# Patient Record
Sex: Male | Born: 1935 | Race: White | Hispanic: No | Marital: Married | Smoking: Former smoker
Health system: Southern US, Community
[De-identification: ages and names within clinical notes are randomized; demographics above are authoritative.]

## PROBLEM LIST (undated history)

## (undated) DIAGNOSIS — I83029 Varicose veins of left lower extremity with ulcer of unspecified site: Secondary | ICD-10-CM

## (undated) DIAGNOSIS — I83019 Varicose veins of right lower extremity with ulcer of unspecified site: Secondary | ICD-10-CM

## (undated) DIAGNOSIS — L97929 Non-pressure chronic ulcer of unspecified part of left lower leg with unspecified severity: Secondary | ICD-10-CM

## (undated) DIAGNOSIS — A389 Scarlet fever, uncomplicated: Secondary | ICD-10-CM

## (undated) DIAGNOSIS — L97919 Non-pressure chronic ulcer of unspecified part of right lower leg with unspecified severity: Secondary | ICD-10-CM

## (undated) DIAGNOSIS — A46 Erysipelas: Secondary | ICD-10-CM

## (undated) HISTORY — PX: TONSILLECTOMY: SHX5217

## (undated) HISTORY — DX: Erysipelas: A46

## (undated) HISTORY — DX: Scarlet fever, uncomplicated: A38.9

---

## 2005-06-02 ENCOUNTER — Emergency Department (HOSPITAL_COMMUNITY): Admission: EM | Admit: 2005-06-02 | Discharge: 2005-06-02 | Payer: Self-pay | Admitting: Emergency Medicine

## 2005-06-17 ENCOUNTER — Emergency Department (HOSPITAL_COMMUNITY): Admission: EM | Admit: 2005-06-17 | Discharge: 2005-06-17 | Payer: Self-pay | Admitting: Emergency Medicine

## 2012-01-01 ENCOUNTER — Telehealth: Payer: Self-pay | Admitting: Family Medicine

## 2012-01-01 ENCOUNTER — Encounter: Payer: Self-pay | Admitting: Family Medicine

## 2012-01-01 ENCOUNTER — Ambulatory Visit (INDEPENDENT_AMBULATORY_CARE_PROVIDER_SITE_OTHER): Payer: Self-pay | Admitting: Family Medicine

## 2012-01-01 VITALS — BP 120/70 | HR 68 | Temp 97.9°F | Ht 67.0 in | Wt 175.0 lb

## 2012-01-01 DIAGNOSIS — L0390 Cellulitis, unspecified: Secondary | ICD-10-CM

## 2012-01-01 DIAGNOSIS — R609 Edema, unspecified: Secondary | ICD-10-CM

## 2012-01-01 DIAGNOSIS — L0291 Cutaneous abscess, unspecified: Secondary | ICD-10-CM

## 2012-01-01 LAB — CBC WITH DIFFERENTIAL/PLATELET
HCT: 40.8 % (ref 39.0–52.0)
Hemoglobin: 13.7 g/dL (ref 13.0–17.0)
Lymphocytes Relative: 9 % — ABNORMAL LOW (ref 12–46)
MCV: 101.4 fL — ABNORMAL HIGH (ref 78.0–100.0)
Monocytes Absolute: 0.8 10*3/uL (ref 0.1–1.0)
Neutro Abs: 9.9 10*3/uL — ABNORMAL HIGH (ref 1.7–7.7)
RDW: 13.4 % (ref 11.5–15.5)
WBC: 11.7 10*3/uL — ABNORMAL HIGH (ref 4.0–10.5)

## 2012-01-01 LAB — BASIC METABOLIC PANEL
CO2: 26 mEq/L (ref 19–32)
Calcium: 9.2 mg/dL (ref 8.4–10.5)
Creat: 1 mg/dL (ref 0.50–1.35)
Potassium: 3.9 mEq/L (ref 3.5–5.3)
Sodium: 140 mEq/L (ref 135–145)

## 2012-01-01 MED ORDER — CEFTRIAXONE SODIUM 1 G IJ SOLR
1.0000 g | Freq: Once | INTRAMUSCULAR | Status: AC
  Administered 2012-01-01: 1 g via INTRAMUSCULAR

## 2012-01-01 MED ORDER — AMOXICILLIN-POT CLAVULANATE 875-125 MG PO TABS: 1.0000 | ORAL_TABLET | Freq: Two times a day (BID) | ORAL | Status: AC

## 2012-01-01 MED ORDER — FUROSEMIDE 20 MG PO TABS: 20.0000 mg | ORAL_TABLET | Freq: Every day | ORAL | Status: DC

## 2012-01-01 NOTE — Telephone Encounter (Signed)
Just an FYI

## 2012-01-01 NOTE — Patient Instructions (Signed)
You have a serious infection in your leg.  It might end up requiring hospitalization and intravenous antibiotics.  Per your request of avoiding the hospital, we will try to treat it with oral antibiotics and medication to decrease the swelling.  If your redness of leg spreads beyond the inked margins (that I drew), if you develop fevers, or vomiting and can't keep down the antibiotics, then you will need to go to the ER for treatment.  It is very important that you keep your legs elevated.  You seem to have a chronic problem with leg swelling (which contributes to the recurrent infections) and would benefit from wearing compression stockings on a regular basis.  You can get these from medical supply stores.  I would encourage you to get on Medicare, and to see a doctor on a regular basis for routine health surveillance (screening for diabetes, heart disease, immunizations, etc).  I am prescribing furosemide which is a diuretic to help get some of the fluid out of the leg.  Take a full tablet daily until the swelling is improved.  You may be able to cut back to just 1/2 tablet if you are having dizziness, low blood pressure, or when swelling has improved a lot.  Stop taking if/when swelling has completely resolved.  If your kidney function is normal (we are checking this today), then you may need to eat a banana daily in order to prevent your potassium from dropping too low.  If your blood count comes back extremely high, we may recommend that you go to the hospital for IV treatment--we will contact you with your results later today.   It is very important that you return here on Monday for a recheck of your leg, and to go to the ER sooner if the redness is spreading, fevers, etc.  Cellulitis Cellulitis is an infection of the skin and the tissue beneath it. The area is typically red and tender. It is caused by germs (bacteria) (usually staph or strep) that enter the body through cuts or sores. Cellulitis  most commonly occurs in the arms or lower legs.  HOME CARE INSTRUCTIONS   If you are given a prescription for medications which kill germs (antibiotics), take as directed until finished.   If the infection is on the arm or leg, keep the limb elevated as able.   Use a warm cloth several times per day to relieve pain and encourage healing.   See your caregiver for recheck of the infected site as directed if problems arise.   Only take over-the-counter or prescription medicines for pain, discomfort, or fever as directed by your caregiver.  SEEK MEDICAL CARE IF:   The area of redness (inflammation) is spreading, there are red streaks coming from the infected site, or if a part of the infection begins to turn dark in color.   The joint or bone underneath the infected skin becomes painful after the skin has healed.   The infection returns in the same or another area after it seems to have gone away.   A boil or bump swells up. This may be an abscess.   New, unexplained problems such as pain or fever develop.  SEEK IMMEDIATE MEDICAL CARE IF:   You have a fever.   You or your child feels drowsy or lethargic.   There is vomiting, diarrhea, or lasting discomfort or feeling ill (malaise) with muscle aches and pains.  MAKE SURE YOU:   Understand these instructions.   Will  watch your condition.   Will get help right away if you are not doing well or get worse.  Document Released: 06/11/2005 Document Revised: 08/21/2011 Document Reviewed: 04/19/2008 [facility] Patient Information 2012 [place], [place].

## 2012-01-01 NOTE — Telephone Encounter (Signed)
Called [facility] Care Management [phone] reached voice mail left order on phone for pt for Care Management advised that the pt does not have Medicare Insurance and please follow up with [place]

## 2012-01-01 NOTE — Telephone Encounter (Signed)
Patient called from [facility] , they only had Rx for him for Augmentin they have no record of Lasix.  Called Lasix in to pharmacist at [facility] per orders in chart

## 2012-01-01 NOTE — Progress Notes (Signed)
Chief complaint:  Possible cellulitis--started on 12/29/11, burning pain on his lower leg. (pt's ankles appear very swollen, left worse than right)   HPI:  While finishing his taxes on Monday (4/15), noticed increased swelling and pain in right leg (left leg has chronic swelling), and started draining a light yellow fluid.  Has been in bed with leg elevated since Monday--swelling improved some, but still draining. Pain improved some when leg elevated. Denies fevers.  Feet began swelling in 2006 after a head injury.  H/o infection in legs from rose bushes around the same time.  Has mostly involved his left leg. Gets leg infections every 1-2 years (usually in left leg).  Last saw a doctor in the fall, treated for a leg infection (erisypelas).   Doesn't have regular doctor, only sees for acute visit. Had tetanus vaccine in 2006 related to a head injury requiring staples.  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  Walks 4-6 miles daily most days (not when has a painful leg or in bad weather).  Denies chest pain, shortness of breath.  Past Medical History  Diagnosis Date  . Scarlet fever   . Erysipelas     Past Surgical History  Procedure Date  . Tonsillectomy     History   Social History  . Marital Status: Married    Spouse Name: N/A    Number of Children: N/A  . Years of Education: N/A   Occupational History  . retired from outside [employer]    Social History Main Topics  . Smoking status: Former Smoker    Quit date: 09/15/1962  . Smokeless tobacco: Never Used  . Alcohol Use: No  . Drug Use: No  . Sexually Active: Not on file   Other Topics Concern  . Not on file   Social History Narrative   Lives with wife.  2 children in [place], 1 in [place], 1 in [place]    Family History  Problem Relation Age of Onset  . Heart disease Mother   . Cancer Mother     leukemia  . Diabetes Neg Hx     Meds:  OTC vitamins and herbs.  No rx meds.  No Known  Allergies  ROS:  Denies headaches, fevers, URI symptoms, chest pain, shortness of breath, cough, abdominal complaints.  Denies nausea, vomiting, joint pains (just leg pain related to swelling/infection). Denies dizziness, syncope  PHYSICAL EXAM: BP 120/70  Pulse 68  Temp(Src) 97.9 F (36.6 C) (Oral)  Ht 5\' 7"  (1.702 m)  Wt 175 lb (79.379 kg)  BMI 27.41 kg/m2 Well appearing male, talkative, in no distress.  Very talkative, and a minimizer--"I know I'm going to be fine, it is going to get better" Neck: no lymphadenopathy or mass Heart: regular rate and rhythm without murmur Lungs: clear Abdomen: soft, nontender Extremities: 3+ edema, pitting with bright red skin, warm, and scabs/open areas on anterior shin. Erythema extends up to the knee anteriorly, but up the back of the posterior thigh.  Feet are warm with brisk capillary refill.  Left leg has 2+ edema, some chronic stasis changes with hyperpigmentation and dry flaky skin.  Some scabs present.  No erythema or warmth Neuro: alert and oriented, normal gait, grossly normal strength and cranial nerves Psych: normal affect, hygiene and grooming.  Somewhat unusual sense of humor, and uses a lot of medical terms in describe his history (not accurately).  ASSESSMENT/PLAN: 1. Cellulitis  amoxicillin-clavulanate (AUGMENTIN) 875-125 MG per tablet, CBC with Differential,  Basic metabolic panel, cefTRIAXone (ROCEPHIN) injection 1 g  2. Edema  furosemide (LASIX) 20 MG tablet   He has a very severe case of cellulitis, that based on severity and how quickly it developed, would likely best be treated with IV ABX.  Patient refuses to go to ER for treatment.  Therefore, discussed that we will do the best we can--IM Rocephin and Augmentin.  Edges of erythema marked with pen--advised him to go to ER if redness continues to spread, develops fever, or vomiting (NOT to wait for recheck Monday if worsening, but to go to hospital). Emphasized the potential risk for  worsening infection, including sepsis and death if we won't try to treat aggressively. Hopefully he will respond to treatment. CBC and b-met were checked stat today (if WBC extremely high, will try to encourage to go to hospital for IV ABX), and to see what underlying kidney function/electrolytes are in this 78 year old male with limited medical care.  WBC 11.7--okay to proceed with course as planned Electrolytes/kidney function normal.  F/u Monday, sooner prn

## 2012-01-05 ENCOUNTER — Ambulatory Visit (INDEPENDENT_AMBULATORY_CARE_PROVIDER_SITE_OTHER): Payer: Self-pay | Admitting: Family Medicine

## 2012-01-05 ENCOUNTER — Encounter: Payer: Self-pay | Admitting: Family Medicine

## 2012-01-05 ENCOUNTER — Telehealth: Payer: Self-pay | Admitting: Family Medicine

## 2012-01-05 VITALS — BP 102/60 | HR 68 | Temp 97.8°F | Resp 16 | Ht 67.0 in | Wt 172.0 lb

## 2012-01-05 DIAGNOSIS — L03119 Cellulitis of unspecified part of limb: Secondary | ICD-10-CM

## 2012-01-05 DIAGNOSIS — L02419 Cutaneous abscess of limb, unspecified: Secondary | ICD-10-CM

## 2012-01-05 NOTE — Telephone Encounter (Signed)
Michelle with [facility] called back and advised that they cannot help since the patient has no primary payer.  She recommends pt get in touch with Social Services.

## 2012-01-05 NOTE — Telephone Encounter (Signed)
Noted.  [facility] was the one who recommended we get them involved, to get a Child psychotherapist involved to get him assigned to Medicare.  At this point, is probably moot, since we advised pt of need for hospitalization, and that social workers would be in contact with him there.

## 2012-01-05 NOTE — Telephone Encounter (Signed)
Michelle with [facility] called this morning and stated that they normally do not take pt with out any insurance.  She is still trying to get in touch with patient and will try and assist him.

## 2012-01-05 NOTE — Patient Instructions (Signed)
Your infection has not responded to the antibiotics started last week. It is very important that you go to the hospital to get treated with IV antibiotics.  I have called ahead to [facility] ER to let them know you will be coming, and what needs to be done.  If this infection isn't treated, it can then spread to the bloodstream and cause death, so please follow through in getting this treated at hospital.  You will need to have social workers come speak to you while there to discuss insurance/Medicare.

## 2012-01-05 NOTE — Progress Notes (Signed)
Patient presents for f/u cellulitis RLL  He has been taking Augmentin twice daily, with only slight loose stools.  Keeping the leg elevated.  He reports the leg isn't weeping nearly as much. Has been taking the Lasix once daily, but not today yet.  Denies fevers.   Current Outpatient Prescriptions on File Prior to Visit  Medication Sig Dispense Refill  . amoxicillin-clavulanate (AUGMENTIN) 875-125 MG per tablet Take 1 tablet by mouth 2 (two) times daily.  20 tablet  0  . CINNAMON PO Take by mouth.      . furosemide (LASIX) 20 MG tablet Take 1 tablet (20 mg total) by mouth daily. Take only as needed for swelling.  If swelling significantly improved, but still present, cut back to 1/2 tablet daily.  Stop when swelling has resolved--use just as needed for recurrent swelling  15 tablet  0  . Misc Natural Product Nasal (EUPHORBIUM NA) Place into the nose. Patient states he takes the pellet form of this medication.      . Multiple Vitamin (MULTIVITAMIN) tablet Take 1 tablet by mouth daily.       No Known Allergies  ROS:  No fevers, vomting, allergic reaction.  PHYSICAL EXAM: BP 102/60  Pulse 68  Temp(Src) 97.8 F (36.6 C) (Oral)  Resp 16  Ht 5\' 7"  (1.702 m)  Wt 172 lb (78.019 kg)  BMI 26.94 kg/m2 Lower extremities: 3+ edema, with significant erythema/warmth.  Yellow crusting anteriorly, no active drainage.  No significant improvement from pen markings from last week.  ASSESSMENT/PLAN:  1. Cellulitis, leg    unchanged, not responding to oral ABX   Discussed need to go to ER and be admitted for IV antibiotics.  Will need social work consult, as pt needs to be on Medicare  Pt understands risks of noncompliance with this recommendation.

## 2012-01-07 ENCOUNTER — Emergency Department (HOSPITAL_COMMUNITY)
Admission: EM | Admit: 2012-01-07 | Discharge: 2012-01-07 | Disposition: A | Discharge status: Home / Self Care | Payer: Self-pay

## 2012-01-07 ENCOUNTER — Telehealth: Payer: Self-pay | Admitting: Family Medicine

## 2012-01-07 ENCOUNTER — Telehealth: Payer: Self-pay | Admitting: *Deleted

## 2012-01-07 NOTE — Telephone Encounter (Signed)
Received call from [facility] admitting, patient is there and states he was told we were calling to get a bed for him. Spoke to [place] and she states no that Dr. Lynelle Doctor has instructed patient to go to the ER not admitting. Informed gentleman at [facility] admitting and he asked me to talk with the patient. I informed patient that he needs to go to the ER not admitting and that the Er physicians would take care of him there. He wanted to know what they were going to do, advised him that they would evaluate him and admit him if needed. Patient argued stating that is not what Dr. Lynelle Doctor told him to do and that he doesn't have coverage for that and he better not get a bill. I informed patient that we could not admit him ad that he needs to go to the ER.

## 2012-01-07 NOTE — Telephone Encounter (Signed)
He wasn't supposed to go to admitting--please make sure that he knows he is supposed to go to ER.  He will NOT be a direct admit

## 2012-01-07 NOTE — Telephone Encounter (Signed)
Please call pt--he was advised to go to ER 2 days ago, to be admitted for cellulitis that wasn't improving on oral ABX.  I am sure that his swelling is probably better this morning (temporarily), but if the redness hasn't changed, then hospital is still recommended.  He minimized his symptoms even at his visit, so I don't trust his judgement about being "better".  I'm happy to eyeball his legs prior to his going to hospital if needed.

## 2012-01-07 NOTE — Telephone Encounter (Signed)
Spoke with patient and he wanted you to know that he just had a few errands to finish before he went to admitting. He is going to head over there shortly. I told him that we would check on his arrival as we are part of [facility]. Just an FYI.

## 2012-01-07 NOTE — Telephone Encounter (Signed)
Called patient and and let him know that he was supposed to go to the ER, as I told him earlier NOT to admitting. I told him this in my earlier phone call but he insisted that Dr.Knapp told him admitting. I left a message telling him that I did confirm with Dr.Knapp that he was to go to the ER as he was not a direct admit.

## 2012-09-25 ENCOUNTER — Inpatient Hospital Stay (HOSPITAL_COMMUNITY)
Admission: AD | Admit: 2012-09-25 | Discharge: 2012-10-07 | DRG: 603 | Disposition: A | Discharge status: Home / Self Care | Payer: Medicare Other | Source: Ambulatory Visit | Attending: Internal Medicine | Admitting: Internal Medicine

## 2012-09-25 ENCOUNTER — Inpatient Hospital Stay (HOSPITAL_COMMUNITY): Payer: Medicare Other

## 2012-09-25 ENCOUNTER — Encounter (HOSPITAL_COMMUNITY): Payer: Self-pay | Admitting: *Deleted

## 2012-09-25 ENCOUNTER — Ambulatory Visit: Payer: Self-pay | Admitting: Emergency Medicine

## 2012-09-25 VITALS — BP 104/62 | HR 70 | Temp 97.4°F | Resp 16 | Ht 68.0 in | Wt 160.0 lb

## 2012-09-25 DIAGNOSIS — L02419 Cutaneous abscess of limb, unspecified: Secondary | ICD-10-CM | POA: Diagnosis present

## 2012-09-25 DIAGNOSIS — R319 Hematuria, unspecified: Secondary | ICD-10-CM | POA: Diagnosis present

## 2012-09-25 DIAGNOSIS — M7989 Other specified soft tissue disorders: Secondary | ICD-10-CM

## 2012-09-25 DIAGNOSIS — L0390 Cellulitis, unspecified: Secondary | ICD-10-CM | POA: Insufficient documentation

## 2012-09-25 DIAGNOSIS — L0291 Cutaneous abscess, unspecified: Secondary | ICD-10-CM

## 2012-09-25 DIAGNOSIS — Z87891 Personal history of nicotine dependence: Secondary | ICD-10-CM

## 2012-09-25 DIAGNOSIS — I872 Venous insufficiency (chronic) (peripheral): Secondary | ICD-10-CM | POA: Diagnosis present

## 2012-09-25 DIAGNOSIS — I82409 Acute embolism and thrombosis of unspecified deep veins of unspecified lower extremity: Secondary | ICD-10-CM | POA: Diagnosis present

## 2012-09-25 DIAGNOSIS — A46 Erysipelas: Principal | ICD-10-CM | POA: Diagnosis present

## 2012-09-25 DIAGNOSIS — I8310 Varicose veins of unspecified lower extremity with inflammation: Secondary | ICD-10-CM | POA: Diagnosis present

## 2012-09-25 LAB — COMPREHENSIVE METABOLIC PANEL
Albumin: 3.5 g/dL (ref 3.5–5.2)
Alkaline Phosphatase: 72 U/L (ref 39–117)
BUN: 29 mg/dL — ABNORMAL HIGH (ref 6–23)
CO2: 28 mEq/L (ref 19–32)
Chloride: 102 mEq/L (ref 96–112)
Creatinine, Ser: 0.88 mg/dL (ref 0.50–1.35)
GFR calc non Af Amer: 78 mL/min — ABNORMAL LOW (ref >90)
Potassium: 4.2 mEq/L (ref 3.5–5.1)
Total Bilirubin: 0.3 mg/dL (ref 0.3–1.2)

## 2012-09-25 LAB — POCT CBC
Granulocyte percent: 71.9 %G (ref 37–80)
HCT, POC: 46.3 % (ref 43.5–53.7)
Hemoglobin: 14.5 g/dL (ref 14.1–18.1)
MCH, POC: 31.9 pg — AB (ref 27–31.2)
MCHC: 31.3 g/dL — AB (ref 31.8–35.4)
POC Granulocyte: 6.8 (ref 2–6.9)
POC MID %: 12.5 %M — AB (ref 0–12)
RBC: 4.54 M/uL — AB (ref 4.69–6.13)

## 2012-09-25 LAB — CK: Total CK: 176 U/L (ref 7–232)

## 2012-09-25 MED ORDER — SODIUM CHLORIDE 0.9 % IV SOLN
INTRAVENOUS | Status: DC
  Administered 2012-09-26: 75 mL/h via INTRAVENOUS
  Administered 2012-09-27 – 2012-10-01 (×6): via INTRAVENOUS
  Administered 2012-10-02: 75 mL/h via INTRAVENOUS

## 2012-09-25 MED ORDER — ACETAMINOPHEN 325 MG PO TABS: 650.0000 mg | ORAL_TABLET | Freq: Four times a day (QID) | ORAL | Status: DC | PRN

## 2012-09-25 MED ORDER — SODIUM CHLORIDE 0.9 % IV SOLN
750.0000 mg | Freq: Two times a day (BID) | INTRAVENOUS | Status: DC
  Administered 2012-09-26 – 2012-09-29 (×7): 750 mg via INTRAVENOUS
  Filled 2012-09-25 (×7): qty 750

## 2012-09-25 MED ORDER — OXYCODONE HCL 5 MG PO TABS
5.0000 mg | ORAL_TABLET | ORAL | Status: DC | PRN
  Administered 2012-09-25 – 2012-10-07 (×8): 5 mg via ORAL
  Filled 2012-09-25 (×8): qty 1

## 2012-09-25 MED ORDER — HEPARIN SODIUM (PORCINE) 5000 UNIT/ML IJ SOLN
5000.0000 [IU] | Freq: Three times a day (TID) | INTRAMUSCULAR | Status: DC
  Administered 2012-09-25 – 2012-09-26 (×2): 5000 [IU] via SUBCUTANEOUS
  Filled 2012-09-25 (×5): qty 1

## 2012-09-25 MED ORDER — ACETAMINOPHEN 650 MG RE SUPP: 650.0000 mg | Freq: Four times a day (QID) | RECTAL | Status: DC | PRN

## 2012-09-25 MED ORDER — VANCOMYCIN HCL IN DEXTROSE 1-5 GM/200ML-% IV SOLN
1000.0000 mg | Freq: Once | INTRAVENOUS | Status: AC
  Administered 2012-09-25: 1000 mg via INTRAVENOUS
  Filled 2012-09-25: qty 200

## 2012-09-25 NOTE — Progress Notes (Signed)
[facility] [address] [place] [address] [phone]  Date:  09/25/2012   Name:  Justin [name]   DOB:  05-Dec-1927   MRN:  [id number]  PCP:  No primary provider on file.    Chief Complaint: Cellulitis   History of Present Illness:  Justin [name] is a 79 y.o. very pleasant male patient who presents with the following:  Years long history of "leg circulation problems" since 2006 and has experienced repeated bouts of cellulitis and claims adverse reactions to both amoxicillin and levaquin.  Was "normal" in November and again developed an erythematous rash on his arms and trunk that he is treating with an OTC hemorrhoidal cream that he buys in [place].  Is a homeopath.  Has no fever or chills, nausea or vomiting.  Voices inappropriate arguments regarding admission as he will be "ready" for admission Tuesday or Wednesday and that if this is not cared for properly [facility]. Using neosporin on the leg and argues that it could not have an allergic component to Neomycin as it "relieves the pain in his leg".    There is no problem list on file for this patient.   Past Medical History  Diagnosis Date  . Scarlet fever   . Erysipelas     Past Surgical History  Procedure Date  . Tonsillectomy     History  Substance Use Topics  . Smoking status: Former Smoker    Quit date: 09/15/1962  . Smokeless tobacco: Never Used  . Alcohol Use: No    Family History  Problem Relation Age of Onset  . Heart disease Mother   . Cancer Mother     leukemia  . Diabetes Neg Hx     No Known Allergies  Medication list has been reviewed and updated.  Current Outpatient Prescriptions on File Prior to Visit  Medication Sig Dispense Refill  . Multiple Vitamin (MULTIVITAMIN) tablet Take 1 tablet by mouth daily.      Marland Kitchen CINNAMON PO Take by mouth.      . furosemide (LASIX) 20 MG tablet Take 1 tablet (20 mg total) by mouth daily. Take only as needed  for swelling.  If swelling significantly improved, but still present, cut back to 1/2 tablet daily.  Stop when swelling has resolved--use just as needed for recurrent swelling  15 tablet  0  . Misc Natural Product Nasal (EUPHORBIUM NA) Place into the nose. Patient states he takes the pellet form of this medication.        Review of Systems:  As per HPI, otherwise negative.    Physical Examination: Filed Vitals:   09/25/12 0958  BP: 104/62  Pulse: 70  Temp: 97.4 F (36.3 C)  Resp: 16   Filed Vitals:   09/25/12 0958  Height: 5\' 8"  (1.727 m)  Weight: 160 lb (72.576 kg)   Body mass index is 24.33 kg/(m^2). Ideal Body Weight: Weight in (lb) to have BMI = 25: 164.1    GEN: WDWN, NAD, Non-toxic, Alert & Oriented x 3 HEENT: Atraumatic, Normocephalic.  Ears and Nose: No external deformity. EXTR: No clubbing/cyanosis.  Cellulitis right lower leg with erythema thigh and rash of hands.  Thigh component is scaly and not wet and weepy as the lower leg.  Not able to feel pulses in foot.   NEURO: antalgic gait.  PSYCH: Normally interactive. Conversant. Not depressed or anxious appearing.  Calm demeanor.    Assessment and Plan:  Cellulitis leg Allergic rash to amoxicillin Plan admit to [facility].  Carmelina Dane, MD  Results for orders placed in visit on 09/25/12  POCT CBC      Component Value Range   WBC 9.4  4.6 - 10.2 K/uL   Lymph, poc 1.5  0.6 - 3.4   POC LYMPH PERCENT 15.6  10 - 50 %L   MID (cbc) 1.2 (*) 0 - 0.9   POC MID % 12.5 (*) 0 - 12 %M   POC Granulocyte 6.8  2 - 6.9   Granulocyte percent 71.9  37 - 80 %G   RBC 4.54 (*) 4.69 - 6.13 M/uL   Hemoglobin 14.5  14.1 - 18.1 g/dL   HCT, POC 16.1  09.6 - 53.7 %   MCV 101.9 (*) 80 - 97 fL   MCH, POC 31.9 (*) 27 - 31.2 pg   MCHC 31.3 (*) 31.8 - 35.4 g/dL   RDW, POC 04.5     Platelet Count, POC 250  142 - 424 K/uL   MPV 8.2  0 - 99.8 fL

## 2012-09-25 NOTE — H&P (Signed)
[facility] History and Physical  Justin [name] WUJ:[id number] DOB: 1927/10/07 DOA: 09/25/2012   PCP: No primary provider on file.   Chief Complaint: right leg pain  HPI:  79 year old male with no major medical problems presents with concerns of right leg pain and cellulitis. The patient has had a very protracted history. Apparently, the patient has had multiple courses antibiotics for "cellulitis" of the right lower extremity which have included doxycycline, clindamycin, Augmentin, cephalexin, and Levaquin.  The patient has had right lower extremity erythema and swelling since the beginning of 2013. Please note that during these previous episodes, the patient has never had any fevers, chills, or actual pain in his right lower extremity. In early November 2013, the patient was given a course of Augmentin for cellulitis of his right lower extremity again. Even prior to this course of antibiotic, the patient has had edema, erythema of his right lower extremity as well as some patchy areas of erythema on his right upper extremity. After taking the Augmentin 13 days, the patient noted increasing scaling, erythema, and scaly patches on his right lower extremity and right upper extremity. The patient was given clindamycin and then prednisone. This did not seem to help much. His erythematous patches on his right arm and edema and erythema on his right leg continued to progress despite prednisone. The patient was given levofloxacin on 08/18/2012. He stated that after one dose of the Levaquin, his scaly erythematous patches on his right upper extremity spread to his torso, flank, left upper extremity. In addition, the erythema and scaling in his right lower extremity also worsened.  Since that period of time, the patient has been self-medicating himself with topical Neosporin as well as Preparation H with Biotin which he obtains from [place]. He states that this has not helped much. He is been using these  topicals mainly for the complaint of pruritus. On most days, the patient experiences pruritus on his arms, torso, and bilateral thighs. Please note the patient is not having significant pain except that in his ankle. He denies any recent injury or trauma. Since the Augmentin and Levaquin, the patient has noted increasing clear drainage from his right lower extremity without odor or pus. On 07/23/2012, the patient also had a nonsurgical/superficial culture performed on his right lower extremity drainage. It grew group A streptococcus and pseudomonas aeruginosa. Please note that during all his recurrent episodes of his "cellulitis" but the patient has never had any systemic toxicity, fever, chills, rigors, nausea, vomiting, diarrhea. In fact, the patient states that outside of the frustration from his right lower extremity and other rashes, he feels "pretty good". Patient denies any exotic travels. He only has one dog without any other exotic pets. Assessment/Plan: Erythema, edema of the right lower extremity -I am not convinced that all of the patient's previous presented to cellulitis, erysipelas -Blood cultures x2 sets -X-rays of the right ankle and foot -May need MRI of the right ankle and foot -Check sedimentation rate -I am concerned that most of the findings on exam are present a primary dermatologic problem versus autoimmune illness such as leukocytoclastic vasculitis or pemphigoid -Many of the dermatologic changes of his right lower extremity can also be explained from venous stasis dermatitis -Patient has never had systemic toxicity to suggest systemic GAS infection -Check ANA, C3, C4, CH50 -CPK, CMP, CBC -Start patient on empiric vancomycin until infection can be ruled out -Ultimately, the patient may need skin biopsies to verify his diagnosis which can be performed as  an outpatient -Venous Dopplers to rule out DVT -If infectious process has been ruled out, patient may need CT abdomen and  pelvis to rule out obstructive process affecting lymphatic/venous drainage -Check urine drug screen, urinalysis to rule out microscopic hematuria       Past Medical History  Diagnosis Date  . Scarlet fever   . Erysipelas    Past Surgical History  Procedure Date  . Tonsillectomy    Social History:  reports that he quit smoking about 50 years ago. He has never used smokeless tobacco. He reports that he does not drink alcohol or use illicit drugs.   Family History  Problem Relation Age of Onset  . Heart disease Mother   . Cancer Mother     leukemia  . Diabetes Neg Hx      No Known Allergies    Prior to Admission medications   Medication Sig Start Date End Date Taking? Authorizing Provider  CINNAMON PO Take 1 tablet by mouth daily.    Yes Historical Provider, MD  Multiple Vitamin (MULTIVITAMIN) tablet Take 1 tablet by mouth daily.   Yes Historical Provider, MD  OVER THE COUNTER MEDICATION Pt takes a lot of herbal supplements. Cant confirm the meds at this time on what they are.   Yes Historical Provider, MD    Review of Systems:  Constitutional:  No weight loss, night sweats, Fevers, chills, fatigue.  Head&Eyes: No headache.  No vision loss.  No eye pain or scotoma ENT:  No Difficulty swallowing,Tooth/dental problems,Sore throat,  No ear ache, post nasal drip,  Cardio-vascular:  No chest pain, Orthopnea, PND, swelling in lower extremities,  dizziness, palpitations  GI:  No  abdominal pain, nausea, vomiting, diarrhea, loss of appetite, hematochezia, melena, heartburn, indigestion, Resp:  No shortness of breath with exertion or at rest. No cough. No coughing up of blood .No wheezing.No chest wall deformity  Skin:  no rash or lesions.  GU:  no dysuria, change in color of urine, no urgency or frequency. No flank pain.  Musculoskeletal:  No joint pain or swelling. No decreased range of motion. No back pain.  Psych:  No change in mood or affect. No depression or  anxiety. Neurologic: No headache, no dysesthesia, no focal weakness, no vision loss. No syncope  Physical Exam: Filed Vitals:   09/25/12 1655  BP: 143/99  Pulse: 86  Temp: 98.3 F (36.8 C)  TempSrc: Oral  Resp: 16  SpO2: 100%   General:  A&O x 3, NAD, nontoxic, pleasant/cooperative Head/Eye: No conjunctival hemorrhage, no icterus, [place]/AT, No nystagmus ENT:  No icterus,  No thrush, good dentition, no pharyngeal exudate Neck:  No masses, no lymphadenpathy, no bruits CV:  RRR, no rub, no gallop, no S3 Lung:  CTAB, good air movement, no wheeze, no rhonchi Abdomen: soft/NT, +BS, nondistended, no peritoneal signs Ext:  3+ right lower extremity edema. Right lower extremity below the knee with nonblanching erythema with shiny skin--no necrosis, no pus, no crepitance. On the patient's bilateral upper extremities, flank, trunk there are multiple scaly erythematous plaques without any open wounds, tenderness to palpation.   Labs on Admission:  Basic Metabolic Panel: No results found for this basename: NA:5,K:2,CL:5,CO2:5,GLUCOSE:5,BUN:5,CREATININE:5,CALCIUM:5,MG:5,PHOS:5 in the last 168 hours Liver Function Tests: No results found for this basename: AST:5,ALT:5,ALKPHOS:5,BILITOT:5,PROT:5,ALBUMIN:5 in the last 168 hours No results found for this basename: LIPASE:5,AMYLASE:5 in the last 168 hours No results found for this basename: AMMONIA:5 in the last 168 hours CBC:  Lab 09/25/12 1124  WBC 9.4  NEUTROABS --  HGB 14.5  HCT 46.3  MCV 101.9*  PLT --   Cardiac Enzymes: No results found for this basename: CKTOTAL:5,CKMB:5,CKMBINDEX:5,TROPONINI:5 in the last 168 hours BNP: No components found with this basename: POCBNP:5 CBG: No results found for this basename: GLUCAP:5 in the last 168 hours  Radiological Exams on Admission: No results found.      Time spent:90 minutes Code Status:   Full Family Communication:   Family at bedside   Alexiz Cothran, DO  [facility] Pager  [phone]  If 7PM-7AM, please contact night-coverage [email or web address] Password [facility] 09/25/2012, 6:00 PM

## 2012-09-25 NOTE — Progress Notes (Addendum)
ANTIBIOTIC CONSULT NOTE - INITIAL  Pharmacy Consult for Vancomycin Indication: Cellulitis  No Known Allergies  Patient Measurements:    Vital Signs: Temp: 98.3 F (36.8 C) (01/11 1655) Temp src: Oral (01/11 1655) BP: 143/99 mmHg (01/11 1655) Pulse Rate: 86  (01/11 1655)  Labs:  Basename 09/25/12 1124  WBC 9.4  HGB 14.5  PLT --  LABCREA --  CREATININE --   The CrCl is unknown because both a height and weight (above a minimum accepted value) are required for this calculation.  Microbiology: No results found for this or any previous visit (from the past 720 hour(s)).  Medical History: Past Medical History  Diagnosis Date  . Scarlet fever   . Erysipelas     Medications:  Scheduled:    . heparin  5,000 Units Subcutaneous Q8H   Infusions:    . sodium chloride     Assessment:  84 YOM admit 1/11 with concern for cellulitis.  Hx of leg circulation problems and recurrent cellulitis and multiple courses of antibiotics.  Renal function - no chemistry panel yet today.  Will order.  Blood cultures are pending collection - antibiotics to be started after blood cultures obtained.  Goal of Therapy:  Vancomycin trough level 10-15 mcg/ml  Plan:   Vancomycin 1g now when SCr available will order subsequent dosing (labs pending collection)  Measure Vanc trough at steady state.  Follow up renal fxn and culture results.    Hessie Knows, PharmD, BCPS Pager [phone] 09/25/2012 8:20 PM    --------------------------------------------------------------------------------------- Addendum:  Labs: Scr 0.88, CrCl 63 ml/min  A/P: Based on current weight and renal function, will start vancomycin 750mg  IV q12 after 1g x 1 dose already sent.   Hessie Knows, PharmD, BCPS Pager [phone] 09/25/2012 8:31 PM

## 2012-09-26 ENCOUNTER — Inpatient Hospital Stay (HOSPITAL_COMMUNITY): Payer: Medicare Other

## 2012-09-26 DIAGNOSIS — M79609 Pain in unspecified limb: Secondary | ICD-10-CM

## 2012-09-26 DIAGNOSIS — R319 Hematuria, unspecified: Secondary | ICD-10-CM | POA: Diagnosis present

## 2012-09-26 DIAGNOSIS — I8310 Varicose veins of unspecified lower extremity with inflammation: Secondary | ICD-10-CM

## 2012-09-26 LAB — CBC WITH DIFFERENTIAL/PLATELET
Basophils Absolute: 0.1 10*3/uL (ref 0.0–0.1)
Basophils Relative: 1 % (ref 0–1)
Eosinophils Absolute: 2.2 10*3/uL — ABNORMAL HIGH (ref 0.0–0.7)
Eosinophils Relative: 24 % — ABNORMAL HIGH (ref 0–5)
HCT: 32.8 % — ABNORMAL LOW (ref 39.0–52.0)
Hemoglobin: 11.1 g/dL — ABNORMAL LOW (ref 13.0–17.0)
Lymphocytes Relative: 14 % (ref 12–46)
Lymphs Abs: 1.2 10*3/uL (ref 0.7–4.0)
MCH: 33 pg (ref 26.0–34.0)
MCHC: 33.8 g/dL (ref 30.0–36.0)
MCV: 97.6 fL (ref 78.0–100.0)
Monocytes Absolute: 1 10*3/uL (ref 0.1–1.0)
Monocytes Relative: 12 % (ref 3–12)
Neutro Abs: 4.3 10*3/uL (ref 1.7–7.7)
Neutrophils Relative %: 49 % (ref 43–77)
Platelets: 165 10*3/uL (ref 150–400)
RBC: 3.36 MIL/uL — ABNORMAL LOW (ref 4.22–5.81)
RDW: 14.3 % (ref 11.5–15.5)
WBC: 8.9 10*3/uL (ref 4.0–10.5)

## 2012-09-26 LAB — RAPID URINE DRUG SCREEN, HOSP PERFORMED
Amphetamines: NOT DETECTED
Benzodiazepines: NOT DETECTED
Opiates: NOT DETECTED
Tetrahydrocannabinol: NOT DETECTED

## 2012-09-26 LAB — COMPREHENSIVE METABOLIC PANEL
ALT: 13 U/L (ref 0–53)
AST: 24 U/L (ref 0–37)
Albumin: 2.6 g/dL — ABNORMAL LOW (ref 3.5–5.2)
Alkaline Phosphatase: 52 U/L (ref 39–117)
BUN: 26 mg/dL — ABNORMAL HIGH (ref 6–23)
CO2: 27 mEq/L (ref 19–32)
Calcium: 8 mg/dL — ABNORMAL LOW (ref 8.4–10.5)
Chloride: 104 mEq/L (ref 96–112)
Creatinine, Ser: 0.83 mg/dL (ref 0.50–1.35)
GFR calc Af Amer: >90 mL/min (ref >90)
GFR calc non Af Amer: 80 mL/min — ABNORMAL LOW (ref >90)
Glucose, Bld: 86 mg/dL (ref 70–99)
Potassium: 4 mEq/L (ref 3.5–5.1)
Sodium: 138 mEq/L (ref 135–145)
Total Bilirubin: 0.4 mg/dL (ref 0.3–1.2)
Total Protein: 4.8 g/dL — ABNORMAL LOW (ref 6.0–8.3)

## 2012-09-26 LAB — URINALYSIS, MICROSCOPIC ONLY
Bilirubin Urine: NEGATIVE
Glucose, UA: NEGATIVE mg/dL
Hgb urine dipstick: NEGATIVE
Ketones, ur: 15 mg/dL — AB
Nitrite: NEGATIVE
Protein, ur: 30 mg/dL — AB
Specific Gravity, Urine: 1.026 (ref 1.005–1.030)
Urobilinogen, UA: 0.2 mg/dL (ref 0.0–1.0)
pH: 8 (ref 5.0–8.0)

## 2012-09-26 MED ORDER — DIPHENHYDRAMINE HCL 25 MG PO CAPS
25.0000 mg | ORAL_CAPSULE | Freq: Four times a day (QID) | ORAL | Status: DC | PRN
  Administered 2012-09-26 – 2012-10-06 (×17): 25 mg via ORAL
  Filled 2012-09-26 (×17): qty 1

## 2012-09-26 MED ORDER — ENOXAPARIN SODIUM 100 MG/ML ~~LOC~~ SOLN
100.0000 mg | SUBCUTANEOUS | Status: DC
  Administered 2012-09-26 – 2012-09-28 (×3): 100 mg via SUBCUTANEOUS
  Filled 2012-09-26 (×4): qty 1

## 2012-09-26 NOTE — Progress Notes (Signed)
Doppler complete. Preliminary results paged to Dr Elisabeth Pigeon.

## 2012-09-26 NOTE — Progress Notes (Addendum)
ANTICOAGULATION CONSULT NOTE - Initial Consult  Pharmacy Consult for Lovenox Indication: DVT  No Known Allergies  Patient Measurements: Height: 5\' 8"  (172.7 cm) Weight: 162 lb 11.2 oz (73.8 kg) IBW/kg (Calculated) : 68.4   Vital Signs: Temp: 97.9 F (36.6 C) (01/12 1400) Temp src: Oral (01/12 1400) BP: 124/69 mmHg (01/12 1400) Pulse Rate: 77  (01/12 1400)  Labs:  Basename 09/26/12 0435 09/25/12 1950 09/25/12 1124  HGB 11.1* -- 14.5  HCT 32.8* -- 46.3  PLT 165 -- --  APTT -- -- --  LABPROT -- -- --  INR -- -- --  HEPARINUNFRC -- -- --  CREATININE 0.83 0.88 --  CKTOTAL -- 176 --  CKMB -- -- --  TROPONINI -- -- --   Estimated Creatinine Clearance: 66.4 ml/min (by C-G formula based on Cr of 0.83).  Medical History: Past Medical History  Diagnosis Date  . Scarlet fever   . Erysipelas    Medications:  Scheduled:    . vancomycin  750 mg Intravenous Q12H  . [COMPLETED] vancomycin  1,000 mg Intravenous Once  . [DISCONTINUED] heparin  5,000 Units Subcutaneous Q8H    Assessment: 36 yoM admitted with probable cellulitis, hx of multiple antibiotics and circulation deficit to LE's.  LE doppler positive for L DVT  Was on SQ Heparin for DVT prophylaxis, which was stopped after 6am dose this am for hematuria with clots noted.   Hgb on admit 14.5-> 11.1, Plt 165 today  Concern for continued hematuria, will use dose of 1.64m/kg q24 for treatment dose Lovenox  Goals of Therapy: Anti-Xa level 0.6-1.2 units/ml 4hrs after LMWH dose given Monitor platelets by anticoagulation protocol: Yes   Plan:  Lovenox 100mg  SQ q24 hr, Daily CBC Will need decision on Warfarin or other medication for long-term anti-coagulation.  Otho Bellows PharmD Pager [phone] 09/26/2012,4:36 PM

## 2012-09-26 NOTE — Progress Notes (Signed)
VASCULAR LAB PRELIMINARY  PRELIMINARY  PRELIMINARY  PRELIMINARY  Bilateral lower extremity venous duplex  completed.    Preliminary report:  Right:  No evidence of DVT, superficial thrombosis, or Baker's cyst.  Left: DVT noted in the distal CFV, origin of the PFV and throughout the FV.  Pop v and calf veins patent.  No evidence of superficial thrombosis.  No Baker's cyst.  Javell Blackburn, RVT 09/26/2012, 3:01 PM

## 2012-09-26 NOTE — Progress Notes (Addendum)
[facility] PROGRESS NOTE  Justin [name] JXB:[id number] DOB: [birth year] DOA: 09/25/2012 PCP: No primary provider on file.  Brief narrative: 79 year old male with no significant past medical history who presented with right lower extremity swelling (chronic) on multiple antibiotics PO, difficulty ambulation. In addition patient reported hematuria. No fevers, no nausea or vomiting. In ED, no significant findings on lab work up but on physical exam right lower extremity very red and painful on palpation.Patient started empirically on vancomycin.  Assessment/Plan:  Principal Problem:  *Hematuria  Obtain renal ultrasound and urinalysis  Hemoglobin stable Active Problems:  Cellulitis / Venous stasis dermatitis  Continue vancomycin  Appreciate wound care consult  Rule out clot with lower extremity venous doppler   Code Status: full code Family Communication: no family at bedside Disposition Plan: home when stable  Manson Passey, MD  [facility] Pager [phone]  If 7PM-7AM, please contact night-coverage [email or web address] Password [email or web address] 09/26/2012, 2:40 PM   LOS: 1 day   Consultants:  None   Procedures:  None   Antibiotics:  vanco 09/25/2012 -->  HPI/Subjective: Reports blood in urine  Objective: Filed Vitals:   09/25/12 1655 09/25/12 1900 09/25/12 2100  BP: 143/99  136/68  Pulse: 86  88  Temp: 98.3 F (36.8 C)  98.4 F (36.9 C)  TempSrc: Oral  Oral  Resp: 16  18  Height:  5\' 8"  (1.727 m)   Weight:  73.8 kg (162 lb 11.2 oz)   SpO2: 100%  100%    Intake/Output Summary (Last 24 hours) at 09/26/12 1440 Last data filed at 09/26/12 1336  Gross per 24 hour  Intake   1775 ml  Output    400 ml  Net   1375 ml    Exam:   General:  Pt is alert, follows commands appropriately, not in acute distress  Cardiovascular: Regular rate and rhythm, S1/S2, no murmurs, no rubs, no gallops  Respiratory: Clear to auscultation bilaterally, no wheezing, no crackles, no  rhonchi  Abdomen: Soft, non tender, non distended, bowel sounds present, no guarding  Extremities: right lower extremity erythematous, swollen with sloughing  Neuro: Grossly nonfocal  Data Reviewed: Basic Metabolic Panel:  Lab 09/26/12 8469 09/25/12 1950  NA 138 140  K 4.0 4.2  CL 104 102  CO2 27 28  GLUCOSE 86 116*  BUN 26* 29*  CREATININE 0.83 0.88  CALCIUM 8.0* 9.1  MG -- --  PHOS -- --   Liver Function Tests:  Lab 09/26/12 0435 09/25/12 1950  AST 24 31  ALT 13 18  ALKPHOS 52 72  BILITOT 0.4 0.3  PROT 4.8* 6.4  ALBUMIN 2.6* 3.5   No results found for this basename: LIPASE:5,AMYLASE:5 in the last 168 hours No results found for this basename: AMMONIA:5 in the last 168 hours CBC:  Lab 09/26/12 0435 09/25/12 1124  WBC 8.9 9.4  NEUTROABS 4.3 --  HGB 11.1* 14.5  HCT 32.8* 46.3  MCV 97.6 101.9*  PLT 165 --   Cardiac Enzymes:  Lab 09/25/12 1950  CKTOTAL 176  CKMB --  CKMBINDEX --  TROPONINI --   BNP: No components found with this basename: POCBNP:5 CBG: No results found for this basename: GLUCAP:5 in the last 168 hours  Recent Results (from the past 240 hour(s))  CULTURE, BLOOD (ROUTINE X 2)     Status: Normal (Preliminary result)   Collection Time   09/25/12  7:45 PM      Component Value Range Status Comment   Specimen Description BLOOD LEFT FOREARM  3 ML IN [facility] BOTTLE   Final    Special Requests NONE   Final    Culture  Setup Time 10/23/12 22:59   Final    Culture     Final    Value:        BLOOD CULTURE RECEIVED NO GROWTH TO DATE CULTURE WILL BE HELD FOR 5 DAYS BEFORE ISSUING A FINAL NEGATIVE REPORT   Report Status PENDING   Incomplete   CULTURE, BLOOD (ROUTINE X 2)     Status: Normal (Preliminary result)   Collection Time   October 23, 2012  7:50 PM      Component Value Range Status Comment   Specimen Description BLOOD RIGHT FOREARM  1 ML IN AEROBIC ONLY   Final    Special Requests NONE   Final    Culture  Setup Time 2012-10-23 22:59   Final     Culture     Final    Value:        BLOOD CULTURE RECEIVED NO GROWTH TO DATE CULTURE WILL BE HELD FOR 5 DAYS BEFORE ISSUING A FINAL NEGATIVE REPORT   Report Status PENDING   Incomplete      Studies: Dg Ankle 2 Views Right  23-Oct-2012  *RADIOLOGY REPORT*  Clinical Data: 79 year old male with edema pain cellulitis.  RIGHT ANKLE - 2 VIEW  Comparison: None.  Findings: Moderate to severe generalized soft tissue swelling about the visible foot and ankle.  The findings may be maximal at the dorsum of the foot.  Mortise joint alignment preserved. Bone mineralization is within normal limits.  Calcaneus intact.  No fracture identified.  No definite subcutaneous gas or retained foreign body identified.  IMPRESSION: Severe soft tissue swelling, appears maximal at the dorsum of the foot. No acute fracture or dislocation identified about the right ankle.   Original Report Authenticated By: Erskine Speed, M.D.    Dg Foot 2 Views Right  10-23-2012  *RADIOLOGY REPORT*  Clinical Data: 79 year old male pain swelling erythema cellulitis.  RIGHT FOOT - 2 VIEW  Comparison: Right ankle series from the same day.  Findings: Severe soft tissue swelling about the foot, especially the dorsum of the foot at the metatarsal level.  No definite subcutaneous gas. No radiopaque foreign body identified. Underlying bone mineralization within normal limits.  No osteolysis, fracture or dislocation identified.  IMPRESSION: Severe soft tissue swelling.  No underlying acute osseous abnormality identified in the right foot.   Original Report Authenticated By: Erskine Speed, M.D.     Scheduled Meds:   . vancomycin  750 mg Intravenous Q12H   Continuous Infusions:   . sodium chloride 75 mL/hr (09/26/12 1148)

## 2012-09-27 LAB — CBC
MCH: 32.7 pg (ref 26.0–34.0)
MCV: 97 fL (ref 78.0–100.0)
Platelets: 167 10*3/uL (ref 150–400)
RDW: 14.2 % (ref 11.5–15.5)

## 2012-09-27 LAB — C4 COMPLEMENT: Complement C4, Body Fluid: 24 mg/dL (ref 10–40)

## 2012-09-27 MED ORDER — TAMSULOSIN HCL 0.4 MG PO CAPS
0.4000 mg | ORAL_CAPSULE | Freq: Every day | ORAL | Status: DC
  Administered 2012-09-29 – 2012-10-03 (×4): 0.4 mg via ORAL
  Filled 2012-09-27 (×8): qty 1

## 2012-09-27 MED ORDER — HYDROCERIN EX CREA
TOPICAL_CREAM | Freq: Two times a day (BID) | CUTANEOUS | Status: DC
  Administered 2012-09-27 – 2012-10-03 (×9): via TOPICAL
  Administered 2012-10-03: 1 via TOPICAL
  Administered 2012-10-04 – 2012-10-07 (×6): via TOPICAL
  Filled 2012-09-27: qty 113
  Filled 2012-09-27: qty 454

## 2012-09-27 NOTE — Progress Notes (Addendum)
[facility] PROGRESS NOTE  CYPHER PAULE NWG:[id number] DOB: Aug 12, 1928 DOA: 09/25/2012 PCP: No primary provider on file.  Brief narrative: 79 year old male with no significant past medical history who presented with right lower extremity swelling (chronic) on multiple antibiotics PO, difficulty ambulation. In addition patient reported hematuria. No fevers, no nausea or vomiting.  In ED, no significant findings on lab work up but on physical exam right lower extremity very red and painful on palpation.Patient started empirically on vancomycin. Large right venous Doppler showed DVT in left lower extremity.  Assessment/Plan: .  Principal Problem:  *Hematuria  Obtain renal ultrasound and urinalysis  Hemoglobin stable  Active Problems:  Cellulitis / Venous stasis dermatitis  Continue vancomycin  Appreciate wound care consult  Lower stranding to Doppler shows DVT in left lower extremity Lovenox started No blood in urine reported   Code Status: full code  Family Communication: no family at bedside  Disposition Plan: home when stable   Manson Passey, MD  [facility]  Pager [phone]   Consultants:  None  Procedures:  None  Antibiotics:  vanco 09/25/2012 -->  If 7PM-7AM, please contact night-coverage [email or web address] Password [facility] 09/27/2012, 4:38 PM   LOS: 2 days   HPI/Subjective: No acute overnight events. No further blood in urine reported.  Objective: Filed Vitals:   09/26/12 1400 09/26/12 2120 09/27/12 0602 09/27/12 1400  BP: 124/69 111/66 121/74 119/87  Pulse: 77 89 70 83  Temp: 97.9 F (36.6 C) 98.2 F (36.8 C) 97.6 F (36.4 C) 98.1 F (36.7 C)  TempSrc: Oral Oral Oral Oral  Resp: 18 18 18 18   Height:      Weight:      SpO2: 96% 95% 96% 98%    Intake/Output Summary (Last 24 hours) at 09/27/12 1638 Last data filed at 09/27/12 1400  Gross per 24 hour  Intake   1680 ml  Output    900 ml  Net    780 ml    Exam:   General:  Pt is alert, follows commands  appropriately, not in acute distress  Cardiovascular: Regular rate and rhythm, S1/S2, no murmurs, no rubs, no gallops  Respiratory: Clear to auscultation bilaterally, no wheezing, no crackles, no rhonchi  Abdomen: Soft, non tender, non distended, bowel sounds present, no guarding  Extremities: Right lower extremity edema with chronic skin changes and cellulitis, pulses DP and PT palpable bilaterally  Neuro: Grossly nonfocal  Data Reviewed: Basic Metabolic Panel:  Lab 09/26/12 2952 09/25/12 1950  NA 138 140  K 4.0 4.2  CL 104 102  CO2 27 28  GLUCOSE 86 116*  BUN 26* 29*  CREATININE 0.83 0.88  CALCIUM 8.0* 9.1  MG -- --  PHOS -- --   Liver Function Tests:  Lab 09/26/12 0435 09/25/12 1950  AST 24 31  ALT 13 18  ALKPHOS 52 72  BILITOT 0.4 0.3  PROT 4.8* 6.4  ALBUMIN 2.6* 3.5   No results found for this basename: LIPASE:5,AMYLASE:5 in the last 168 hours No results found for this basename: AMMONIA:5 in the last 168 hours CBC:  Lab 09/27/12 0404 09/26/12 0435 09/25/12 1124  WBC 7.4 8.9 9.4  NEUTROABS -- 4.3 --  HGB 11.0* 11.1* 14.5  HCT 32.6* 32.8* 46.3  MCV 97.0 97.6 101.9*  PLT 167 165 --   Cardiac Enzymes:  Lab 09/25/12 1950  CKTOTAL 176  CKMB --  CKMBINDEX --  TROPONINI --   BNP: No components found with this basename: POCBNP:5 CBG: No results found for this  basename: GLUCAP:5 in the last 168 hours  Recent Results (from the past 240 hour(s))  CULTURE, BLOOD (ROUTINE X 2)     Status: Normal (Preliminary result)   Collection Time   09/30/12  7:45 PM      Component Value Range Status Comment   Specimen Description BLOOD LEFT FOREARM  3 ML IN [facility] BOTTLE   Final    Special Requests NONE   Final    Culture  Setup Time 30-Sep-2012 22:59   Final    Culture     Final    Value:        BLOOD CULTURE RECEIVED NO GROWTH TO DATE CULTURE WILL BE HELD FOR 5 DAYS BEFORE ISSUING A FINAL NEGATIVE REPORT   Report Status PENDING   Incomplete   CULTURE, BLOOD (ROUTINE X  2)     Status: Normal (Preliminary result)   Collection Time   September 30, 2012  7:50 PM      Component Value Range Status Comment   Specimen Description BLOOD RIGHT FOREARM  1 ML IN AEROBIC ONLY   Final    Special Requests NONE   Final    Culture  Setup Time 2012/09/30 22:59   Final    Culture     Final    Value:        BLOOD CULTURE RECEIVED NO GROWTH TO DATE CULTURE WILL BE HELD FOR 5 DAYS BEFORE ISSUING A FINAL NEGATIVE REPORT   Report Status PENDING   Incomplete      Studies: Dg Ankle 2 Views Right  Sep 30, 2012  *RADIOLOGY REPORT*  Clinical Data: 79 year old male with edema pain cellulitis.  RIGHT ANKLE - 2 VIEW  Comparison: None.  Findings: Moderate to severe generalized soft tissue swelling about the visible foot and ankle.  The findings may be maximal at the dorsum of the foot.  Mortise joint alignment preserved. Bone mineralization is within normal limits.  Calcaneus intact.  No fracture identified.  No definite subcutaneous gas or retained foreign body identified.  IMPRESSION: Severe soft tissue swelling, appears maximal at the dorsum of the foot. No acute fracture or dislocation identified about the right ankle.   Original Report Authenticated By: Erskine Speed, M.D.    US Renal  09/26/2012  *RADIOLOGY REPORT*  Clinical Data: Hematuria.  RENAL/URINARY TRACT ULTRASOUND COMPLETE  Comparison:  None.  Findings:  Right Kidney:  Normal.  10.0 cm in length.  Left Kidney:  Normal.  10.1 cm in length.  Bladder:  Normal.  Bilateral ureteral jets identified.  The prostate gland is enlarged measuring 7.3 x 7.2 x 6.2 cm.  12 mm calcification in the prostate.  IMPRESSION:  1.  Normal kidneys. 2.  No renal calculi or hydronephrosis. 3.  Enlarged prostate gland.   Original Report Authenticated By: Francene Boyers, M.D.    Dg Foot 2 Views Right  09-30-2012  *RADIOLOGY REPORT*  Clinical Data: 79 year old male pain swelling erythema cellulitis.  RIGHT FOOT - 2 VIEW  Comparison: Right ankle series from the same day.   Findings: Severe soft tissue swelling about the foot, especially the dorsum of the foot at the metatarsal level.  No definite subcutaneous gas. No radiopaque foreign body identified. Underlying bone mineralization within normal limits.  No osteolysis, fracture or dislocation identified.  IMPRESSION: Severe soft tissue swelling.  No underlying acute osseous abnormality identified in the right foot.   Original Report Authenticated By: Erskine Speed, M.D.     Scheduled Meds:   . enoxaparin (LOVENOX) injection  100 mg Subcutaneous Q24H  . hydrocerin   Topical BID  . Tamsulosin HCl  0.4 mg Oral Daily  . vancomycin  750 mg Intravenous Q12H   Continuous Infusions:   . sodium chloride 75 mL/hr at 09/27/12 0217

## 2012-09-27 NOTE — Care Management Note (Addendum)
Page 1 of 2   10/07/2012     1:55:47 PM   CARE MANAGEMENT NOTE 10/07/2012  Patient:  Justin Justin [name], Justin Justin [name]   Account Number:  [id number]  Date Initiated:  09/27/2012  Documentation initiated by:  Lorenda Ishihara  Subjective/Objective Assessment:   79 yo male admitted with LE cellulitis, failed OP treatment. PTA lived at home with spouse.     Action/Plan:   Home when stable   Anticipated DC Date:  09/29/2012   Anticipated DC Plan:  HOME/SELF CARE      DC Planning Services  CM consult  MATCH Program  Medication Assistance      Choice offered to / List presented to:          [facility] arranged  HH - 11 Patient Refused      Status of service:  Completed, signed off Medicare Important Message given?  NA - LOS <3 / Initial given by admissions (If response is "NO", the following Medicare IM given date fields will be blank) Date Medicare IM given:  09/25/2012 Date Additional Medicare IM given:  10/05/2012  Discharge Disposition:  HOME/SELF CARE  Per UR Regulation:  Reviewed for med. necessity/level of care/duration of stay  If discussed at Long Length of Stay Meetings, dates discussed:    Comments:  10-07-12 Lorenda Ishihara RN CM 1000 Patient had d/c order, he called [facility] to dispute. Recieved Justin [name] call this am stating [facility] decision is that further treatment can be safely rendered outside of the inpatient setting of the hospital. However, the attending had resended the d/c order as she felt his condition had worsened and he would need PICC and IV abx long term. Attending and myself spoke with patient at bedside, attending explained to patient plan of care and recommendations for IV abx. Patient stated he did not want to persue IV abx, he wants to d/c home today on oral and f/u with MD as OP. Plan for d/c home today, continues to decline [facility] assistance.  10-05-12 Lorenda Ishihara RN CM 1400 Long discussion with patient about d/c and f/u care. Offered to provide patient assistance with medications, patient  is agreeable to this, unsure about which medicines he is willing to take. Match letter given to the patient.  09-30-12 Lorenda Ishihara RN CM 1100 Spoke with patient at bedside. Frustrated with care regimen. Encouraged to discuss with physician. Patient has decided that Xarelto is choice for treatment of DVT currently. States he could afford but it would be Justin [name] burden, not sure if he wants to continue this medication post d/c. Has recieved written options from pharmacy as to cost and treatment options. Discussed limits of hospital treatment and likely need for continued OP treatment. Patient does not want OP treatment only IP treatment to cure. Will continue to follow.  09-28-12 Lorenda Ishihara RN CM 1400 Spoke with patient at bedside. Patient states he opted not to have Medicare part B, has no drug benefits, pays for meds out of pocket. States he was in the [employer] but has opted not to have VA benefits. Patient has Justin [name] DVT for which he will need long term anticoagulation, has been ordered for an RN as he may elect Lovenox. Patient declines HH at this time. He is unsure about which anticoagulation medication he will elect to use, does not feel he can afford multiple labs to monitor INR but unsure about affordability of Lovenox. Xarelto is an option per attending but long term affordability is again the issue. Placed Justin [name] call to  pharmacy requesting assistance in best plan for this gentleman. Will f/u again later.

## 2012-09-27 NOTE — Evaluation (Signed)
Physical Therapy Evaluation Patient Details Name: Justin [name] MRN: [id number] DOB: [birth year] Today's Date: 09/27/2012 Time: 1050-1103 PT Time Calculation (min): 13 min  PT Assessment / Plan / Recommendation Clinical Impression  79 yo male admitted with R LE cellulits. On eval pt was Supervision assist for ambulation. Pt declined use of assistive device to aid in WBing on R LE. Noted mild-moderate antalgic gait pattern but pt able to mobilize safely and without assistance. Will plan to follow-up x 1 to ensure pt does not have any further needs. Do not recommend any follow-up PT at discharge.     PT Assessment  Patient needs continued PT services    Follow Up Recommendations  No PT follow up    Does the patient have the potential to tolerate intense rehabilitation      Barriers to Discharge        Equipment Recommendations  None recommended by PT    Recommendations for Other Services     Frequency Min 3X/week    Precautions / Restrictions Precautions Precautions: None Restrictions Weight Bearing Restrictions: No   Pertinent Vitals/Pain Painful R LE due to cracked skin surfaces-unrated      Mobility  Bed Mobility Bed Mobility: Supine to Sit Supine to Sit: 6: Modified independent (Device/Increase time) Transfers Transfers: Sit to Stand;Stand to Sit Sit to Stand: 6: Modified independent (Device/Increase time);From bed;From toilet Stand to Sit: 6: Modified independent (Device/Increase time);To toilet;To bed Ambulation/Gait Ambulation/Gait Assistance: 5: Supervision Ambulation Distance (Feet): 30 Feet Assistive device: None Ambulation/Gait Assistance Details: Pt declined use of assistive device-"that won't help." Able to demonstrate safe gait although with noted antalgia (for limited distance). No physical assistance needed.  Gait Pattern: Antalgic;Decreased stance time - right;Decreased dorsiflexion - right    Shoulder Instructions     Exercises     PT  Diagnosis: Difficulty walking;Abnormality of gait;Acute pain  PT Problem List: Decreased mobility;Pain PT Treatment Interventions: Gait training;Stair training;Functional mobility training   PT Goals Acute Rehab PT Goals PT Goal Formulation: With patient Time For Goal Achievement: 10/11/12 Potential to Achieve Goals: Good Pt will Ambulate: 16 - 50 feet;with modified independence PT Goal: Ambulate - Progress: Goal set today Pt will Go Up / Down Stairs: 3-5 stairs;with modified independence;with rail(s) PT Goal: Up/Down Stairs - Progress: Goal set today  Visit Information  Last PT Received On: 09/27/12 Assistance Needed: +1    Subjective Data  Subjective: "I would be fine if it wasn't for the cracked skin on my leg/foot" Patient Stated Goal: home   Prior Functioning       Cognition  Overall Cognitive Status: Appears within functional limits for tasks assessed/performed Arousal/Alertness: Awake/alert Orientation Level: Appears intact for tasks assessed Behavior During Session: [facility] for tasks performed    Extremity/Trunk Assessment Right Lower Extremity Assessment RLE ROM/Strength/Tone: Deficits RLE ROM/Strength/Tone Deficits: Very dry, cracked skin. Yellow drainage noted on dressing. strength at least 4/5 with functional mobility Left Lower Extremity Assessment LLE ROM/Strength/Tone: [facility] for tasks assessed   Balance    End of Session PT - End of Session Activity Tolerance: Patient limited by pain Patient left: in bed;with call bell/phone within reach  GP     [facility] 09/27/2012, 2:53 PM [phone]

## 2012-09-27 NOTE — Consult Note (Addendum)
WOC consult Note Reason for Consult:Right LE erythema and chronic skin condition.  Gray slipper sock is adhered to foot.  I have asked RN to soak and gently remove today prior to performing wound care as ordered. Wound type:inflammation vs infection vs venous insufficiency Pressure Ulcer POA: No Measurement:Circumferential involvement of RLE, chronic.  Patient states that he has had this condition for greater than 2 years. Wound EXB:MWUXLKG thickness skin loss in areas where dry skin has peeled away.  Right knee with thick area of dry skin with cracks (fissures).  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  States he uses neosporin topical cream to dress legs and some other oatmeal based cream (hydrocolloidal). Drainage (amount, consistency, odor) serous drainage on old dressings and on pillow being used for elevation Periwound:entire LE is inflamed albeit patient states it is improved over recent days, Dressing procedure/placement/frequency:I will suggest that we use Eucerin Cream twice daily (applied to non-adherent pads), followed by the application of ABD pads and Kerlix wraps. I do think that this is more than a venous insufficiency and that Unna's Boots would not be advisable due to the presence of erythema and warmth.  Referral, should patient be willing to consider might be to the outpatient wound care center or to perhaps the LE Clinic at [facility], which is housed in the Dermatology Department.It is slightly beyond the scope of the WOC nurse, so regretfully, I have not much else to offer. I will not follow.  Please re-consult if needed. Thanks, Ladona Mow, MSN, RN, [facility], CWOCN [id number])

## 2012-09-28 DIAGNOSIS — I82409 Acute embolism and thrombosis of unspecified deep veins of unspecified lower extremity: Secondary | ICD-10-CM | POA: Diagnosis present

## 2012-09-28 LAB — PROTIME-INR
INR: 1.09 (ref 0.00–1.49)
Prothrombin Time: 14 seconds (ref 11.6–15.2)

## 2012-09-28 LAB — CBC
MCHC: 33.5 g/dL (ref 30.0–36.0)
Platelets: 177 10*3/uL (ref 150–400)
RDW: 14.2 % (ref 11.5–15.5)
WBC: 7.3 10*3/uL (ref 4.0–10.5)

## 2012-09-28 MED ORDER — WARFARIN - PHARMACIST DOSING INPATIENT: Freq: Every day | Status: DC

## 2012-09-28 MED ORDER — WARFARIN SODIUM 6 MG PO TABS
6.0000 mg | ORAL_TABLET | Freq: Once | ORAL | Status: DC
  Filled 2012-09-28: qty 1

## 2012-09-28 NOTE — Progress Notes (Signed)
[facility] PROGRESS NOTE  Justin [name] KGM:[id number] DOB: [birth year] DOA: 09/25/2012 PCP: No primary provider on file.  Brief narrative: 79 year old male with no significant past medical history who presented with right lower extremity swelling (chronic) on multiple antibiotics PO, difficulty ambulation. In addition patient reported hematuria. No fevers, no nausea or vomiting.  In ED, no significant findings on lab work up but on physical exam right lower extremity very red and painful on palpation.Patient started empirically on vancomycin. Large right venous Doppler showed DVT in left lower extremity. Wound care consult requested for management of right lower extremity chronic skin changes superimposed on cellulitis.  Assessment/Plan: .   Principal Problem:  *Hematuria  No significant findings are renal ultrasound Hemoglobin dropped initially from 14.5-11.1 but now remains stable at 11.4 No further hematuria observed Active Problems:  Cellulitis / Venous stasis dermatitis  Continue vancomycin  Appreciate wound care consult - recommendation for Eucerin Cream twice daily (applied to non-adherent pads), followed by the application of ABD pads and Kerlix wraps Left lower extremity DVT  Patient was initially on heparin subcutaneous for DVT prophylaxis but he noticed hematuria for which reason we switched to SCDs bilaterally. After the results of lower extremity Doppler revealed left lower extremity DVT we started Lovenox for treatment. We have observed the patient for 2 days for any signs of bleed. Urinalysis remains clear with no traces of blood in it. It isn't safe to start oral: Anticoagulation today 09/28/2012. Coumadin per pharmacy.   Code Status: full code  Family Communication: no family at bedside  Disposition Plan: home when stable  Manson Passey, MD  [facility]  Pager [phone]   Consultants:  Wound care  Procedures and studies Lower extremity Doppler Antibiotics:  vanco  09/25/2012 -->  If 7PM-7AM, please contact night-coverage [email or web address] Password [facility] 09/28/2012, 12:42 PM   LOS: 3 days   HPI/Subjective: No acute overnight events.  Objective: Filed Vitals:   09/27/12 0602 09/27/12 1400 09/27/12 2155 09/28/12 0610  BP: 121/74 119/87 132/69 117/74  Pulse: 70 83 79 70  Temp: 97.6 F (36.4 C) 98.1 F (36.7 C) 97.5 F (36.4 C) 97.4 F (36.3 C)  TempSrc: Oral Oral Oral Oral  Resp: 18 18 18 18   Height:      Weight:      SpO2: 96% 98% 97% 96%    Intake/Output Summary (Last 24 hours) at 09/28/12 1242 Last data filed at 09/28/12 1000  Gross per 24 hour  Intake 2861.25 ml  Output    300 ml  Net 2561.25 ml    Exam:   General:  Pt is alert, follows commands appropriately, not in acute distress  Cardiovascular: Regular rate and rhythm, S1/S2, no murmurs, no rubs, no gallops  Respiratory: Clear to auscultation bilaterally, no wheezing, no crackles, no rhonchi  Abdomen: Soft, non tender, non distended, bowel sounds present, no guarding  Extremities: Right lower extremity swelling, erythema, cellulitis, pulses DP and PT palpable bilaterally  Neuro: Grossly nonfocal  Data Reviewed: Basic Metabolic Panel:  Lab 09/26/12 4259 09/25/12 1950  NA 138 140  K 4.0 4.2  CL 104 102  CO2 27 28  GLUCOSE 86 116*  BUN 26* 29*  CREATININE 0.83 0.88  CALCIUM 8.0* 9.1  MG -- --  PHOS -- --   Liver Function Tests:  Lab 09/26/12 0435 09/25/12 1950  AST 24 31  ALT 13 18  ALKPHOS 52 72  BILITOT 0.4 0.3  PROT 4.8* 6.4  ALBUMIN 2.6* 3.5   No results  found for this basename: LIPASE:5,AMYLASE:5 in the last 168 hours No results found for this basename: AMMONIA:5 in the last 168 hours CBC:  Lab 09/28/12 0435 09/27/12 0404 09/26/12 0435 09/25/12 1124  WBC 7.3 7.4 8.9 9.4  NEUTROABS -- -- 4.3 --  HGB 11.4* 11.0* 11.1* 14.5  HCT 34.0* 32.6* 32.8* 46.3  MCV 97.7 97.0 97.6 101.9*  PLT 177 167 165 --   Cardiac Enzymes:  Lab 09/25/12 1950    CKTOTAL 176  CKMB --  CKMBINDEX --  TROPONINI --   BNP: No components found with this basename: POCBNP:5 CBG: No results found for this basename: GLUCAP:5 in the last 168 hours  Recent Results (from the past 240 hour(s))  CULTURE, BLOOD (ROUTINE X 2)     Status: Normal (Preliminary result)   Collection Time   09/25/12  7:45 PM      Component Value Range Status Comment   Specimen Description BLOOD LEFT FOREARM  3 ML IN [facility] BOTTLE   Final    Special Requests NONE   Final    Culture  Setup Time 09/25/2012 22:59   Final    Culture     Final    Value:        BLOOD CULTURE RECEIVED NO GROWTH TO DATE CULTURE WILL BE HELD FOR 5 DAYS BEFORE ISSUING A FINAL NEGATIVE REPORT   Report Status PENDING   Incomplete   CULTURE, BLOOD (ROUTINE X 2)     Status: Normal (Preliminary result)   Collection Time   09/25/12  7:50 PM      Component Value Range Status Comment   Specimen Description BLOOD RIGHT FOREARM  1 ML IN AEROBIC ONLY   Final    Special Requests NONE   Final    Culture  Setup Time 09/25/2012 22:59   Final    Culture     Final    Value:        BLOOD CULTURE RECEIVED NO GROWTH TO DATE CULTURE WILL BE HELD FOR 5 DAYS BEFORE ISSUING A FINAL NEGATIVE REPORT   Report Status PENDING   Incomplete      Studies: US Renal  09/26/2012  *RADIOLOGY REPORT*  Clinical Data: Hematuria.  RENAL/URINARY TRACT ULTRASOUND COMPLETE  Comparison:  None.  Findings:  Right Kidney:  Normal.  10.0 cm in length.  Left Kidney:  Normal.  10.1 cm in length.  Bladder:  Normal.  Bilateral ureteral jets identified.  The prostate gland is enlarged measuring 7.3 x 7.2 x 6.2 cm.  12 mm calcification in the prostate.  IMPRESSION:  1.  Normal kidneys. 2.  No renal calculi or hydronephrosis. 3.  Enlarged prostate gland.   Original Report Authenticated By: Francene Boyers, M.D.     Scheduled Meds:   . enoxaparin (LOVENOX) injection  100 mg Subcutaneous Q24H  . hydrocerin   Topical BID  . Tamsulosin HCl  0.4 mg Oral Daily  .  vancomycin  750 mg Intravenous Q12H   Continuous Infusions:   . sodium chloride 75 mL/hr at 09/27/12 1750

## 2012-09-28 NOTE — Progress Notes (Signed)
ANTICOAGULATION CONSULT NOTE - follow up  Pharmacy Consult for Lovenox and Coumadin Indication: DVT  No Known Allergies  Patient Measurements: Height: 5\' 8"  (172.7 cm) Weight: 162 lb 11.2 oz (73.8 kg) IBW/kg (Calculated) : 68.4   Vital Signs: Temp: 97.4 F (36.3 C) (01/14 0610) Temp src: Oral (01/14 0610) BP: 117/74 mmHg (01/14 0610) Pulse Rate: 70  (01/14 0610)  Labs:  Basename 09/28/12 1315 09/28/12 0435 09/27/12 0404 09/26/12 0435 09/25/12 1950  HGB -- 11.4* 11.0* -- --  HCT -- 34.0* 32.6* 32.8* --  PLT -- 177 167 165 --  APTT -- -- -- -- --  LABPROT 14.0 -- -- -- --  INR 1.09 -- -- -- --  HEPARINUNFRC -- -- -- -- --  CREATININE -- -- -- 0.83 0.88  CKTOTAL -- -- -- -- 176  CKMB -- -- -- -- --  TROPONINI -- -- -- -- --   Estimated Creatinine Clearance: 66.4 ml/min (by C-G formula based on Cr of 0.83).  Medical History: Past Medical History  Diagnosis Date  . Scarlet fever   . Erysipelas    Medications:  Scheduled:     . enoxaparin (LOVENOX) injection  100 mg Subcutaneous Q24H  . hydrocerin   Topical BID  . Tamsulosin HCl  0.4 mg Oral Daily  . vancomycin  750 mg Intravenous Q12H   Assessment: 63 yoM admitted with probable cellulitis, hx of multiple antibiotics and circulation deficit to LE's.  LE doppler positive for L DVT.  Lovenox started 1/12 at 1.5mg /kg q24h.  Hematuria this admit. None x 2 days per MD.   Hgb/Hct low but stable. Pltc improved to 177 today.  Coumadin to start today. Baseline INR 1.09. CHEST guidelines call for 5 day overlap of Lovenox/Coumadin and INR therapeutic x 24hrs.  Goals of Therapy: INR 2-3 Anti-Xa level 0.6-1.2 units/ml 4hrs after LMWH dose given Monitor platelets by anticoagulation protocol: Yes   Plan:   Continue Lovenox 100mg  SQ q24h.  Coumadin 6mg  today at 1800.  Check INR and CBC daily.  Coumadin education.  Charolotte Eke, PharmD, pager [phone]. 09/28/2012,2:19 PM.

## 2012-09-28 NOTE — Progress Notes (Signed)
ANTIBIOTIC CONSULT NOTE - follow up  Pharmacy Consult for Vancomycin Indication: Cellulitis  No Known Allergies  Patient Measurements: Height: 5\' 8"  (172.7 cm) Weight: 162 lb 11.2 oz (73.8 kg) IBW/kg (Calculated) : 68.4   Vital Signs: Temp: 97.4 F (36.3 C) (01/14 0610) Temp src: Oral (01/14 0610) BP: 117/74 mmHg (01/14 0610) Pulse Rate: 70  (01/14 0610)  Labs:  Basename 09/28/12 0435 09/27/12 0404 09/26/12 0435 09/25/12 1950  WBC 7.3 7.4 8.9 --  HGB 11.4* 11.0* 11.1* --  PLT 177 167 165 --  LABCREA -- -- -- --  CREATININE -- -- 0.83 0.88   Estimated Creatinine Clearance: 66.4 ml/min (by C-G formula based on Cr of 0.83).  Microbiology: Recent Results (from the past 720 hour(s))  CULTURE, BLOOD (ROUTINE X 2)     Status: Normal (Preliminary result)   Collection Time   09/25/12  7:45 PM      Component Value Range Status Comment   Specimen Description BLOOD LEFT FOREARM  3 ML IN [facility] BOTTLE   Final    Special Requests NONE   Final    Culture  Setup Time 09/25/2012 22:59   Final    Culture     Final    Value:        BLOOD CULTURE RECEIVED NO GROWTH TO DATE CULTURE WILL BE HELD FOR 5 DAYS BEFORE ISSUING A FINAL NEGATIVE REPORT   Report Status PENDING   Incomplete   CULTURE, BLOOD (ROUTINE X 2)     Status: Normal (Preliminary result)   Collection Time   09/25/12  7:50 PM      Component Value Range Status Comment   Specimen Description BLOOD RIGHT FOREARM  1 ML IN AEROBIC ONLY   Final    Special Requests NONE   Final    Culture  Setup Time 09/25/2012 22:59   Final    Culture     Final    Value:        BLOOD CULTURE RECEIVED NO GROWTH TO DATE CULTURE WILL BE HELD FOR 5 DAYS BEFORE ISSUING A FINAL NEGATIVE REPORT   Report Status PENDING   Incomplete    Medical History: Past Medical History  Diagnosis Date  . Scarlet fever   . Erysipelas    Assessment:  27 YOM admitted 1/11 with concern for cellulitis.  Hx of leg circulation problems and recurrent cellulitis and  multiple courses of antibiotics.  Day #4 Vancomycin.  Afebrile, WBCs wnl, SCr stable as of 1/12.  Blood cx negative to date.  Goal of Therapy:  Vancomycin trough level 10-15 mcg/ml  Plan:   Continue Vanc 750mg  IV q12h for now.   Possible non-infectious etiology noted so will f/u MD assessment today to see if abx are to continue then consider trough.  Follow up renal fxn and culture results.  Charolotte Eke, PharmD, pager [phone]. 09/28/2012,8:33 AM.

## 2012-09-29 LAB — CBC
Hemoglobin: 12.3 g/dL — ABNORMAL LOW (ref 13.0–17.0)
MCH: 33 pg (ref 26.0–34.0)
MCV: 98.1 fL (ref 78.0–100.0)
RBC: 3.73 MIL/uL — ABNORMAL LOW (ref 4.22–5.81)

## 2012-09-29 LAB — BASIC METABOLIC PANEL
CO2: 29 mEq/L (ref 19–32)
Calcium: 8.4 mg/dL (ref 8.4–10.5)
Creatinine, Ser: 0.75 mg/dL (ref 0.50–1.35)
Glucose, Bld: 93 mg/dL (ref 70–99)

## 2012-09-29 LAB — PROTIME-INR: Prothrombin Time: 13.9 seconds (ref 11.6–15.2)

## 2012-09-29 MED ORDER — RIVAROXABAN 15 MG PO TABS
15.0000 mg | ORAL_TABLET | Freq: Two times a day (BID) | ORAL | Status: DC
  Administered 2012-09-29 – 2012-10-07 (×16): 15 mg via ORAL
  Filled 2012-09-29 (×18): qty 1

## 2012-09-29 MED ORDER — RIVAROXABAN 20 MG PO TABS
20.0000 mg | ORAL_TABLET | Freq: Every day | ORAL | Status: DC
  Filled 2012-09-29: qty 1

## 2012-09-29 MED ORDER — VANCOMYCIN HCL 10 G IV SOLR
1250.0000 mg | INTRAVENOUS | Status: DC
  Administered 2012-09-29 – 2012-09-30 (×2): 1250 mg via INTRAVENOUS
  Filled 2012-09-29 (×3): qty 1250

## 2012-09-29 NOTE — Progress Notes (Signed)
[facility] PROGRESS NOTE  Justin [name] ZOX:[id number] DOB: 1927/10/13 DOA: 09/25/2012  PCP: Dr. Brynda Rim??  Brief HPI: 79 year old male with no major medical problems presents with concerns of right leg pain and cellulitis. The patient has had a very protracted history. Apparently, the patient has had multiple courses antibiotics for "cellulitis" of the right lower extremity which have included doxycycline, clindamycin, Augmentin, cephalexin, and Levaquin. The patient has had right lower extremity erythema and swelling since the beginning of 2013. Please note that during these previous episodes, the patient has never had any fevers, chills, or actual pain in his right lower extremity. In early November 2013, the patient was given a course of Augmentin for cellulitis of his right lower extremity again. Even prior to this course of antibiotic, the patient has had edema, erythema of his right lower extremity as well as some patchy areas of erythema on his right upper extremity. After taking the Augmentin 13 days, the patient noted increasing scaling, erythema, and scaly patches on his right lower extremity and right upper extremity. The patient was given clindamycin and then prednisone. This did not seem to help much. His erythematous patches on his right arm and edema and erythema on his right leg continued to progress despite prednisone. The patient was given levofloxacin on 08/18/2012. He stated that after one dose of the Levaquin, his scaly erythematous patches on his right upper extremity spread to his torso, flank, left upper extremity. In addition, the erythema and scaling in his right lower extremity also worsened. Since that period of time, the patient has been self-medicating himself with topical Neosporin as well as Preparation H with Biotin which he obtains from [place]. He states that this has not helped much. He is been using these topicals mainly for the complaint of pruritus. On most  days, the patient experiences pruritus on his arms, torso, and bilateral thighs. Please note the patient is not having significant pain except that in his ankle. He denies any recent injury or trauma. Since the Augmentin and Levaquin, the patient has noted increasing clear drainage from his right lower extremity without odor or pus. On 07/23/2012, the patient also had a nonsurgical/superficial culture performed on his right lower extremity drainage. It grew group A streptococcus and pseudomonas aeruginosa. Please note that during all his recurrent episodes of his "cellulitis" but the patient has never had any systemic toxicity, fever, chills, rigors, nausea, vomiting, diarrhea. In fact, the patient states that outside of the frustration from his right lower extremity and other rashes, he feels "pretty good". Patient denies any exotic travels. He only has one dog without any other exotic pets.  Past medical history:  Past Medical History  Diagnosis Date  . Scarlet fever   . Erysipelas     Consultants: None  Procedures: None  Antibiotics: IV Vanc  Subjective: Patient denies any pain. Dressing was applied to his right leg today. Able to ambulate.  Objective: Vital Signs  Filed Vitals:   09/28/12 1400 09/28/12 2114 09/29/12 0602 09/29/12 1400  BP: 116/60 96/60 115/72 122/73  Pulse: 71 82 71 82  Temp: 98.4 F (36.9 C) 98.3 F (36.8 C) 97.7 F (36.5 C) 98.2 F (36.8 C)  TempSrc: Oral Oral Axillary Oral  Resp: 20 18 16 18   Height:      Weight:      SpO2: 97% 97% 95% 98%    Intake/Output Summary (Last 24 hours) at 09/29/12 1443 Last data filed at 09/29/12 1400  Gross per 24 hour  Intake 2376.25 ml  Output    300 ml  Net 2076.25 ml   Filed Weights   09/25/12 1900  Weight: 73.8 kg (162 lb 11.2 oz)    Intake/Output from previous day: 01/14 0701 - 01/15 0700 In: 1842.5 [P.O.:360; I.V.:1482.5] Out: -   General appearance: alert, cooperative, appears stated age and no  distress Head: Normocephalic, without obvious abnormality, atraumatic Resp: clear to auscultation bilaterally Cardio: regular rate and rhythm, S1, S2 normal, no murmur, click, rub or gallop GI: soft, non-tender; bowel sounds normal; no masses,  no organomegaly Extremities: right leg is covered with dressing Pulses: poorly palpable. Skin: scaly erythematous rash in both legs. Neurologic: Alert and oriented x 3. No focal deficits.  Lab Results:  Basic Metabolic Panel:  Lab 09/29/12 1610 09/26/12 0435 09/25/12 1950  NA 138 138 140  K 3.7 4.0 4.2  CL 104 104 102  CO2 29 27 28   GLUCOSE 93 86 116*  BUN 15 26* 29*  CREATININE 0.75 0.83 0.88  CALCIUM 8.4 8.0* 9.1  MG -- -- --  PHOS -- -- --   Liver Function Tests:  Lab 09/26/12 0435 09/25/12 1950  AST 24 31  ALT 13 18  ALKPHOS 52 72  BILITOT 0.4 0.3  PROT 4.8* 6.4  ALBUMIN 2.6* 3.5   No results found for this basename: LIPASE:5,AMYLASE:5 in the last 168 hours No results found for this basename: AMMONIA:5 in the last 168 hours CBC:  Lab 09/29/12 0832 09/28/12 0435 09/27/12 0404 09/26/12 0435 09/25/12 1124  WBC 6.5 7.3 7.4 8.9 9.4  NEUTROABS -- -- -- 4.3 --  HGB 12.3* 11.4* 11.0* 11.1* 14.5  HCT 36.6* 34.0* 32.6* 32.8* 46.3  MCV 98.1 97.7 97.0 97.6 101.9*  PLT 193 177 167 165 --   Cardiac Enzymes:  Lab 09/25/12 1950  CKTOTAL 176  CKMB --  CKMBINDEX --  TROPONINI --    Recent Results (from the past 240 hour(s))  CULTURE, BLOOD (ROUTINE X 2)     Status: Normal (Preliminary result)   Collection Time   09/25/12  7:45 PM      Component Value Range Status Comment   Specimen Description BLOOD LEFT FOREARM  3 ML IN [facility] BOTTLE   Final    Special Requests NONE   Final    Culture  Setup Time 09/25/2012 22:59   Final    Culture     Final    Value:        BLOOD CULTURE RECEIVED NO GROWTH TO DATE CULTURE WILL BE HELD FOR 5 DAYS BEFORE ISSUING A FINAL NEGATIVE REPORT   Report Status PENDING   Incomplete   CULTURE, BLOOD  (ROUTINE X 2)     Status: Normal (Preliminary result)   Collection Time   09/25/12  7:50 PM      Component Value Range Status Comment   Specimen Description BLOOD RIGHT FOREARM  1 ML IN AEROBIC ONLY   Final    Special Requests NONE   Final    Culture  Setup Time 09/25/2012 22:59   Final    Culture     Final    Value:        BLOOD CULTURE RECEIVED NO GROWTH TO DATE CULTURE WILL BE HELD FOR 5 DAYS BEFORE ISSUING A FINAL NEGATIVE REPORT   Report Status PENDING   Incomplete       Studies/Results: No results found.  Medications:  Scheduled:   . enoxaparin (LOVENOX) injection  100 mg Subcutaneous Q24H  .  hydrocerin   Topical BID  . Tamsulosin HCl  0.4 mg Oral Daily  . vancomycin  1,250 mg Intravenous Q24H  . warfarin  6 mg Oral ONCE-1800  . Warfarin - Pharmacist Dosing Inpatient   Does not apply q1800   Continuous:   . sodium chloride 75 mL/hr at 09/29/12 0301   WUJ:[id number], acetaminophen, diphenhydrAMINE, oxyCODONE  Assessment/Plan:  Principal Problem:  *Hematuria Active Problems:  Cellulitis  Venous stasis dermatitis  DVT of lower extremity (deep venous thrombosis)    Hematuria  No further episodes. No abnormality on renal US.  Continue to monitor.  Cellulitis / Venous stasis dermatitis  I am not certain this is infectious. Could be a dermatological condition primarily perhaps with secondary bacterial infection. Might benefit from OP referral to Dermatology. Unfortunately patient has preconceived notions about his medical problems. He is not amenable to following medical recommendations. Continue Vanc for now. I discussed with him regarding changing to oral antibiotics. His expectation is a complete 'cure' by the time he leaves the hospital. I have explained to him that this expectation may be unreasonable. Will continue to discuss with him. Appreciate wound care consult - recommendation for Eucerin Cream twice daily (applied to non-adherent pads), followed by the  application of ABD pads and Kerlix wraps  Left lower extremity DVT  Patient was observed for worsening of hematuria on Lovenox. This has not happened. Patient doesn't want to try Warfarin. Agreeable to Xarelto. Will initiate.   Code Status: full code  Family Communication: no family at bedside  Disposition Plan: home when stable   DVT Prophylaxis Lovenox   LOS: 4 days   [facility]  [facility] Pager [phone] 09/29/2012, 2:43 PM  If 8PM-8AM, please contact night-coverage at [email or web address], password [email or web address]

## 2012-09-29 NOTE — Progress Notes (Signed)
Pharmacy - brief note  Justin [name] refuses to start Coumadin therapy. Today I gave him printed information from [employer] on the treatment of DVT as well as a list of the available pharmacologic agents and an estimation of their daily costs. I spoke with the patient more about weighing the risks/costs of treating his DVT vs. the risks/complications of not treating.  Xarelto may be the least costly option and would be relatively free of the potential herbal interactions associated with Coumadin.  We will cont to f/u daily.  Charolotte Eke, PharmD, pager [phone]. 09/29/2012,11:27 AM.

## 2012-09-29 NOTE — Progress Notes (Signed)
Physical Therapy Treatment and D/C from acute PT Patient Details Name: Justin [name] MRN: [id number] DOB: 15-Feb-1928 Today's Date: 09/29/2012 Time: 1535-1600 PT Time Calculation (min): 25 min  PT Assessment / Plan / Recommendation Comments on Treatment Session  Pt ambulated in hallway and performed stairs.  Pt reports minimal pain with ambulation.  Pt met goals and agreeable with d/c from PT.  Pt agreed to ambulate with nsg staff while in hospital.    Follow Up Recommendations  No PT follow up     Does the patient have the potential to tolerate intense rehabilitation     Barriers to Discharge        Equipment Recommendations  None recommended by PT    Recommendations for Other Services    Frequency     Plan All goals met and education completed, patient dischaged from PT services    Precautions / Restrictions Precautions Precautions: None   Pertinent Vitals/Pain Pt reports minimal pain in R LE    Mobility  Bed Mobility Bed Mobility: Supine to Sit Supine to Sit: 7: Independent Transfers Transfers: Sit to Stand;Stand to Sit Sit to Stand: 7: Independent Stand to Sit: 7: Independent Ambulation/Gait Ambulation/Gait Assistance: 6: Modified independent (Device/Increase time) Ambulation Distance (Feet): 400 Feet Assistive device: None Ambulation/Gait Assistance Details: pt pushed IV pole but also able to ambulate without IV pole, slight antalgic gait  Gait Pattern: Step-through pattern;Decreased stance time - right;Decreased dorsiflexion - right Stairs: Yes Stairs Assistance: 6: Modified independent (Device/Increase time) Stairs Assistance Details (indicate cue type and reason): pt held onto hand rail, verbal cue to for safety with IV line Stair Management Technique: Alternating pattern;One rail Left Number of Stairs: 5     Exercises     PT Diagnosis:    PT Problem List:   PT Treatment Interventions:     PT Goals Acute Rehab PT Goals PT Goal: Ambulate - Progress:  Met PT Goal: Up/Down Stairs - Progress: Met  Visit Information  Last PT Received On: 09/29/12 Assistance Needed: +1    Subjective Data  Subjective: What can we put over my foot?  (pt declined large grey socks but donned blue contact footie)   Cognition  Overall Cognitive Status: Appears within functional limits for tasks assessed/performed    Balance     End of Session PT - End of Session Activity Tolerance: Patient tolerated treatment well Patient left: in bed;with call bell/phone within reach   GP     [facility] E 09/29/2012, 4:32 PM Pager: [phone]

## 2012-09-29 NOTE — Progress Notes (Signed)
ANTIBIOTIC CONSULT NOTE - follow up  Pharmacy Consult for Vancomycin Indication: Cellulitis  No Known Allergies  Patient Measurements: Height: 5\' 8"  (172.7 cm) Weight: 162 lb 11.2 oz (73.8 kg) IBW/kg (Calculated) : 68.4   Vital Signs: Temp: 97.7 F (36.5 C) (01/15 0602) Temp src: Axillary (01/15 0602) BP: 115/72 mmHg (01/15 0602) Pulse Rate: 71  (01/15 0602)  Labs:  Basename 09/29/12 0832 09/28/12 0435 09/27/12 0404  WBC 6.5 7.3 7.4  HGB 12.3* 11.4* 11.0*  PLT 193 177 167  LABCREA -- -- --  CREATININE 0.75 -- --   Estimated Creatinine Clearance: 68.9 ml/min (by C-G formula based on Cr of 0.75).  Microbiology: Recent Results (from the past 720 hour(s))  CULTURE, BLOOD (ROUTINE X 2)     Status: Normal (Preliminary result)   Collection Time   09/25/12  7:45 PM      Component Value Range Status Comment   Specimen Description BLOOD LEFT FOREARM  3 ML IN [facility] BOTTLE   Final    Special Requests NONE   Final    Culture  Setup Time 09/25/2012 22:59   Final    Culture     Final    Value:        BLOOD CULTURE RECEIVED NO GROWTH TO DATE CULTURE WILL BE HELD FOR 5 DAYS BEFORE ISSUING A FINAL NEGATIVE REPORT   Report Status PENDING   Incomplete   CULTURE, BLOOD (ROUTINE X 2)     Status: Normal (Preliminary result)   Collection Time   09/25/12  7:50 PM      Component Value Range Status Comment   Specimen Description BLOOD RIGHT FOREARM  1 ML IN AEROBIC ONLY   Final    Special Requests NONE   Final    Culture  Setup Time 09/25/2012 22:59   Final    Culture     Final    Value:        BLOOD CULTURE RECEIVED NO GROWTH TO DATE CULTURE WILL BE HELD FOR 5 DAYS BEFORE ISSUING A FINAL NEGATIVE REPORT   Report Status PENDING   Incomplete    Medical History: Past Medical History  Diagnosis Date  . Scarlet fever   . Erysipelas    Assessment:  Justin [name] admitted 1/11 with concern for cellulitis.  Hx of leg circulation problems and recurrent cellulitis and multiple courses of  antibiotics.  Day #5 Vancomycin.  Afebrile, WBCs wnl, SCr wnl.  Blood cx negative to date.  Vanc trough slightly above target range.  Goal of Therapy:  Vancomycin trough level 10-15 mcg/ml  Plan:   Adust Vanc to 1250mg  IV q24h.    Follow up renal fxn and culture results.  Consider repeat trough in 3-4 days if Vanc continues.  Charolotte Eke, PharmD, pager [phone]. 09/29/2012,10:20 AM.

## 2012-09-29 NOTE — Progress Notes (Signed)
ANTICOAGULATION CONSULT NOTE - Initial Consult  Pharmacy Consult for Xarelto Indication: LE DVT  No Known Allergies  Patient Measurements: Height: 5\' 8"  (172.7 cm) Weight: 162 lb 11.2 oz (73.8 kg) IBW/kg (Calculated) : 68.4   Vital Signs: Temp: 98.2 F (36.8 C) (01/15 1400) Temp src: Oral (01/15 1400) BP: 122/73 mmHg (01/15 1400) Pulse Rate: 82  (01/15 1400)  Labs:  Basename 09/29/12 0832 09/28/12 1315 09/28/12 0435 09/27/12 0404  HGB 12.3* -- 11.4* --  HCT 36.6* -- 34.0* 32.6*  PLT 193 -- 177 167  APTT -- -- -- --  LABPROT 13.9 14.0 -- --  INR 1.08 1.09 -- --  HEPARINUNFRC -- -- -- --  CREATININE 0.75 -- -- --  CKTOTAL -- -- -- --  CKMB -- -- -- --  TROPONINI -- -- -- --    Estimated Creatinine Clearance: 68.9 ml/min (by C-G formula based on Cr of 0.75).   Medical History: Past Medical History  Diagnosis Date  . Scarlet fever   . Erysipelas     Assessment:  31 yom with new LLE DVT, started on Lovenox 1/12 and bridged to Coumadin 1/14 but patient has been refusing Coumadin and worsening of hematuria on Lovenox per MD notes.  Patient is agreeable to Xarelto, MD order to stop Lovenox and Coumadin and start Xarelto.   Last dose of Lovenox @ 1734 on 09/28/12.  INR 1.08 (no coumadin doses so far).    H/H and platelets okay. Good renal function.     Plan:   D/C Lovenox, Coumadin, protocols and associated labs  Xarelto 15 mg twice daily with meal for 21 days then Xarelto 20 mg once daily with food.   Pharmacy will f/u   Geoffry Paradise, PharmD, BCPS Pager: [phone] 3:16 PM Pharmacy #: [id number]

## 2012-09-30 DIAGNOSIS — A46 Erysipelas: Principal | ICD-10-CM | POA: Diagnosis present

## 2012-09-30 DIAGNOSIS — L03119 Cellulitis of unspecified part of limb: Secondary | ICD-10-CM

## 2012-09-30 DIAGNOSIS — L02419 Cutaneous abscess of limb, unspecified: Secondary | ICD-10-CM

## 2012-09-30 LAB — CBC
HCT: 38.1 % — ABNORMAL LOW (ref 39.0–52.0)
MCV: 98.4 fL (ref 78.0–100.0)
RDW: 14.1 % (ref 11.5–15.5)
WBC: 7.3 10*3/uL (ref 4.0–10.5)

## 2012-09-30 MED ORDER — CEPHALEXIN 500 MG PO CAPS
500.0000 mg | ORAL_CAPSULE | Freq: Four times a day (QID) | ORAL | Status: DC
  Administered 2012-09-30 – 2012-10-07 (×28): 500 mg via ORAL
  Filled 2012-09-30 (×36): qty 1

## 2012-09-30 MED ORDER — DOXYCYCLINE HYCLATE 100 MG PO TABS
100.0000 mg | ORAL_TABLET | Freq: Two times a day (BID) | ORAL | Status: DC
  Administered 2012-09-30 – 2012-10-06 (×13): 100 mg via ORAL
  Filled 2012-09-30 (×15): qty 1

## 2012-09-30 NOTE — Consult Note (Addendum)
I have seen and examined the patient and agree with the assessment and plans. Recommend MRI of extremity  He was very argumentative with Dr. Magnus Ivan  about surgery involvement in his care.  Douglas A. Magnus Ivan  MD, FACS   Ardeth Sportsman, M.D., F.A.C.S. Gastrointestinal and Minimally Invasive Surgery [facility] [address], [address] [place], [place] [address] [phone] Main / Paging [phone] Voice Mail
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
## 2012-09-30 NOTE — Progress Notes (Signed)
[facility] PROGRESS NOTE  Justin [name] NWG:[id number] DOB: 1928/03/31 DOA: 09/25/2012  PCP: Dr. Brynda Rim??  Brief HPI: 79 year old male with no major medical problems presents with concerns of right leg pain and cellulitis. The patient has had a very protracted history. Apparently, the patient has had multiple courses antibiotics for "cellulitis" of the right lower extremity which have included doxycycline, clindamycin, Augmentin, cephalexin, and Levaquin. The patient has had right lower extremity erythema and swelling since the beginning of 2013. Please note that during these previous episodes, the patient has never had any fevers, chills, or actual pain in his right lower extremity. In early November 2013, the patient was given a course of Augmentin for cellulitis of his right lower extremity again. Even prior to this course of antibiotic, the patient has had edema, erythema of his right lower extremity as well as some patchy areas of erythema on his right upper extremity. After taking the Augmentin 13 days, the patient noted increasing scaling, erythema, and scaly patches on his right lower extremity and right upper extremity. The patient was given clindamycin and then prednisone. This did not seem to help much. His erythematous patches on his right arm and edema and erythema on his right leg continued to progress despite prednisone. The patient was given levofloxacin on 08/18/2012. He stated that after one dose of the Levaquin, his scaly erythematous patches on his right upper extremity spread to his torso, flank, left upper extremity. In addition, the erythema and scaling in his right lower extremity also worsened. Since that period of time, the patient has been self-medicating himself with topical Neosporin as well as Preparation H with Biotin which he obtains from [place]. He states that this has not helped much. He is been using these topicals mainly for the complaint of pruritus. On most  days, the patient experiences pruritus on his arms, torso, and bilateral thighs. Please note the patient is not having significant pain except that in his ankle. He denies any recent injury or trauma. Since the Augmentin and Levaquin, the patient has noted increasing clear drainage from his right lower extremity without odor or pus. On 07/23/2012, the patient also had a nonsurgical/superficial culture performed on his right lower extremity drainage. It grew group A streptococcus and pseudomonas aeruginosa. Please note that during all his recurrent episodes of his "cellulitis" but the patient has never had any systemic toxicity, fever, chills, rigors, nausea, vomiting, diarrhea. In fact, the patient states that outside of the frustration from his right lower extremity and other rashes, he feels "pretty good". Patient denies any exotic travels. He only has one dog without any other exotic pets.  Past medical history:  Past Medical History  Diagnosis Date  . Scarlet fever   . Erysipelas     Consultants: None  Procedures: None  Antibiotics: IV Vanc  Subjective: Patient feels the scaling of skin has worsened in the right leg. Is frustrated by the fact that the infection is not improving.  Objective: Vital Signs  Filed Vitals:   09/29/12 0602 09/29/12 1400 09/29/12 2100 09/30/12 0648  BP: 115/72 122/73 119/77 121/67  Pulse: 71 82 79 68  Temp: 97.7 F (36.5 C) 98.2 F (36.8 C) 98 F (36.7 C) 97.9 F (36.6 C)  TempSrc: Axillary Oral Oral Oral  Resp: 16 18 20 16   Height:      Weight:      SpO2: 95% 98% 97% 97%    Intake/Output Summary (Last 24 hours) at 09/30/12 1306 Last data filed  at 09/30/12 1000  Gross per 24 hour  Intake 2506.25 ml  Output      0 ml  Net 2506.25 ml   Filed Weights   09/25/12 1900  Weight: 73.8 kg (162 lb 11.2 oz)    Intake/Output from previous day: 01/15 0701 - 01/16 0700 In: 3136.3 [P.O.:600; I.V.:2136.3; IV Piggyback:400] Out: 300  [Urine:300]  General appearance: alert, cooperative, appears stated age and no distress Head: Normocephalic, without obvious abnormality, atraumatic Resp: clear to auscultation bilaterally Cardio: regular rate and rhythm, S1, S2 normal, no murmur, click, rub or gallop GI: soft, non-tender; bowel sounds normal; no masses,  no organomegaly Extremities: Swelling is noted. Pulses not well palpated but good capillary reflex. Pulses: poorly palpable. Skin: scaly erythematous rash in both legs. Wet clear fluid seen weeping from leg. No fluctuant areas.  Neurologic: Alert and oriented x 3. No focal deficits.  Lab Results:  Basic Metabolic Panel:  Lab 09/29/12 4401 09/26/12 0435 09/25/12 1950  NA 138 138 140  K 3.7 4.0 4.2  CL 104 104 102  CO2 29 27 28   GLUCOSE 93 86 116*  BUN 15 26* 29*  CREATININE 0.75 0.83 0.88  CALCIUM 8.4 8.0* 9.1  MG -- -- --  PHOS -- -- --   Liver Function Tests:  Lab 09/26/12 0435 09/25/12 1950  AST 24 31  ALT 13 18  ALKPHOS 52 72  BILITOT 0.4 0.3  PROT 4.8* 6.4  ALBUMIN 2.6* 3.5   CBC:  Lab 09/30/12 0444 09/29/12 0832 09/28/12 0435 09/27/12 0404 09/26/12 0435  WBC 7.3 6.5 7.3 7.4 8.9  NEUTROABS -- -- -- -- 4.3  HGB 12.7* 12.3* 11.4* 11.0* 11.1*  HCT 38.1* 36.6* 34.0* 32.6* 32.8*  MCV 98.4 98.1 97.7 97.0 97.6  PLT 216 193 177 167 165   Cardiac Enzymes:  Lab 09/25/12 1950  CKTOTAL 176  CKMB --  CKMBINDEX --  TROPONINI --    Recent Results (from the past 240 hour(s))  CULTURE, BLOOD (ROUTINE X 2)     Status: Normal (Preliminary result)   Collection Time   09/25/12  7:45 PM      Component Value Range Status Comment   Specimen Description BLOOD LEFT FOREARM  3 ML IN [facility] BOTTLE   Final    Special Requests NONE   Final    Culture  Setup Time 09/25/2012 22:59   Final    Culture     Final    Value:        BLOOD CULTURE RECEIVED NO GROWTH TO DATE CULTURE WILL BE HELD FOR 5 DAYS BEFORE ISSUING A FINAL NEGATIVE REPORT   Report Status PENDING    Incomplete   CULTURE, BLOOD (ROUTINE X 2)     Status: Normal (Preliminary result)   Collection Time   09/25/12  7:50 PM      Component Value Range Status Comment   Specimen Description BLOOD RIGHT FOREARM  1 ML IN AEROBIC ONLY   Final    Special Requests NONE   Final    Culture  Setup Time 09/25/2012 22:59   Final    Culture     Final    Value:        BLOOD CULTURE RECEIVED NO GROWTH TO DATE CULTURE WILL BE HELD FOR 5 DAYS BEFORE ISSUING A FINAL NEGATIVE REPORT   Report Status PENDING   Incomplete       Studies/Results: No results found.  Medications:  Scheduled:    . cephALEXin  500  mg Oral Q6H  . doxycycline  100 mg Oral Q12H  . hydrocerin   Topical BID  . rivaroxaban  20 mg Oral Q supper  . rivaroxaban  15 mg Oral BID WC  . Tamsulosin HCl  0.4 mg Oral Daily  . vancomycin  1,250 mg Intravenous Q24H   Continuous:    . sodium chloride 75 mL/hr at 09/29/12 1909   VWU:[id number], acetaminophen, diphenhydrAMINE, oxyCODONE  Assessment/Plan:  Principal Problem:  *Erysipelas Active Problems:  Cellulitis  Venous stasis dermatitis  Hematuria  DVT of lower extremity (deep venous thrombosis)    Cellulitis vs Erysipelas Vs other dermatological condition I am not certain this is infectious. Could be a dermatological condition primarily perhaps with secondary bacterial infection. Might benefit from OP referral to Dermatology for skin biopsy. But patient feels that that may not be helpful. Unfortunately patient has preconceived notions about his medical problems. He gets very agaitated when i suggested that this may not be a true infection and could be a dermatological condition to begin with. I discussed with him regarding changing to oral antibiotics. His expectation is a complete 'cure' by the time he leaves the hospital. I have explained to him that this expectation may be unreasonable. He stated that the other doctor told him that this will be cured. This is a difficult  situation. Appreciate wound care consult. Will add oral doxycycline and Keflex (if this is erysipelas). Will leave on Vanc till tomorrow. Will get second opinion from surgery. Imaging studies considered but unlikely there is an abscess.  Left lower extremity DVT  Patient was observed for worsening of hematuria on Lovenox. This has not happened. Patient on Xarelto.   Hematuria  No further episodes. No abnormality on renal US.  Continue to monitor.  Code Status: full code  Family Communication: no family at bedside  Disposition Plan: home when stable   DVT Prophylaxis Xarelto   LOS: 5 days   [facility]  [facility] Pager [phone] 09/30/2012, 1:06 PM  If 8PM-8AM, please contact night-coverage at [email or web address], password [facility]

## 2012-10-01 ENCOUNTER — Inpatient Hospital Stay (HOSPITAL_COMMUNITY): Payer: Medicare Other

## 2012-10-01 DIAGNOSIS — A46 Erysipelas: Secondary | ICD-10-CM

## 2012-10-01 LAB — CULTURE, BLOOD (ROUTINE X 2): Culture: NO GROWTH

## 2012-10-01 MED ORDER — GADOBENATE DIMEGLUMINE 529 MG/ML IV SOLN
15.0000 mL | Freq: Once | INTRAVENOUS | Status: AC | PRN
  Administered 2012-10-01: 15 mL via INTRAVENOUS

## 2012-10-01 NOTE — Progress Notes (Signed)
MRI shows no drainable collection D/w Dr. Rito Ehrlich - nothing to offer surgically at this time Pt refusing surgical involvement/advice anyway Consider r/o DVT/venous insufficiency Agree w ID consult - - Dr. Kirtland Bouchard called  Ardeth Sportsman, M.D., F.A.C.S. Gastrointestinal and Minimally Invasive Surgery [facility] [address] [place], [place] [address] [phone] Main / Paging [phone] Voice Mail

## 2012-10-01 NOTE — Progress Notes (Signed)
[facility] PROGRESS NOTE  SUBHAN HOOPES IEP:[id number] DOB: 1928-03-20 DOA: 09/25/2012  PCP: Dr. Brynda Rim??  Brief HPI: 79 year old male with no major medical problems presents with concerns of right leg pain and cellulitis. The patient has had a very protracted history. Apparently, the patient has had multiple courses antibiotics for "cellulitis" of the right lower extremity which have included doxycycline, clindamycin, Augmentin, cephalexin, and Levaquin. The patient has had right lower extremity erythema and swelling since the beginning of 2013. Please note that during these previous episodes, the patient has never had any fevers, chills, or actual pain in his right lower extremity. In early November 2013, the patient was given a course of Augmentin for cellulitis of his right lower extremity again. Even prior to this course of antibiotic, the patient has had edema, erythema of his right lower extremity as well as some patchy areas of erythema on his right upper extremity. After taking the Augmentin 13 days, the patient noted increasing scaling, erythema, and scaly patches on his right lower extremity and right upper extremity. The patient was given clindamycin and then prednisone. This did not seem to help much. His erythematous patches on his right arm and edema and erythema on his right leg continued to progress despite prednisone. The patient was given levofloxacin on 08/18/2012. He stated that after one dose of the Levaquin, his scaly erythematous patches on his right upper extremity spread to his torso, flank, left upper extremity. In addition, the erythema and scaling in his right lower extremity also worsened. Since that period of time, the patient has been self-medicating himself with topical Neosporin as well as Preparation H with Biotin which he obtains from [place]. He states that this has not helped much. He is been using these topicals mainly for the complaint of pruritus. On most  days, the patient experiences pruritus on his arms, torso, and bilateral thighs. Please note the patient is not having significant pain except that in his ankle. He denies any recent injury or trauma. Since the Augmentin and Levaquin, the patient has noted increasing clear drainage from his right lower extremity without odor or pus. On 07/23/2012, the patient also had a nonsurgical/superficial culture performed on his right lower extremity drainage. It grew group A streptococcus and pseudomonas aeruginosa. Please note that during all his recurrent episodes of his "cellulitis" but the patient has never had any systemic toxicity, fever, chills, rigors, nausea, vomiting, diarrhea. In fact, the patient states that outside of the frustration from his right lower extremity and other rashes, he feels "pretty good". Patient denies any exotic travels. He only has one dog without any other exotic pets.  Past medical history:  Past Medical History  Diagnosis Date  . Scarlet fever   . Erysipelas     Consultants: Seen by Gen Surg yesterday. ID consulted today.  Procedures: None  Antibiotics: IV Vanc 1/11--> PO Keflex 1/16--> PO Doxycycline 1/16-->  Subjective: Patient states the right leg is no better. Still oozing and peeling.   Objective: Vital Signs  Filed Vitals:   09/30/12 0648 09/30/12 1400 09/30/12 2208 10/01/12 0540  BP: 121/67 148/88 125/74 132/76  Pulse: 68 80 80 83  Temp: 97.9 F (36.6 C) 97.7 F (36.5 C) 97.8 F (36.6 C) 98 F (36.7 C)  TempSrc: Oral Oral Oral Oral  Resp: 16 16 18 16   Height:      Weight:      SpO2: 97% 100% 97% 98%    Intake/Output Summary (Last 24 hours) at 10/01/12  1334 Last data filed at 10/01/12 1000  Gross per 24 hour  Intake   1920 ml  Output    200 ml  Net   1720 ml   Filed Weights   09/25/12 1900  Weight: 73.8 kg (162 lb 11.2 oz)    Intake/Output from previous day: 01/16 0701 - 01/17 0700 In: 1761.3 [P.O.:240; I.V.:1521.3] Out: 200  [Urine:200]  General appearance: alert, cooperative, appears stated age and no distress Head: Normocephalic, without obvious abnormality, atraumatic Resp: clear to auscultation bilaterally Cardio: regular rate and rhythm, S1, S2 normal, no murmur, click, rub or gallop GI: soft, non-tender; bowel sounds normal; no masses,  no organomegaly Extremities: Swelling is noted. Pulses not well palpated but good capillary reflex. Pulses: poorly palpable. Skin: 1/16 exam: scaly erythematous rash in both legs. Wet clear fluid seen weeping from leg. No fluctuant areas. 1/17: Dressing applied recently and was not removed. Neurologic: Alert and oriented x 3. No focal deficits.  Lab Results:  Basic Metabolic Panel:  Lab 09/29/12 1610 09/26/12 0435 09/25/12 1950  NA 138 138 140  K 3.7 4.0 4.2  CL 104 104 102  CO2 29 27 28   GLUCOSE 93 86 116*  BUN 15 26* 29*  CREATININE 0.75 0.83 0.88  CALCIUM 8.4 8.0* 9.1  MG -- -- --  PHOS -- -- --   Liver Function Tests:  Lab 09/26/12 0435 09/25/12 1950  AST 24 31  ALT 13 18  ALKPHOS 52 72  BILITOT 0.4 0.3  PROT 4.8* 6.4  ALBUMIN 2.6* 3.5   CBC:  Lab 09/30/12 0444 09/29/12 0832 09/28/12 0435 09/27/12 0404 09/26/12 0435  WBC 7.3 6.5 7.3 7.4 8.9  NEUTROABS -- -- -- -- 4.3  HGB 12.7* 12.3* 11.4* 11.0* 11.1*  HCT 38.1* 36.6* 34.0* 32.6* 32.8*  MCV 98.4 98.1 97.7 97.0 97.6  PLT 216 193 177 167 165   Cardiac Enzymes:  Lab 09/25/12 1950  CKTOTAL 176  CKMB --  CKMBINDEX --  TROPONINI --    Recent Results (from the past 240 hour(s))  CULTURE, BLOOD (ROUTINE X 2)     Status: Normal   Collection Time   09/25/12  7:45 PM      Component Value Range Status Comment   Specimen Description BLOOD LEFT FOREARM  3 ML IN [facility] BOTTLE   Final    Special Requests NONE   Final    Culture  Setup Time 09/25/2012 22:59   Final    Culture NO GROWTH 5 DAYS   Final    Report Status 10/01/2012 FINAL   Final   CULTURE, BLOOD (ROUTINE X 2)     Status: Normal    Collection Time   09/25/12  7:50 PM      Component Value Range Status Comment   Specimen Description BLOOD RIGHT FOREARM  1 ML IN AEROBIC ONLY   Final    Special Requests NONE   Final    Culture  Setup Time 09/25/2012 22:59   Final    Culture NO GROWTH 5 DAYS   Final    Report Status 10/01/2012 FINAL   Final       Studies/Results: Mr Tibia Fibula Right W Wo Contrast  10/01/2012  *RADIOLOGY REPORT*  Clinical Data:  Nonhealing cellulitis of the right lower leg. Erythema, swelling, and weeping.  MRI OF THE RIGHT LOWER LEG WITH AND WITHOUT CONTRAST  Technique:  Multiplanar, multisequence MR imaging of the right lower leg was performed before and after the administration of intravenous  contrast.  Contrast: 15mL MULTIHANCE GADOBENATE DIMEGLUMINE 529 MG/ML IV SOLN  Comparison:  Foot radiographs dated 09/25/2012  Findings: There is extensive subcutaneous edema circumferentially in the right lower leg and to a lesser degree in the left lower leg.  After contrast administration the subcutaneous tissues enhance on the right but do not enhance on the left.  The findings are consistent with diffuse cellulitis of the right lower leg.  There is no abscess.  There is no myositis or fasciitis.  There is no osteomyelitis.  There are no ankle or knee joint effusions. There are multiple dilated veins in the right lower leg.  IMPRESSION: Diffuse cellulitis of the right lower leg.  No myositis or osteomyelitis.  No abscesses.  Multiple enlarged veins.   Original Report Authenticated By: Francene Boyers, M.D.     Medications:  Scheduled:    . cephALEXin  500 mg Oral Q6H  . doxycycline  100 mg Oral Q12H  . hydrocerin   Topical BID  . rivaroxaban  20 mg Oral Q supper  . rivaroxaban  15 mg Oral BID WC  . Tamsulosin HCl  0.4 mg Oral Daily  . vancomycin  1,250 mg Intravenous Q24H   Continuous:    . sodium chloride 75 mL/hr at 10/01/12 0217   ZOX:[id number], acetaminophen, diphenhydrAMINE,  oxyCODONE  Assessment/Plan:  Principal Problem:  *Erysipelas Active Problems:  Cellulitis  Venous stasis dermatitis  Hematuria  DVT of lower extremity (deep venous thrombosis)    Cellulitis vs Erysipelas Vs other dermatological condition MRI does suggest diffuse cellulitis, no abscess. Hasn't improved with IV vanc. Keflex and Doxy initiated yesterday. Will get opinion from ID as well. Discussed with Dr. Luciana Axe.  Could also be a dermatological condition primarily perhaps with secondary bacterial infection. Might benefit from OP referral to Dermatology for skin biopsy. But patient feels that that may not be helpful. Unfortunately patient has preconceived notions about his medical problems. He gets very agaitated when i suggested that this may not be a true infection and could be a dermatological condition to begin with. I discussed with him regarding changing to oral antibiotics. His expectation is a complete 'cure' by the time he leaves the hospital. I have explained to him that this expectation may be unreasonable. Await ID input.  Left lower extremity DVT  Patient on Xarelto.   Hematuria  No further episodes. No abnormality on renal US.  Continue to monitor.  Code Status: full code  Family Communication: no family at bedside  Disposition Plan: home when stable. Hopefully soon.  DVT Prophylaxis Xarelto   LOS: 6 days   [facility]  [facility] Pager [phone] 10/01/2012, 1:34 PM  If 8PM-8AM, please contact night-coverage at [email or web address], password [facility]

## 2012-10-01 NOTE — Consult Note (Signed)
[facility]     Reason for Consult:Cellulitis    Referring Physician: Dr. Rito Ehrlich  Principal Problem:  *Erysipelas Active Problems:  Cellulitis  Venous stasis dermatitis  Hematuria  DVT of lower extremity (deep venous thrombosis)      . cephALEXin  500 mg Oral Q6H  . doxycycline  100 mg Oral Q12H  . hydrocerin   Topical BID  . rivaroxaban  20 mg Oral Q supper  . rivaroxaban  15 mg Oral BID WC  . Tamsulosin HCl  0.4 mg Oral Daily    Recommendations: Continue with doxycycline and keflex and monitor for improvement.  It may take several days to see some improvement.  Stop vancomycin (done)  A second or next option would be to take Zyvox.  Patient has been advised of the potential for side effects.  Dr. Daiva Eves available over the weekend if needed, otherwise I will follow up on Monday.  If patient is discharged prior to Monday, we will follow him in the office this week (please forward me discharge summary if that is the case)  Thanks for the consult  Assessment: Recurrent cellulitis with some likely drug allergy to levaquin.  Has not responded to several antibiotics.  He likely will need a prolonged course of po antibiotics.    Antibiotics: Doxycycline and Keflex day 1 Vancomycin 5 days  HPI: Justin [name] is a 79 y.o. male otherwise healthy presented to ED with right leg pain c/w cellulitis.  He has had several courses of antibiotics since November including Augmentin, levaquin and clindamycin and has had cellulitis in the past, treated successfully with oral antibiotics.  This time he really had no improvement with oral therapy.  He did get a rash as well after taking levaquin.  His leg has been notable for erythema, pain and weeping.  No fever or chills.  He has remained hemodynamically stable.  He also has had a course of prednisone for presumed allergic reaction.  He has tried topical therapies including neosporin and preparation H, but no  alternative therapies have helped.  No pus drainage.  He did have a superficial swab culture with GAS and Pseudomonas.  MRI has been done and is c/w cellulitis.     Review of Systems: Pertinent items are noted in HPI.  Past Medical History  Diagnosis Date  . Scarlet fever   . Erysipelas     History  Substance Use Topics  . Smoking status: Former Smoker    Quit date: 09/15/1962  . Smokeless tobacco: Never Used  . Alcohol Use: No    Family History  Problem Relation Age of Onset  . Heart disease Mother   . Cancer Mother     leukemia  . Diabetes Neg Hx    No Known Allergies  OBJECTIVE: Blood pressure 128/86, pulse 87, temperature 99 F (37.2 C), temperature source Oral, resp. rate 20, height 5\' 8"  (1.727 m), weight 162 lb 11.2 oz (73.8 kg), SpO2 98.00%. General: Awake, alert, oriented x 3  Skin: right leg with diffuse erythema, weeping with clear fluid, edema Also areas of erythema in hand and torso   Microbiology: Recent Results (from the past 240 hour(s))  CULTURE, BLOOD (ROUTINE X 2)     Status: Normal   Collection Time   09/25/12  7:45 PM      Component Value Range Status Comment   Specimen Description BLOOD LEFT FOREARM  3 ML IN [facility] BOTTLE   Final  Special Requests NONE   Final    Culture  Setup Time 09/25/2012 22:59   Final    Culture NO GROWTH 5 DAYS   Final    Report Status 10/01/2012 FINAL   Final   CULTURE, BLOOD (ROUTINE X 2)     Status: Normal   Collection Time   09/25/12  7:50 PM      Component Value Range Status Comment   Specimen Description BLOOD RIGHT FOREARM  1 ML IN AEROBIC ONLY   Final    Special Requests NONE   Final    Culture  Setup Time 09/25/2012 22:59   Final    Culture NO GROWTH 5 DAYS   Final    Report Status 10/01/2012 FINAL   Final     Staci Righter, MD [facility] [phone] pager  [phone] cell 10/01/2012, 5:03 PM

## 2012-10-02 LAB — CREATININE, SERUM: GFR calc Af Amer: >90 mL/min (ref >90)

## 2012-10-02 NOTE — Progress Notes (Signed)
[facility] PROGRESS NOTE  SHEA KAPUR XBJ:[id number] DOB: [birth year] DOA: 09/25/2012  PCP: Dr. Brynda Rim??  Brief HPI: 79 year old male with no major medical problems presents with concerns of right leg pain and cellulitis. The patient has had a very protracted history. Apparently, the patient has had multiple courses antibiotics for "cellulitis" of the right lower extremity which have included doxycycline, clindamycin, Augmentin, cephalexin, and Levaquin. The patient has had right lower extremity erythema and swelling since the beginning of 2013. Please note that during these previous episodes, the patient has never had any fevers, chills, or actual pain in his right lower extremity. In early November 2013, the patient was given a course of Augmentin for cellulitis of his right lower extremity again. Even prior to this course of antibiotic, the patient has had edema, erythema of his right lower extremity as well as some patchy areas of erythema on his right upper extremity. After taking the Augmentin 13 days, the patient noted increasing scaling, erythema, and scaly patches on his right lower extremity and right upper extremity. The patient was given clindamycin and then prednisone. This did not seem to help much. His erythematous patches on his right arm and edema and erythema on his right leg continued to progress despite prednisone. The patient was given levofloxacin on 08/18/2012. He stated that after one dose of the Levaquin, his scaly erythematous patches on his right upper extremity spread to his torso, flank, left upper extremity. In addition, the erythema and scaling in his right lower extremity also worsened. Since that period of time, the patient has been self-medicating himself with topical Neosporin as well as Preparation H with Biotin which he obtains from [place]. He states that this has not helped much. He is been using these topicals mainly for the complaint of pruritus. On most  days, the patient experiences pruritus on his arms, torso, and bilateral thighs. Please note the patient is not having significant pain except that in his ankle. He denies any recent injury or trauma. Since the Augmentin and Levaquin, the patient has noted increasing clear drainage from his right lower extremity without odor or pus. On 07/23/2012, the patient also had a nonsurgical/superficial culture performed on his right lower extremity drainage. It grew group A streptococcus and pseudomonas aeruginosa. Please note that during all his recurrent episodes of his "cellulitis" but the patient has never had any systemic toxicity, fever, chills, rigors, nausea, vomiting, diarrhea. In fact, the patient states that outside of the frustration from his right lower extremity and other rashes, he feels "pretty good". Patient denies any exotic travels. He only has one dog without any other exotic pets.  Past medical history:  Past Medical History  Diagnosis Date  . Scarlet fever   . Erysipelas     Consultants: Seen by Gen Surg yesterday. ID consulted today.  Procedures: None  Antibiotics: IV Vanc 1/11--> PO Keflex 1/16--> PO Doxycycline 1/16-->  Subjective: Patient reports slight decrease in the oozing from right leg. But mentions swelling of left leg now. Pain is under control.   Objective: Vital Signs  Filed Vitals:   10/01/12 0540 10/01/12 1400 10/01/12 2145 10/02/12 0532  BP: 132/76 128/86 124/70 122/76  Pulse: 83 87 92 86  Temp: 98 F (36.7 C) 99 F (37.2 C) 98.7 F (37.1 C) 97.6 F (36.4 C)  TempSrc: Oral Oral Oral Oral  Resp: 16 20 18 16   Height:      Weight:      SpO2: 98% 98% 97% 96%  Intake/Output Summary (Last 24 hours) at 10/02/12 1226 Last data filed at 10/02/12 1000  Gross per 24 hour  Intake   1740 ml  Output    300 ml  Net   1440 ml   Filed Weights   09/25/12 1900  Weight: 73.8 kg (162 lb 11.2 oz)    Intake/Output from previous day: 01/17 0701 - 01/18  0700 In: 2198.8 [P.O.:120; I.V.:2078.8] Out: 300 [Urine:300]  General appearance: alert, cooperative, appears stated age and no distress Head: Normocephalic, without obvious abnormality, atraumatic Resp: clear to auscultation bilaterally Cardio: regular rate and rhythm, S1, S2 normal, no murmur, click, rub or gallop GI: soft, non-tender; bowel sounds normal; no masses,  no organomegaly Extremities: Swelling is noted. Pulses not well palpated but good capillary reflex. Pulses: poorly palpable. Skin: 1/18 exam: Dressing removed from right leg: Erythematous with moistness. Scaly lesions in foot.  Macular rash noted in both thighs. Some redness and swelling seen now in left leg as well.  Neurologic: Alert and oriented x 3. No focal deficits.  Lab Results:  Basic Metabolic Panel:  Lab 10/02/12 9562 09/29/12 0832 09/26/12 0435 09/25/12 1950  NA -- 138 138 140  K -- 3.7 4.0 4.2  CL -- 104 104 102  CO2 -- 29 27 28   GLUCOSE -- 93 86 116*  BUN -- 15 26* 29*  CREATININE 0.76 0.75 0.83 0.88  CALCIUM -- 8.4 8.0* 9.1  MG -- -- -- --  PHOS -- -- -- --   Liver Function Tests:  Lab 09/26/12 0435 09/25/12 1950  AST 24 31  ALT 13 18  ALKPHOS 52 72  BILITOT 0.4 0.3  PROT 4.8* 6.4  ALBUMIN 2.6* 3.5   CBC:  Lab 09/30/12 0444 09/29/12 0832 09/28/12 0435 09/27/12 0404 09/26/12 0435  WBC 7.3 6.5 7.3 7.4 8.9  NEUTROABS -- -- -- -- 4.3  HGB 12.7* 12.3* 11.4* 11.0* 11.1*  HCT 38.1* 36.6* 34.0* 32.6* 32.8*  MCV 98.4 98.1 97.7 97.0 97.6  PLT 216 193 177 167 165   Cardiac Enzymes:  Lab 09/25/12 1950  CKTOTAL 176  CKMB --  CKMBINDEX --  TROPONINI --    Recent Results (from the past 240 hour(s))  CULTURE, BLOOD (ROUTINE X 2)     Status: Normal   Collection Time   09/25/12  7:45 PM      Component Value Range Status Comment   Specimen Description BLOOD LEFT FOREARM  3 ML IN [facility] BOTTLE   Final    Special Requests NONE   Final    Culture  Setup Time 09/25/2012 22:59   Final    Culture  NO GROWTH 5 DAYS   Final    Report Status 10/01/2012 FINAL   Final   CULTURE, BLOOD (ROUTINE X 2)     Status: Normal   Collection Time   09/25/12  7:50 PM      Component Value Range Status Comment   Specimen Description BLOOD RIGHT FOREARM  1 ML IN AEROBIC ONLY   Final    Special Requests NONE   Final    Culture  Setup Time 09/25/2012 22:59   Final    Culture NO GROWTH 5 DAYS   Final    Report Status 10/01/2012 FINAL   Final       Studies/Results: Mr Tibia Fibula Right W Wo Contrast  10/01/2012  *RADIOLOGY REPORT*  Clinical Data:  Nonhealing cellulitis of the right lower leg. Erythema, swelling, and weeping.  MRI OF THE RIGHT LOWER  LEG WITH AND WITHOUT CONTRAST  Technique:  Multiplanar, multisequence MR imaging of the right lower leg was performed before and after the administration of intravenous contrast.  Contrast: 15mL MULTIHANCE GADOBENATE DIMEGLUMINE 529 MG/ML IV SOLN  Comparison:  Foot radiographs dated 09/25/2012  Findings: There is extensive subcutaneous edema circumferentially in the right lower leg and to a lesser degree in the left lower leg.  After contrast administration the subcutaneous tissues enhance on the right but do not enhance on the left.  The findings are consistent with diffuse cellulitis of the right lower leg.  There is no abscess.  There is no myositis or fasciitis.  There is no osteomyelitis.  There are no ankle or knee joint effusions. There are multiple dilated veins in the right lower leg.  IMPRESSION: Diffuse cellulitis of the right lower leg.  No myositis or osteomyelitis.  No abscesses.  Multiple enlarged veins.   Original Report Authenticated By: Francene Boyers, M.D.     Medications:  Scheduled:    . cephALEXin  500 mg Oral Q6H  . doxycycline  100 mg Oral Q12H  . hydrocerin   Topical BID  . rivaroxaban  20 mg Oral Q supper  . rivaroxaban  15 mg Oral BID WC  . Tamsulosin HCl  0.4 mg Oral Daily   Continuous:    . sodium chloride 75 mL/hr (10/02/12  0331)   WUJ:[id number], acetaminophen, diphenhydrAMINE, oxyCODONE  Assessment/Plan:  Principal Problem:  *Erysipelas Active Problems:  Cellulitis  Venous stasis dermatitis  Hematuria  DVT of lower extremity (deep venous thrombosis)    Cellulitis vs Erysipelas Vs other dermatological condition MRI does suggest diffuse cellulitis, no abscess. Hasn't improved with IV vanc. Keflex and Doxy initiated 1/16. Appreciate ID input. It's possible he may be manifesting the same in the left leg.   Could also be a dermatological condition primarily perhaps with secondary bacterial infection. Might benefit from OP referral to Dermatology for skin biopsy. But patient feels that that may not be helpful. Patient declined dermatological input. Unfortunately patient has preconceived notions about his medical problems. He gets very agaitated when i suggested that this may not be a true infection and could be a dermatological condition to begin with. I discussed with him regarding changing to oral antibiotics. His expectation is a complete 'cure' by the time he leaves the hospital. I have explained to him that this expectation may be unreasonable.   Left lower extremity DVT  Continue Xarelto.   Hematuria  No further episodes. No abnormality on renal US.  Continue to monitor.  Code Status: full code  Family Communication: no family at bedside  Disposition Plan: home when stable. Hopefully soon.  DVT Prophylaxis Xarelto   LOS: 7 days   [facility]  [facility] Pager [phone] 10/02/2012, 12:26 PM  If 8PM-8AM, please contact night-coverage at [email or web address], password [facility]

## 2012-10-03 NOTE — Progress Notes (Signed)
[facility] PROGRESS NOTE  Justin [name] ZOX:[id number] DOB: [birth year] DOA: 09/25/2012  PCP: Dr. Brynda Rim??  Brief HPI: 79 year old male with no major medical problems presents with concerns of right leg pain and cellulitis. The patient has had a very protracted history. Apparently, the patient has had multiple courses antibiotics for "cellulitis" of the right lower extremity which have included doxycycline, clindamycin, Augmentin, cephalexin, and Levaquin. The patient has had right lower extremity erythema and swelling since the beginning of 2013. Please note that during these previous episodes, the patient has never had any fevers, chills, or actual pain in his right lower extremity. In early November 2013, the patient was given a course of Augmentin for cellulitis of his right lower extremity again. Even prior to this course of antibiotic, the patient has had edema, erythema of his right lower extremity as well as some patchy areas of erythema on his right upper extremity. After taking the Augmentin 13 days, the patient noted increasing scaling, erythema, and scaly patches on his right lower extremity and right upper extremity. The patient was given clindamycin and then prednisone. This did not seem to help much. His erythematous patches on his right arm and edema and erythema on his right leg continued to progress despite prednisone. The patient was given levofloxacin on 08/18/2012. He stated that after one dose of the Levaquin, his scaly erythematous patches on his right upper extremity spread to his torso, flank, left upper extremity. In addition, the erythema and scaling in his right lower extremity also worsened. Since that period of time, the patient has been self-medicating himself with topical Neosporin as well as Preparation H with Biotin which he obtains from [place]. He states that this has not helped much. He is been using these topicals mainly for the complaint of pruritus. On most  days, the patient experiences pruritus on his arms, torso, and bilateral thighs. Please note the patient is not having significant pain except that in his ankle. He denies any recent injury or trauma. Since the Augmentin and Levaquin, the patient has noted increasing clear drainage from his right lower extremity without odor or pus. On 07/23/2012, the patient also had a nonsurgical/superficial culture performed on his right lower extremity drainage. It grew group A streptococcus and pseudomonas aeruginosa. Please note that during all his recurrent episodes of his "cellulitis" but the patient has never had any systemic toxicity, fever, chills, rigors, nausea, vomiting, diarrhea. In fact, the patient states that outside of the frustration from his right lower extremity and other rashes, he feels "pretty good". Patient denies any exotic travels. He only has one dog without any other exotic pets.  Past medical history:  Past Medical History  Diagnosis Date  . Scarlet fever   . Erysipelas     Consultants: Seen by Gen Surg. ID following.  Procedures: None  Antibiotics: IV Vanc 1/11-->1/17 PO Keflex 1/16--> PO Doxycycline 1/16-->  Subjective: Patient reports no change in his right leg. Some decrease in swelling in left. No other complaints.   Objective: Vital Signs  Filed Vitals:   10/02/12 0532 10/02/12 1400 10/02/12 2210 10/03/12 0659  BP: 122/76 140/82 119/60 133/82  Pulse: 86 97 84 69  Temp: 97.6 F (36.4 C) 98.4 F (36.9 C) 97.5 F (36.4 C) 97.8 F (36.6 C)  TempSrc: Oral Oral Oral Oral  Resp: 16 16 18 18   Height:      Weight:      SpO2: 96% 99% 96% 100%    Intake/Output Summary (Last 24  hours) at 10/03/12 1124 Last data filed at 10/03/12 1038  Gross per 24 hour  Intake    885 ml  Output      0 ml  Net    885 ml   Filed Weights   09/25/12 1900  Weight: 73.8 kg (162 lb 11.2 oz)    Intake/Output from previous day: 01/18 0701 - 01/19 0700 In: 1125 [P.O.:600;  I.V.:525] Out: -   General appearance: alert, cooperative, appears stated age and no distress Resp: clear to auscultation bilaterally Cardio: regular rate and rhythm, S1, S2 normal, no murmur, click, rub or gallop GI: soft, non-tender; bowel sounds normal; no masses,  no organomegaly Extremities: Swelling is noted in both LE. Pulses not well palpated but good capillary reflex. Skin: 1/18 exam: Dressing removed from right leg: Erythematous with moistness. Scaly lesions in foot.  Macular rash noted in both thighs. Some redness and swelling seen now in left leg as well but less today.  Neurologic: Alert and oriented x 3. No focal deficits.  Lab Results:  Basic Metabolic Panel:  Lab 10/02/12 4782 09/29/12 0832  NA -- 138  K -- 3.7  CL -- 104  CO2 -- 29  GLUCOSE -- 93  BUN -- 15  CREATININE 0.76 0.75  CALCIUM -- 8.4  MG -- --  PHOS -- --   CBC:  Lab 09/30/12 0444 09/29/12 0832 09/28/12 0435 09/27/12 0404  WBC 7.3 6.5 7.3 7.4  NEUTROABS -- -- -- --  HGB 12.7* 12.3* 11.4* 11.0*  HCT 38.1* 36.6* 34.0* 32.6*  MCV 98.4 98.1 97.7 97.0  PLT 216 193 177 167    Recent Results (from the past 240 hour(s))  CULTURE, BLOOD (ROUTINE X 2)     Status: Normal   Collection Time   09/25/12  7:45 PM      Component Value Range Status Comment   Specimen Description BLOOD LEFT FOREARM  3 ML IN [facility] BOTTLE   Final    Special Requests NONE   Final    Culture  Setup Time 09/25/2012 22:59   Final    Culture NO GROWTH 5 DAYS   Final    Report Status 10/01/2012 FINAL   Final   CULTURE, BLOOD (ROUTINE X 2)     Status: Normal   Collection Time   09/25/12  7:50 PM      Component Value Range Status Comment   Specimen Description BLOOD RIGHT FOREARM  1 ML IN AEROBIC ONLY   Final    Special Requests NONE   Final    Culture  Setup Time 09/25/2012 22:59   Final    Culture NO GROWTH 5 DAYS   Final    Report Status 10/01/2012 FINAL   Final       Studies/Results: No results found.  Medications:   Scheduled:    . cephALEXin  500 mg Oral Q6H  . doxycycline  100 mg Oral Q12H  . hydrocerin   Topical BID  . rivaroxaban  20 mg Oral Q supper  . rivaroxaban  15 mg Oral BID WC   Continuous:   NFA:[id number], acetaminophen, diphenhydrAMINE, oxyCODONE  Assessment/Plan:  Principal Problem:  *Erysipelas Active Problems:  Cellulitis  Venous stasis dermatitis  Hematuria  DVT of lower extremity (deep venous thrombosis)    Cellulitis vs Erysipelas Vs other dermatological condition Not much change in the right leg. MRI does suggest diffuse cellulitis, no abscess. Didn't improve with IV vanc. Keflex and Doxy initiated 1/16. Appreciate ID input. It's possible  he may be manifesting the same in the left leg though the leg looks better today.   Could also be a dermatological condition primarily perhaps with secondary bacterial infection. Might benefit from OP referral to Dermatology for skin biopsy. But patient feels that that may not be helpful. Patient has declined dermatological input. Unfortunately patient has preconceived notions about his medical problems. He gets very agaitated when i suggested that this may not be a true infection and could be a dermatological condition to begin with. His expectation is a complete 'cure' by the time he leaves the hospital. I have explained to him that this expectation may be unreasonable.   Left lower extremity DVT  Continue Xarelto.   Hematuria  No further episodes. No abnormality on renal US.  Continue to monitor. Patient wants to discontinue Flomax. It was initiated presumably for enlarged prostate seen on [place]. He is concerned he is experiencing side effects from Flomax.  Code Status: full code  Family Communication: no family at bedside  Disposition Plan: home when cellulitis starts showing improvement.  DVT Prophylaxis On Xarelto   LOS: 8 days   [facility]  [facility] Pager [phone] 10/03/2012, 11:24 AM  If 8PM-8AM,  please contact night-coverage at [email or web address], password [facility]

## 2012-10-03 NOTE — Plan of Care (Signed)
Problem: Phase I Progression Outcomes Goal: Wound assessment- dressing change as appropriate Outcome: Progressing Mild improvement in swelling, skin very inflamed, skin scabbed in some areas, moist-wet in other areas . dsg change with saline-pat dry and eucerin cream twice daily. Tolerating well.

## 2012-10-04 ENCOUNTER — Inpatient Hospital Stay (HOSPITAL_COMMUNITY): Payer: Medicare Other

## 2012-10-04 DIAGNOSIS — L0390 Cellulitis, unspecified: Secondary | ICD-10-CM

## 2012-10-04 DIAGNOSIS — I824Z9 Acute embolism and thrombosis of unspecified deep veins of unspecified distal lower extremity: Secondary | ICD-10-CM

## 2012-10-04 DIAGNOSIS — L0291 Cutaneous abscess, unspecified: Secondary | ICD-10-CM

## 2012-10-04 LAB — BASIC METABOLIC PANEL
BUN: 20 mg/dL (ref 6–23)
CO2: 29 mEq/L (ref 19–32)
Chloride: 104 mEq/L (ref 96–112)
Glucose, Bld: 95 mg/dL (ref 70–99)
Potassium: 4.2 mEq/L (ref 3.5–5.1)

## 2012-10-04 LAB — CBC
HCT: 36.2 % — ABNORMAL LOW (ref 39.0–52.0)
Hemoglobin: 12.2 g/dL — ABNORMAL LOW (ref 13.0–17.0)
MCHC: 33.7 g/dL (ref 30.0–36.0)
RBC: 3.67 MIL/uL — ABNORMAL LOW (ref 4.22–5.81)
WBC: 6.9 10*3/uL (ref 4.0–10.5)

## 2012-10-04 NOTE — Progress Notes (Signed)
ANTICOAGULATION CONSULT NOTE  Pharmacy Consult for Xarelto Indication: LE DVT  No Known Allergies  Patient Measurements: Height: 5\' 8"  (172.7 cm) Weight: 162 lb 11.2 oz (73.8 kg) IBW/kg (Calculated) : 68.4   Vital Signs: Temp: 97.7 F (36.5 C) (01/20 0623) Temp src: Oral (01/20 0623) BP: 123/78 mmHg (01/20 0623) Pulse Rate: 79  (01/20 0623)  Labs:  Basename 10/04/12 0401 10/02/12 0529  HGB 12.2* --  HCT 36.2* --  PLT 230 --  APTT -- --  LABPROT -- --  INR -- --  HEPARINUNFRC -- --  CREATININE 0.84 0.76  CKTOTAL -- --  CKMB -- --  TROPONINI -- --    Estimated Creatinine Clearance: 65.6 ml/min (by C-G formula based on Cr of 0.84).   Medical History: Past Medical History  Diagnosis Date  . Scarlet fever   . Erysipelas     Assessment:  79 yo M admitted with hematuria and RLE cellulitis / venous stasis dermatitis, was found to have LLE DVT.  Started on Lovenox 1/12, began bridging to warfarin 1/14.  On the evening of 1/15 Xarelto was started, Lovenox and warfarin were discontinued.  SCr stable, CrCl > 30 mL/min  Not on medications that interact significantly with Xarelto.  No further hematuria reported.    H/H stable.   Plan:   Continue Xarelto 15 mg twice daily with food for 21 days total (through 10/20/12).  On 10/21/12, begin Xarelto 20 mg once daily with evening meal.   Xarelto education was completed on 1/16.   Elie Goody, PharmD, BCPS Pager: [phone] 10/04/2012  9:35 AM

## 2012-10-04 NOTE — Progress Notes (Signed)
    [facility]  Date of Admission:  09/25/2012  Antibiotics: Doxycycline and Keflex day 4 Vancomycin 5 days   Subjective: Left leg now with some erythema  Objective: Temp:  [97.6 F (36.4 C)-98.4 F (36.9 C)] 97.7 F (36.5 C) (01/20 0623) Pulse Rate:  [71-79] 79  (01/20 0623) Resp:  [17-18] 18  (01/20 0623) BP: (118-129)/(56-78) 123/78 mmHg (01/20 0623) SpO2:  [97 %-99 %] 97 % (01/20 0623)  General: Awake, alert, nad Skin: right leg with less weeping, some slight decrease in erythema of foot; new erythema of left shin, warm, non tender   Lab Results Lab Results  Component Value Date   WBC 6.9 10/04/2012   HGB 12.2* 10/04/2012   HCT 36.2* 10/04/2012   MCV 98.6 10/04/2012   PLT 230 10/04/2012    Lab Results  Component Value Date   CREATININE 0.84 10/04/2012   BUN 20 10/04/2012   NA 140 10/04/2012   K 4.2 10/04/2012   CL 104 10/04/2012   CO2 29 10/04/2012    Lab Results  Component Value Date   ALT 13 09/26/2012   AST 24 09/26/2012   ALKPHOS 52 09/26/2012   BILITOT 0.4 09/26/2012      Microbiology: Recent Results (from the past 240 hour(s))  CULTURE, BLOOD (ROUTINE X 2)     Status: Normal   Collection Time   09/25/12  7:45 PM      Component Value Range Status Comment   Specimen Description BLOOD LEFT FOREARM  3 ML IN [facility] BOTTLE   Final    Special Requests NONE   Final    Culture  Setup Time 09/25/2012 22:59   Final    Culture NO GROWTH 5 DAYS   Final    Report Status 10/01/2012 FINAL   Final   CULTURE, BLOOD (ROUTINE X 2)     Status: Normal   Collection Time   09/25/12  7:50 PM      Component Value Range Status Comment   Specimen Description BLOOD RIGHT FOREARM  1 ML IN AEROBIC ONLY   Final    Special Requests NONE   Final    Culture  Setup Time 09/25/2012 22:59   Final    Culture NO GROWTH 5 DAYS   Final    Report Status 10/01/2012 FINAL   Final     Studies/Results: No results found.  Assessment/Plan: 1) cellulitis - seems to have  some moderate improvement on current therapy.  I have discussed with him that it likely will take weeks to get better.   -I agree with ultrasound of left leg  Staci Righter, MD [facility] [phone] pager   10/04/2012, 12:26 PM

## 2012-10-04 NOTE — Progress Notes (Signed)
[facility] PROGRESS NOTE  Justin [name] VWU:[id number] DOB: 1928/04/25 DOA: 09/25/2012  PCP: Dr. Brynda Rim??  Brief HPI: 79 year old male with no major medical problems presents with concerns of right leg pain and cellulitis. The patient has had a very protracted history. Apparently, the patient has had multiple courses antibiotics for "cellulitis" of the right lower extremity which have included doxycycline, clindamycin, Augmentin, cephalexin, and Levaquin. The patient has had right lower extremity erythema and swelling since the beginning of 2013. Please note that during these previous episodes, the patient has never had any fevers, chills, or actual pain in his right lower extremity. In early November 2013, the patient was given a course of Augmentin for cellulitis of his right lower extremity again. Even prior to this course of antibiotic, the patient has had edema, erythema of his right lower extremity as well as some patchy areas of erythema on his right upper extremity. After taking the Augmentin 13 days, the patient noted increasing scaling, erythema, and scaly patches on his right lower extremity and right upper extremity. The patient was given clindamycin and then prednisone. This did not seem to help much. His erythematous patches on his right arm and edema and erythema on his right leg continued to progress despite prednisone. The patient was given levofloxacin on 08/18/2012. He stated that after one dose of the Levaquin, his scaly erythematous patches on his right upper extremity spread to his torso, flank, left upper extremity. In addition, the erythema and scaling in his right lower extremity also worsened. Since that period of time, the patient has been self-medicating himself with topical Neosporin as well as Preparation H with Biotin which he obtains from [place]. He states that this has not helped much. He is been using these topicals mainly for the complaint of pruritus. On most  days, the patient experiences pruritus on his arms, torso, and bilateral thighs. Please note the patient is not having significant pain except that in his ankle. He denies any recent injury or trauma. Since the Augmentin and Levaquin, the patient has noted increasing clear drainage from his right lower extremity without odor or pus. On 07/23/2012, the patient also had a nonsurgical/superficial culture performed on his right lower extremity drainage. It grew group A streptococcus and pseudomonas aeruginosa. Please note that during all his recurrent episodes of his "cellulitis" but the patient has never had any systemic toxicity, fever, chills, rigors, nausea, vomiting, diarrhea. In fact, the patient states that outside of the frustration from his right lower extremity and other rashes, he feels "pretty good". Patient denies any exotic travels. He only has one dog without any other exotic pets.  Past medical history:  Past Medical History  Diagnosis Date  . Scarlet fever   . Erysipelas     Consultants: Seen by Gen Surg. ID following.  Procedures: None  Antibiotics: IV Vanc 1/11-->1/17 PO Keflex 1/16--> PO Doxycycline 1/16-->  Subjective: Patient feels the right leg is doing better. Concerned about left leg which is painful and swollen.   Objective: Vital Signs  Filed Vitals:   10/03/12 0659 10/03/12 1402 10/03/12 2052 10/04/12 0623  BP: 133/82 129/65 118/56 123/78  Pulse: 69 71 78 79  Temp: 97.8 F (36.6 C) 97.6 F (36.4 C) 98.4 F (36.9 C) 97.7 F (36.5 C)  TempSrc: Oral Oral Oral Oral  Resp: 18 17 18 18   Height:      Weight:      SpO2: 100% 99% 99% 97%    Intake/Output Summary (Last 24  hours) at 10/04/12 1218 Last data filed at 10/04/12 0849  Gross per 24 hour  Intake    720 ml  Output      0 ml  Net    720 ml   Filed Weights   09/25/12 1900  Weight: 73.8 kg (162 lb 11.2 oz)    Intake/Output from previous day: 01/19 0701 - 01/20 0700 In: 780 [P.O.:780] Out: -    General appearance: alert, cooperative, appears stated age and no distress Resp: clear to auscultation bilaterally Cardio: regular rate and rhythm, S1, S2 normal, no murmur, click, rub or gallop GI: soft, non-tender; bowel sounds normal; no masses,  no organomegaly Extremities: Right Leg: Slightly improved erythema and swelling. Less moist. Left Leg: Swelling more so laterally. And tender to palpation. Pulses not well palpated. Skin: 1/20 exam: Dressing removed from right leg: Less Erythematous and less moist. Scaly lesions in foot.  Macular rash noted in both thighs improving. Redness and swelling seen in left leg as well but less today. Neurologic: Alert and oriented x 3. No focal deficits.  Lab Results:  Basic Metabolic Panel:  Lab 10/04/12 1610 10/02/12 0529 09/29/12 0832  NA 140 -- 138  K 4.2 -- 3.7  CL 104 -- 104  CO2 29 -- 29  GLUCOSE 95 -- 93  BUN 20 -- 15  CREATININE 0.84 0.76 0.75  CALCIUM 8.9 -- 8.4  MG -- -- --  PHOS -- -- --   CBC:  Lab 10/04/12 0401 09/30/12 0444 09/29/12 0832 09/28/12 0435  WBC 6.9 7.3 6.5 7.3  NEUTROABS -- -- -- --  HGB 12.2* 12.7* 12.3* 11.4*  HCT 36.2* 38.1* 36.6* 34.0*  MCV 98.6 98.4 98.1 97.7  PLT 230 216 193 177    Recent Results (from the past 240 hour(s))  CULTURE, BLOOD (ROUTINE X 2)     Status: Normal   Collection Time   09/25/12  7:45 PM      Component Value Range Status Comment   Specimen Description BLOOD LEFT FOREARM  3 ML IN [facility] BOTTLE   Final    Special Requests NONE   Final    Culture  Setup Time 09/25/2012 22:59   Final    Culture NO GROWTH 5 DAYS   Final    Report Status 10/01/2012 FINAL   Final   CULTURE, BLOOD (ROUTINE X 2)     Status: Normal   Collection Time   09/25/12  7:50 PM      Component Value Range Status Comment   Specimen Description BLOOD RIGHT FOREARM  1 ML IN AEROBIC ONLY   Final    Special Requests NONE   Final    Culture  Setup Time 09/25/2012 22:59   Final    Culture NO GROWTH 5 DAYS   Final     Report Status 10/01/2012 FINAL   Final       Studies/Results: No results found.  Medications:  Scheduled:    . cephALEXin  500 mg Oral Q6H  . doxycycline  100 mg Oral Q12H  . hydrocerin   Topical BID  . rivaroxaban  20 mg Oral Q supper  . rivaroxaban  15 mg Oral BID WC   Continuous:   RUE:[id number], acetaminophen, diphenhydrAMINE, oxyCODONE  Assessment/Plan:  Principal Problem:  *Erysipelas Active Problems:  Cellulitis  Venous stasis dermatitis  Hematuria  DVT of lower extremity (deep venous thrombosis)    Cellulitis vs Erysipelas Vs other dermatological condition Right leg seems to be improving finally.  MRI  does suggest diffuse cellulitis, no abscess. Didn't improve with IV vanc. Keflex and Doxy initiated 1/16. Appreciate ID input. It's possible he may be manifesting the same in the left leg. Could be hematoma as well. Will get [place].   His expectation is a complete 'cure' by the time he leaves the hospital. I have explained to him that this expectation may be unreasonable.   Left lower extremity DVT  Continue Xarelto.   Hematuria  No further episodes. No abnormality on renal US.  Continue to monitor. Flomax discontinued 1/19. See 1/19 note.  Code Status: full code  Family Communication: no family at bedside  Disposition Plan: home when cellulitis starts showing improvement. Hopefully soon.  DVT Prophylaxis On Xarelto   LOS: 9 days   [facility]  [facility] Pager [phone] 10/04/2012, 12:18 PM  If 8PM-8AM, please contact night-coverage at [email or web address], password [facility]

## 2012-10-05 MED ORDER — CEPHALEXIN 500 MG PO CAPS: 500.0000 mg | ORAL_CAPSULE | Freq: Four times a day (QID) | ORAL | Status: DC

## 2012-10-05 MED ORDER — RIVAROXABAN 15 MG PO TABS: 15.0000 mg | ORAL_TABLET | Freq: Two times a day (BID) | ORAL | Status: DC

## 2012-10-05 MED ORDER — DOXYCYCLINE HYCLATE 100 MG PO TABS: 100.0000 mg | ORAL_TABLET | Freq: Two times a day (BID) | ORAL | Status: DC

## 2012-10-05 MED ORDER — RIVAROXABAN 20 MG PO TABS: 20.0000 mg | ORAL_TABLET | Freq: Every day | ORAL | Status: DC

## 2012-10-05 MED ORDER — OXYCODONE HCL 5 MG PO TABS: 5.0000 mg | ORAL_TABLET | ORAL | Status: DC | PRN

## 2012-10-05 NOTE — Progress Notes (Signed)
[facility]  Date of Admission:  09/25/2012  Antibiotics: Doxycycline and Keflex day 5 Vancomycin 5 days   Subjective: No new complaints  Objective: Temp:  [97.6 F (36.4 C)-98.3 F (36.8 C)] 97.8 F (36.6 C) (01/21 0604) Pulse Rate:  [73-83] 75  (01/21 0604) Resp:  [18-20] 18  (01/21 0604) BP: (106-156)/(58-84) 106/58 mmHg (01/21 0604) SpO2:  [96 %-100 %] 96 % (01/21 0604)  General: Awake, alert, nad Skin: right leg with less weeping, some slight decrease in erythema of foot; new erythema of left shin, warm, non tender   Lab Results Lab Results  Component Value Date   WBC 6.9 10/04/2012   HGB 12.2* 10/04/2012   HCT 36.2* 10/04/2012   MCV 98.6 10/04/2012   PLT 230 10/04/2012    Lab Results  Component Value Date   CREATININE 0.84 10/04/2012   BUN 20 10/04/2012   NA 140 10/04/2012   K 4.2 10/04/2012   CL 104 10/04/2012   CO2 29 10/04/2012    Lab Results  Component Value Date   ALT 13 09/26/2012   AST 24 09/26/2012   ALKPHOS 52 09/26/2012   BILITOT 0.4 09/26/2012      Microbiology: Recent Results (from the past 240 hour(s))  CULTURE, BLOOD (ROUTINE X 2)     Status: Normal   Collection Time   09/25/12  7:45 PM      Component Value Range Status Comment   Specimen Description BLOOD LEFT FOREARM  3 ML IN [facility] BOTTLE   Final    Special Requests NONE   Final    Culture  Setup Time 09/25/2012 22:59   Final    Culture NO GROWTH 5 DAYS   Final    Report Status 10/01/2012 FINAL   Final   CULTURE, BLOOD (ROUTINE X 2)     Status: Normal   Collection Time   09/25/12  7:50 PM      Component Value Range Status Comment   Specimen Description BLOOD RIGHT FOREARM  1 ML IN AEROBIC ONLY   Final    Special Requests NONE   Final    Culture  Setup Time 09/25/2012 22:59   Final    Culture NO GROWTH 5 DAYS   Final    Report Status 10/01/2012 FINAL   Final     Studies/Results: [place] Extrem Low Left Comp  10/04/2012  *RADIOLOGY REPORT*  Clinical Data:  79 year old male in patient with cutaneous of focal left lower extremity swelling.  Unclear clinically if this is infection or hematoma.  ULTRASOUND LEFT LOWER EXTREMITY LIMITED  Technique:  Ultrasound examination of the region of interest in the left lower extremity was performed.  Comparison:  Lower extremity MRI 10/01/2012.  Findings: Diffuse subcutaneous edema identified from the left knee to the left ankle.  No focal fluid collection or soft tissue mass. No drainable fluid.  IMPRESSION: Diffuse subcutaneous edema along the left lower extremity from the knee to the ankle with no focal fluid collection or mass.   Original Report Authenticated By: Erskine Speed, M.D.     Assessment/Plan: 1) cellulitis - continues to have some moderate improvement on current therapy.  I have discussed with him that it likely will take weeks to get better.   - left leg ultrasound ok -I am happy to arrange follow up at [facility] if he wants but at this time he has refused follow up. I suspect this will take at least 1 month to improve though  will need some monitoring if possible.    Staci Righter, MD [facility] for [facility] [phone] pager   10/05/2012, 1:30 PM

## 2012-10-05 NOTE — Discharge Summary (Signed)
[facility]  Physician Discharge Summary   Patient ID: Justin [name] MRN: [id number] DOB/AGE: May 17, 1928 79 y.o.  Admit date: 09/25/2012 Discharge date: 10/05/2012  PCP: None. He follows with urgent care as needed  DISCHARGE DIAGNOSES:  Principal Problem:  *Erysipelas Active Problems:  Cellulitis  Venous stasis dermatitis  Hematuria  DVT of lower extremity (deep venous thrombosis)   RECOMMENDATIONS FOR OUTPATIENT FOLLOW UP: Needs to follow up with Dr. Luciana Axe next week.  DISCHARGE CONDITION: fair  Diet recommendation: As before  Filed Weights   09/25/12 1900  Weight: 73.8 kg (162 lb 11.2 oz)    INITIAL HISTORY: 79 year old male with no major medical problems presents with concerns of right leg pain and cellulitis. The patient has had a very protracted history. Apparently, the patient has had multiple courses antibiotics for "cellulitis" of the right lower extremity which have included doxycycline, clindamycin, Augmentin, cephalexin, and Levaquin. The patient has had right lower extremity erythema and swelling since the beginning of 2013. Please note that during these previous episodes, the patient has never had any fevers, chills, or actual pain in his right lower extremity. In early November 2013, the patient was given a course of Augmentin for cellulitis of his right lower extremity again. Even prior to this course of antibiotic, the patient has had edema, erythema of his right lower extremity as well as some patchy areas of erythema on his right upper extremity. After taking the Augmentin 13 days, the patient noted increasing scaling, erythema, and scaly patches on his right lower extremity and right upper extremity. The patient was given clindamycin and then prednisone. This did not seem to help much. His erythematous patches on his right arm and edema and erythema on his right leg continued to progress despite prednisone. The patient was given levofloxacin on 08/18/2012.  He stated that after one dose of the Levaquin, his scaly erythematous patches on his right upper extremity spread to his torso, flank, left upper extremity. In addition, the erythema and scaling in his right lower extremity also worsened. Since that period of time, the patient has been self-medicating himself with topical Neosporin as well as Preparation H with Biotin which he obtains from [place]. He states that this has not helped much. He is been using these topicals mainly for the complaint of pruritus. On most days, the patient experiences pruritus on his arms, torso, and bilateral thighs. Please note the patient is not having significant pain except that in his ankle. He denies any recent injury or trauma. Since the Augmentin and Levaquin, the patient has noted increasing clear drainage from his right lower extremity without odor or pus. On 07/23/2012, the patient also had a nonsurgical/superficial culture performed on his right lower extremity drainage. It grew group A streptococcus and pseudomonas aeruginosa. Please note that during all his recurrent episodes of his "cellulitis" but the patient has never had any systemic toxicity, fever, chills, rigors, nausea, vomiting, diarrhea. In fact, the patient states that outside of the frustration from his right lower extremity and other rashes, he feels "pretty good". Patient denies any exotic travels. He only has one dog without any other exotic pets.   Consultations:  General surgery  ID  Procedures: None  HOSPITAL COURSE:   Cellulitis vs Erysipelas Vs other dermatological condition  Patient has not experienced much improvement over the course of his hospitalization. He was initially started on Vancomycin which after 5 days did not improve the infection. He was then switched to Doxycycline and Keflex. He was  seen aby general surgery and ID. He underwent MRI which showed only the cellulitis. He is experiencing a slow progress. Patient is frustrated  by this. He also developed left leg pain and swelling. [place] did not reveal any fluid collection. This could also be erysipelas or other dermatological condition. Patient is not interested in seeking an opinion from a dermatologist. At this time plan is to continue antibiotics at home and have a close follow up with Dr. Luciana Axe in his office. Continue wound care at home. Patient also developed another rash on both thighs which was thought to be due to Levaquin. That rash is improving.  Left lower extremity DVT  DVT was detected on doppler study. He was initially started on Lovenox and then switched to Xarelto. Patient is very hesitant to use allopathic medications if he can help it. He was finally agreeable to Xarelto. Case manager to assist with same. Reason for his DVt is not clear. Could have been due to decreased level of activity from cellulitis. But unable to rule out hypercoagulability. He will need further work up for this.  Hematuria while on Heparin No further episodes. No abnormality on renal US. Continue to monitor. Flomax discontinued 1/19. See 1/19 note. He should follow up with a PCP for same. Might need OP referral to Urology.  Patient has had an extensive work up. Cellulitis will take a long time to get better as he has been told many times. He is stable for discharge. Dr. Luciana Axe agrees. Patient however is dissatisfied. He might appeal his discharge.   PERTINENT LABS:  The results of significant diagnostics from this hospitalization (including imaging, microbiology, ancillary and laboratory) are listed below for reference.    Microbiology: Recent Results (from the past 240 hour(s))  CULTURE, BLOOD (ROUTINE X 2)     Status: Normal   Collection Time   09/25/12  7:45 PM      Component Value Range Status Comment   Specimen Description BLOOD LEFT FOREARM  3 ML IN [facility] BOTTLE   Final    Special Requests NONE   Final    Culture  Setup Time 09/25/2012 22:59   Final    Culture NO GROWTH 5  DAYS   Final    Report Status 10/01/2012 FINAL   Final   CULTURE, BLOOD (ROUTINE X 2)     Status: Normal   Collection Time   09/25/12  7:50 PM      Component Value Range Status Comment   Specimen Description BLOOD RIGHT FOREARM  1 ML IN AEROBIC ONLY   Final    Special Requests NONE   Final    Culture  Setup Time 09/25/2012 22:59   Final    Culture NO GROWTH 5 DAYS   Final    Report Status 10/01/2012 FINAL   Final      Labs: Basic Metabolic Panel:  Lab 10/04/12 1610 10/02/12 0529 09/29/12 0832  NA 140 -- 138  K 4.2 -- 3.7  CL 104 -- 104  CO2 29 -- 29  GLUCOSE 95 -- 93  BUN 20 -- 15  CREATININE 0.84 0.76 0.75  CALCIUM 8.9 -- 8.4  MG -- -- --  PHOS -- -- --   CBC:  Lab 10/04/12 0401 09/30/12 0444 09/29/12 0832  WBC 6.9 7.3 6.5  NEUTROABS -- -- --  HGB 12.2* 12.7* 12.3*  HCT 36.2* 38.1* 36.6*  MCV 98.6 98.4 98.1  PLT 230 216 193    IMAGING STUDIES Dg Ankle 2 Views  Right  09/25/2012  *RADIOLOGY REPORT*  Clinical Data: 79 year old male with edema pain cellulitis.  RIGHT ANKLE - 2 VIEW  Comparison: None.  Findings: Moderate to severe generalized soft tissue swelling about the visible foot and ankle.  The findings may be maximal at the dorsum of the foot.  Mortise joint alignment preserved. Bone mineralization is within normal limits.  Calcaneus intact.  No fracture identified.  No definite subcutaneous gas or retained foreign body identified.  IMPRESSION: Severe soft tissue swelling, appears maximal at the dorsum of the foot. No acute fracture or dislocation identified about the right ankle.   Original Report Authenticated By: Erskine Speed, M.D.    US Renal  09/26/2012  *RADIOLOGY REPORT*  Clinical Data: Hematuria.  RENAL/URINARY TRACT ULTRASOUND COMPLETE  Comparison:  None.  Findings:  Right Kidney:  Normal.  10.0 cm in length.  Left Kidney:  Normal.  10.1 cm in length.  Bladder:  Normal.  Bilateral ureteral jets identified.  The prostate gland is enlarged measuring 7.3 x 7.2 x  6.2 cm.  12 mm calcification in the prostate.  IMPRESSION:  1.  Normal kidneys. 2.  No renal calculi or hydronephrosis. 3.  Enlarged prostate gland.   Original Report Authenticated By: Francene Boyers, M.D.    Mr Tibia Fibula Right W Wo Contrast  10/01/2012  *RADIOLOGY REPORT*  Clinical Data:  Nonhealing cellulitis of the right lower leg. Erythema, swelling, and weeping.  MRI OF THE RIGHT LOWER LEG WITH AND WITHOUT CONTRAST  Technique:  Multiplanar, multisequence MR imaging of the right lower leg was performed before and after the administration of intravenous contrast.  Contrast: 15mL MULTIHANCE GADOBENATE DIMEGLUMINE 529 MG/ML IV SOLN  Comparison:  Foot radiographs dated 09/25/2012  Findings: There is extensive subcutaneous edema circumferentially in the right lower leg and to a lesser degree in the left lower leg.  After contrast administration the subcutaneous tissues enhance on the right but do not enhance on the left.  The findings are consistent with diffuse cellulitis of the right lower leg.  There is no abscess.  There is no myositis or fasciitis.  There is no osteomyelitis.  There are no ankle or knee joint effusions. There are multiple dilated veins in the right lower leg.  IMPRESSION: Diffuse cellulitis of the right lower leg.  No myositis or osteomyelitis.  No abscesses.  Multiple enlarged veins.   Original Report Authenticated By: Francene Boyers, M.D.    [place] Extrem Low Left Comp  10/04/2012  *RADIOLOGY REPORT*  Clinical Data: 79 year old male in patient with cutaneous of focal left lower extremity swelling.  Unclear clinically if this is infection or hematoma.  ULTRASOUND LEFT LOWER EXTREMITY LIMITED  Technique:  Ultrasound examination of the region of interest in the left lower extremity was performed.  Comparison:  Lower extremity MRI 10/01/2012.  Findings: Diffuse subcutaneous edema identified from the left knee to the left ankle.  No focal fluid collection or soft tissue mass. No drainable fluid.   IMPRESSION: Diffuse subcutaneous edema along the left lower extremity from the knee to the ankle with no focal fluid collection or mass.   Original Report Authenticated By: Erskine Speed, M.D.    Dg Foot 2 Views Right  09/25/2012  *RADIOLOGY REPORT*  Clinical Data: 79 year old male pain swelling erythema cellulitis.  RIGHT FOOT - 2 VIEW  Comparison: Right ankle series from the same day.  Findings: Severe soft tissue swelling about the foot, especially the dorsum of the foot at the metatarsal level.  No definite subcutaneous gas. No radiopaque foreign body identified. Underlying bone mineralization within normal limits.  No osteolysis, fracture or dislocation identified.  IMPRESSION: Severe soft tissue swelling.  No underlying acute osseous abnormality identified in the right foot.   Original Report Authenticated By: Erskine Speed, M.D.     DISCHARGE EXAMINATION: See note from earlier today.  DISPOSITION: Home  Discharge Orders    Future Orders Please Complete By Expires   Diet general      Increase activity slowly      Discharge instructions      Comments:   Please follow up with Dr. Luciana Axe next week. Wound care as before. You will need to follow up with a primary care physician to determine what may have caused the blood clot. You might need a hypercoagulable work up.     Current Discharge Medication List    START taking these medications   Details  cephALEXin (KEFLEX) 500 MG capsule Take 1 capsule (500 mg total) by mouth every 6 (six) hours. Qty: 56 capsule, Refills: 0    doxycycline (VIBRA-TABS) 100 MG tablet Take 1 tablet (100 mg total) by mouth every 12 (twelve) hours. Qty: 28 tablet, Refills: 0    oxyCODONE (OXY IR/ROXICODONE) 5 MG immediate release tablet Take 1 tablet (5 mg total) by mouth every 4 (four) hours as needed for pain. Qty: 30 tablet, Refills: 0    !! Rivaroxaban (XARELTO) 15 MG TABS tablet Take 1 tablet (15 mg total) by mouth 2 (two) times daily with a meal. Stop on  10/20/12 Qty: 32 tablet, Refills: 0    !! Rivaroxaban (XARELTO) 20 MG TABS Take 1 tablet (20 mg total) by mouth daily with supper. Starting 10/21/12 Qty: 30 tablet, Refills: 2     !! - Potential duplicate medications found. Please discuss with provider.    CONTINUE these medications which have NOT CHANGED   Details  CINNAMON PO Take 1 tablet by mouth daily.     Multiple Vitamin (MULTIVITAMIN) tablet Take 1 tablet by mouth daily.    OVER THE COUNTER MEDICATION Pt takes a lot of herbal supplements. Cant confirm the meds at this time on what they are.       Follow-up Information    Follow up with Staci Righter, MD. Schedule an appointment as soon as possible for a visit in 1 week. (follow up for cellulitis)    Contact information:   [address] [place] [address] [phone]          TOTAL DISCHARGE TIME: 35 mins  [facility]  [facility] Pager [phone]  10/05/2012, 1:42 PM

## 2012-10-05 NOTE — Progress Notes (Signed)
[facility] PROGRESS NOTE  Justin [name] ZOX:[id number] DOB: 1927-10-10 DOA: 09/25/2012  PCP: Dr. Brynda Rim??  Brief HPI: 79 year old male with no major medical problems presents with concerns of right leg pain and cellulitis. The patient has had a very protracted history. Apparently, the patient has had multiple courses antibiotics for "cellulitis" of the right lower extremity which have included doxycycline, clindamycin, Augmentin, cephalexin, and Levaquin. The patient has had right lower extremity erythema and swelling since the beginning of 2013. Please note that during these previous episodes, the patient has never had any fevers, chills, or actual pain in his right lower extremity. In early November 2013, the patient was given a course of Augmentin for cellulitis of his right lower extremity again. Even prior to this course of antibiotic, the patient has had edema, erythema of his right lower extremity as well as some patchy areas of erythema on his right upper extremity. After taking the Augmentin 13 days, the patient noted increasing scaling, erythema, and scaly patches on his right lower extremity and right upper extremity. The patient was given clindamycin and then prednisone. This did not seem to help much. His erythematous patches on his right arm and edema and erythema on his right leg continued to progress despite prednisone. The patient was given levofloxacin on 08/18/2012. He stated that after one dose of the Levaquin, his scaly erythematous patches on his right upper extremity spread to his torso, flank, left upper extremity. In addition, the erythema and scaling in his right lower extremity also worsened. Since that period of time, the patient has been self-medicating himself with topical Neosporin as well as Preparation H with Biotin which he obtains from [place]. He states that this has not helped much. He is been using these topicals mainly for the complaint of pruritus. On most  days, the patient experiences pruritus on his arms, torso, and bilateral thighs. Please note the patient is not having significant pain except that in his ankle. He denies any recent injury or trauma. Since the Augmentin and Levaquin, the patient has noted increasing clear drainage from his right lower extremity without odor or pus. On 07/23/2012, the patient also had a nonsurgical/superficial culture performed on his right lower extremity drainage. It grew group A streptococcus and pseudomonas aeruginosa. Please note that during all his recurrent episodes of his "cellulitis" but the patient has never had any systemic toxicity, fever, chills, rigors, nausea, vomiting, diarrhea. In fact, the patient states that outside of the frustration from his right lower extremity and other rashes, he feels "pretty good". Patient denies any exotic travels. He only has one dog without any other exotic pets.  Past medical history:  Past Medical History  Diagnosis Date  . Scarlet fever   . Erysipelas     Consultants: Seen by Gen Surg. ID following.  Procedures: None  Antibiotics: IV Vanc 1/11-->1/17 PO Keflex 1/16--> PO Doxycycline 1/16-->  Subjective: Patient feels the right leg is looking more swollen today. Continues to be concerned about left leg.   Objective: Vital Signs  Filed Vitals:   10/04/12 0623 10/04/12 1401 10/04/12 2210 10/05/12 0604  BP: 123/78 156/84 117/75 106/58  Pulse: 79 73 83 75  Temp: 97.7 F (36.5 C) 97.6 F (36.4 C) 98.3 F (36.8 C) 97.8 F (36.6 C)  TempSrc: Oral Oral Oral Oral  Resp: 18 20 18 18   Height:      Weight:      SpO2: 97% 100% 99% 96%    Intake/Output Summary (Last 24  hours) at 10/05/12 1231 Last data filed at 10/05/12 0900  Gross per 24 hour  Intake   1440 ml  Output      0 ml  Net   1440 ml   Filed Weights   09/25/12 1900  Weight: 73.8 kg (162 lb 11.2 oz)    Intake/Output from previous day: 01/20 0701 - 01/21 0700 In: 1320 [P.O.:1320] Out: -    General appearance: alert, cooperative, appears stated age and no distress Resp: clear to auscultation bilaterally Cardio: regular rate and rhythm, S1, S2 normal, no murmur, click, rub or gallop GI: soft, non-tender; bowel sounds normal; no masses,  no organomegaly Extremities: Right Leg: Stable from 1/20. Less moist. Left Leg: Swelling more so laterally. And tender to palpation. Pulses not well palpated. Skin: 1/21 exam: Dressing removed from right leg: Less Erythematous and less moist. Scaly lesions in foot.  Macular rash noted in both thighs improving. Redness and swelling seen in left leg as well same as yesterday. Neurologic: Alert and oriented x 3. No focal deficits.  Lab Results:  Basic Metabolic Panel:  Lab 10/04/12 1610 10/02/12 0529 09/29/12 0832  NA 140 -- 138  K 4.2 -- 3.7  CL 104 -- 104  CO2 29 -- 29  GLUCOSE 95 -- 93  BUN 20 -- 15  CREATININE 0.84 0.76 0.75  CALCIUM 8.9 -- 8.4  MG -- -- --  PHOS -- -- --   CBC:  Lab 10/04/12 0401 09/30/12 0444 09/29/12 0832  WBC 6.9 7.3 6.5  NEUTROABS -- -- --  HGB 12.2* 12.7* 12.3*  HCT 36.2* 38.1* 36.6*  MCV 98.6 98.4 98.1  PLT 230 216 193    Recent Results (from the past 240 hour(s))  CULTURE, BLOOD (ROUTINE X 2)     Status: Normal   Collection Time   09/25/12  7:45 PM      Component Value Range Status Comment   Specimen Description BLOOD LEFT FOREARM  3 ML IN [facility] BOTTLE   Final    Special Requests NONE   Final    Culture  Setup Time 09/25/2012 22:59   Final    Culture NO GROWTH 5 DAYS   Final    Report Status 10/01/2012 FINAL   Final   CULTURE, BLOOD (ROUTINE X 2)     Status: Normal   Collection Time   09/25/12  7:50 PM      Component Value Range Status Comment   Specimen Description BLOOD RIGHT FOREARM  1 ML IN AEROBIC ONLY   Final    Special Requests NONE   Final    Culture  Setup Time 09/25/2012 22:59   Final    Culture NO GROWTH 5 DAYS   Final    Report Status 10/01/2012 FINAL   Final        Studies/Results: [place] Extrem Low Left Comp  10/04/2012  *RADIOLOGY REPORT*  Clinical Data: 79 year old male in patient with cutaneous of focal left lower extremity swelling.  Unclear clinically if this is infection or hematoma.  ULTRASOUND LEFT LOWER EXTREMITY LIMITED  Technique:  Ultrasound examination of the region of interest in the left lower extremity was performed.  Comparison:  Lower extremity MRI 10/01/2012.  Findings: Diffuse subcutaneous edema identified from the left knee to the left ankle.  No focal fluid collection or soft tissue mass. No drainable fluid.  IMPRESSION: Diffuse subcutaneous edema along the left lower extremity from the knee to the ankle with no focal fluid collection or mass.  Original Report Authenticated By: Erskine Speed, M.D.     Medications:  Scheduled:    . cephALEXin  500 mg Oral Q6H  . doxycycline  100 mg Oral Q12H  . hydrocerin   Topical BID  . rivaroxaban  20 mg Oral Q supper  . rivaroxaban  15 mg Oral BID WC   Continuous:   WUJ:[id number], acetaminophen, diphenhydrAMINE, oxyCODONE  Assessment/Plan:  Principal Problem:  *Erysipelas Active Problems:  Cellulitis  Venous stasis dermatitis  Hematuria  DVT of lower extremity (deep venous thrombosis)    Cellulitis vs Erysipelas Vs other dermatological condition Right leg seems to be same as yesterday. Patient feels it looks more swollen. MRI does suggest diffuse cellulitis, no abscess. Didn't improve with IV vanc. Keflex and Doxy initiated 1/16. Appreciate ID input. Appears to have cellulitis of left leg as well. [place] did not show any fluid collection. I think this will take weeks to get better. Patient wants to talk to Dr. Luciana Axe before he is discharged.  Left lower extremity DVT  Continue Xarelto. Will have case manager assist with home meds.  Hematuria  No further episodes. No abnormality on renal US.  Continue to monitor. Flomax discontinued 1/19. See 1/19 note.  Code Status: full code   Family Communication: no family at bedside  Disposition Plan: Plan is for discharge today once cleared by ID.  DVT Prophylaxis On Xarelto   LOS: 10 days   [facility]  [facility] Pager [phone] 10/05/2012, 12:31 PM  If 8PM-8AM, please contact night-coverage at [email or web address], password [facility]

## 2012-10-06 MED ORDER — DOXYCYCLINE HYCLATE 100 MG PO TABS
100.0000 mg | ORAL_TABLET | Freq: Two times a day (BID) | ORAL | Status: DC
  Administered 2012-10-06 – 2012-10-07 (×2): 100 mg via ORAL
  Filled 2012-10-06 (×3): qty 1

## 2012-10-06 NOTE — Progress Notes (Signed)
Pt had refused dressing change to R LE stating that 2 doctors will be coming over to check R LE & will remove dressings anyway; wife aware of his refusal for dressing change. Case manager had called to notify  RN earlier that no response had been received re: pt's appeal for d/c. Pt was notified of it as well.

## 2012-10-06 NOTE — Progress Notes (Signed)
Patient ID: Justin [name], male   DOB: 1927-10-03, 79 y.o.   MRN: [id number]  [facility] PROGRESS NOTE  Justin [name] MWU:[id number] DOB: 1928-03-16 DOA: 09/25/2012 PCP: No primary provider on file.  Brief narrative: 79 year old male with no major medical problems presents with concerns of right leg pain and cellulitis. Apparently, the patient has had multiple courses antibiotics for "cellulitis" of the right lower extremity which have included doxycycline, clindamycin, Augmentin, cephalexin, and Levaquin. The patient has had right lower extremity erythema and swelling since the beginning of 2013. Please note that during these previous episodes, the patient has never had any fevers, chills, or actual pain in his right lower extremity. In early November 2013, the patient was given a course of Augmentin for cellulitis of his right lower extremity again. Even prior to this course of antibiotic, the patient has had edema, erythema of his right lower extremity as well as some patchy areas of erythema on his right upper extremity. After taking the Augmentin 13 days, the patient noted increasing scaling, erythema, and scaly patches on his right lower extremity and right upper extremity. The patient was given clindamycin and then prednisone. This did not seem to help much. His erythematous patches on his right arm and edema and erythema on his right leg continued to progress despite prednisone. The patient was given levofloxacin on 08/18/2012. He stated that after one dose of the Levaquin, his scaly erythematous patches on his right upper extremity spread to his torso, flank, left upper extremity. In addition, the erythema and scaling in his right lower extremity also worsened. Since that period of time, the patient has been self-medicating himself with topical Neosporin as well as Preparation H with Biotin which he obtains from [place]. He states that this has not helped much. He is been using these topicals  mainly for the complaint of pruritus. On most days, the patient experiences pruritus on his arms, torso, and bilateral thighs. Please note the patient is not having significant pain except that in his ankle. He denies any recent injury or trauma. Since the Augmentin and Levaquin, the patient has noted increasing clear drainage from his right lower extremity without odor or pus. On 07/23/2012, the patient also had a nonsurgical/superficial culture performed on his right lower extremity drainage. It grew group A streptococcus and pseudomonas aeruginosa. Please note that during all his recurrent episodes of his "cellulitis" the patient has never had any systemic toxicity, fever, chills, rigors, nausea, vomiting, diarrhea. In fact, the patient states that outside of the frustration from his right lower extremity and other rashes, he feels "pretty good". Patient denies any exotic travels. He only has one dog without any other exotic pets.    Principal Problem:  *Erysipelas - continue current ABX regimen  - will ask Dr. Elisabeth Pigeon to evaluate if wound is getting better as she was the one that initially saw the wound - supportive care Active Problems:  Cellulitis - treatment with ABX as noted above  Venous stasis dermatitis - chronic in nature  Hematuria - resolved at this time  DVT of lower extremity (deep venous thrombosis) - continue Xarelto  Consultants:  ID  Procedures/Studies:  No results found.  Antibiotics:  IV Vanc 1/11-->1/17   PO Keflex 1/16-->   PO Doxycycline 1/16-->  Code Status: Full Family Communication: Pt at bedside Disposition Plan: Home when medically stable  HPI/Subjective: No events overnight.   Objective: Filed Vitals:   10/05/12 0604 10/05/12 2148 10/06/12 0547 10/06/12 1400  BP:  106/58 102/67 134/80 138/67  Pulse: 75 81 87 71  Temp: 97.8 F (36.6 C) 98.6 F (37 C) 98.3 F (36.8 C) 98.4 F (36.9 C)  TempSrc: Oral Oral Oral Oral  Resp: 18 18 18 18     Height:      Weight:      SpO2: 96% 96% 97% 100%    Intake/Output Summary (Last 24 hours) at 10/06/12 2124 Last data filed at 10/06/12 2016  Gross per 24 hour  Intake   1200 ml  Output      0 ml  Net   1200 ml    Exam:   General:  Pt is alert, follows commands appropriately, not in acute distress  Cardiovascular: Regular rate and rhythm, S1/S2, no murmurs, no rubs, no gallops  Respiratory: Clear to auscultation bilaterally, no wheezing, no crackles, no rhonchi  Abdomen: Soft, non tender, non distended, bowel sounds present, no guarding  Extremities: Will ask Dr Elisabeth Pigeon to look at the wound for comparison purposes   Neuro: Grossly nonfocal  Data Reviewed: Basic Metabolic Panel:  Lab 10/04/12 1610 10/02/12 0529  NA 140 --  K 4.2 --  CL 104 --  CO2 29 --  GLUCOSE 95 --  BUN 20 --  CREATININE 0.84 0.76  CALCIUM 8.9 --  MG -- --  PHOS -- --   CBC:  Lab 10/04/12 0401 09/30/12 0444  WBC 6.9 7.3  NEUTROABS -- --  HGB 12.2* 12.7*  HCT 36.2* 38.1*  MCV 98.6 98.4  PLT 230 216   Scheduled Meds:   . cephALEXin  500 mg Oral Q6H  . hydrocerin   Topical BID  . rivaroxaban  20 mg Oral Q supper  . rivaroxaban  15 mg Oral BID WC   Continuous Infusions:    Debbora Presto, MD  [facility] Pager [phone]  If 7PM-7AM, please contact night-coverage [email or web address] Password [facility] 10/06/2012, 9:24 PM   LOS: 11 days

## 2012-10-07 MED ORDER — RIVAROXABAN 15 MG PO TABS: 15.0000 mg | ORAL_TABLET | Freq: Two times a day (BID) | ORAL | Status: DC

## 2012-10-07 MED ORDER — OXYCODONE HCL 5 MG PO TABS: 5.0000 mg | ORAL_TABLET | ORAL | Status: DC | PRN

## 2012-10-07 MED ORDER — RIVAROXABAN 20 MG PO TABS: 20.0000 mg | ORAL_TABLET | Freq: Every day | ORAL | Status: DC

## 2012-10-07 MED ORDER — CEPHALEXIN 500 MG PO CAPS: 500.0000 mg | ORAL_CAPSULE | Freq: Four times a day (QID) | ORAL | Status: DC

## 2012-10-07 MED ORDER — DOXYCYCLINE HYCLATE 100 MG PO TABS: 100.0000 mg | ORAL_TABLET | Freq: Two times a day (BID) | ORAL | Status: DC

## 2012-10-07 NOTE — Discharge Summary (Signed)
Physician Discharge Summary  Justin [name] ZOX:[id number] DOB: [birth year] DOA: 09/25/2012  PCP: No primary provider on file.  Admit date: 09/25/2012 Discharge date: 10/07/2012  Recommendations for Outpatient Follow-up:  1. Pt will need to follow up with PCP in 2-3 weeks post discharge 2. Please obtain BMP to evaluate electrolytes and kidney function 3. Please also check CBC to evaluate Hg and Hct levels 4. Please note that pt was discharged on oral ABX since he has refused PICC line placement and IV ABX 5. Pt given my contact information to call back with questions if needed 6. Would be glad to assist if needed  Discharge Diagnoses: right lower extremity cellulitis  Principal Problem:  *Erysipelas Active Problems:  Cellulitis  Venous stasis dermatitis  Hematuria  DVT of lower extremity (deep venous thrombosis)  Discharge Condition: Stable  Diet recommendation: Heart healthy diet discussed in details   Brief narrative:  79 year old male with no major medical problems presents with concerns of right leg pain and cellulitis. Apparently, the patient has had multiple courses antibiotics for "cellulitis" of the right lower extremity which have included doxycycline, clindamycin, Augmentin, cephalexin, and Levaquin. The patient has had right lower extremity erythema and swelling since the beginning of 2013. Please note that during these previous episodes, the patient has never had any fevers, chills, or actual pain in his right lower extremity. In early November 2013, the patient was given a course of Augmentin for cellulitis of his right lower extremity again. Even prior to this course of antibiotic, the patient has had edema, erythema of his right lower extremity as well as some patchy areas of erythema on his right upper extremity. After taking the Augmentin 13 days, the patient noted increasing scaling, erythema, and scaly patches on his right lower extremity and right upper extremity. The  patient was given clindamycin and then prednisone. This did not seem to help much. His erythematous patches on his right arm and edema and erythema on his right leg continued to progress despite prednisone. The patient was given levofloxacin on 08/18/2012. He stated that after one dose of the Levaquin, his scaly erythematous patches on his right upper extremity spread to his torso, flank, left upper extremity. In addition, the erythema and scaling in his right lower extremity also worsened. Since that period of time, the patient has been self-medicating himself with topical Neosporin as well as Preparation H with Biotin which he obtains from [place]. He states that this has not helped much. He is been using these topicals mainly for the complaint of pruritus. On most days, the patient experiences pruritus on his arms, torso, and bilateral thighs. Please note the patient is not having significant pain except that in his ankle. He denies any recent injury or trauma. Since the Augmentin and Levaquin, the patient has noted increasing clear drainage from his right lower extremity without odor or pus. On 07/23/2012, the patient also had a nonsurgical/superficial culture performed on his right lower extremity drainage. It grew group A streptococcus and pseudomonas aeruginosa. Please note that during all his recurrent episodes of his "cellulitis" the patient has never had any systemic toxicity, fever, chills, rigors, nausea, vomiting, diarrhea. In fact, the patient states that outside of the frustration from his right lower extremity and other rashes, he feels "pretty good". Patient denies any exotic travels. He only has one dog without any other exotic pets.   Principal Problem:  *Erysipelas  - in the right lower extremity improvement noted, but worsening erythema in the left  lower extremity - supportive care provided along with PO ABX but unclear why left LE looks worse Active Problems:  Cellulitis  - treatment  with ABX PO provided and as noted above improvement in right lower extremity noted but worse in left LE - discussed the case with Dr. Luciana Axe and recommendation is to place pt on IV ABX and place PICC line so that pt can go home on IV ABX - pt however refuses treatment with IV ABX at this time - discussed risks of reusing the treatment including worsening of the infection and likely return to hospital in near future - pt verbalized understanding  Venous stasis dermatitis  - perhaps in right lower extremity but on admission left lower extremity clear with no signs of chronic venous changes - difficult to appreciate on right side given diffuse cellulitis  Hematuria  - resolved at this time  DVT of lower extremity (deep venous thrombosis)  - continue Xarelto   Consultants:  ID Antibiotics:  IV Vanc 1/11-->1/17  PO Keflex 1/16-->  PO Doxycycline 1/16--> Procedures/Studies: Dg Ankle 2 Views Right 09/25/2012   Severe soft tissue swelling, appears maximal at the dorsum of the foot. No acute fracture or dislocation identified about the right ankle.       US Renal 09/26/2012   1.  Normal kidneys.  2.  No renal calculi or hydronephrosis.  3.  Enlarged prostate gland.     Mr Tibia Fibula Right W Wo Contrast 10/01/2012   Diffuse cellulitis of the right lower leg.  No myositis or osteomyelitis.  No abscesses.  Multiple enlarged veins.     [place] Extrem Low Left Comp 10/04/2012   Diffuse subcutaneous edema along the left lower extremity from the knee to the ankle with no focal fluid collection or mass.    Dg Foot 2 Views Right 09/25/2012  Severe soft tissue swelling.  No underlying acute osseous abnormality identified in the right foot.  Discharge Exam: Filed Vitals:   10/07/12 0557  BP: 111/74  Pulse: 79  Temp: 97.5 F (36.4 C)  Resp: 18   Filed Vitals:   10/06/12 0547 10/06/12 1400 10/06/12 2100 10/07/12 0557  BP: 134/80 138/67 133/70 111/74  Pulse: 87 71 77 79  Temp: 98.3 F (36.8  C) 98.4 F (36.9 C) 98.2 F (36.8 C) 97.5 F (36.4 C)  TempSrc: Oral Oral Oral Oral  Resp: 18 18 18 18   Height:      Weight:      SpO2: 97% 100% 100% 97%    General: Pt is alert, follows commands appropriately, not in acute distress Cardiovascular: Regular rate and rhythm, S1/S2 +, no murmurs, no rubs, no gallops Respiratory: Clear to auscultation bilaterally, no wheezing, no crackles, no rhonchi Abdominal: Soft, non tender, non distended, bowel sounds +, no guarding Extremities: left lower extremity, foot, ankle edema and tenderness to palpation, warmth to touch extending to mid calf area, right extremity erythema and edema improving with less scaling, still tender to palpation, extending 2 inched above the right knee Neuro: Grossly nonfocal  Discharge Instructions  Discharge Orders    Future Orders Please Complete By Expires   Diet general      Diet - low sodium heart healthy      Increase activity slowly      Discharge instructions      Comments:   Please follow up with Dr. Luciana Axe next week. Wound care as before. You will need to follow up with a primary care physician to determine  what may have caused the blood clot. You might need a hypercoagulable work up.   Increase activity slowly          Medication List     As of 10/07/2012 12:41 PM    TAKE these medications         cephALEXin 500 MG capsule   Commonly known as: KEFLEX   Take 1 capsule (500 mg total) by mouth every 6 (six) hours.      CINNAMON PO   Take 1 tablet by mouth daily.      doxycycline 100 MG tablet   Commonly known as: VIBRA-TABS   Take 1 tablet (100 mg total) by mouth every 12 (twelve) hours.      multivitamin tablet   Take 1 tablet by mouth daily.      OVER THE COUNTER MEDICATION   Pt takes a lot of herbal supplements. Cant confirm the meds at this time on what they are.      oxyCODONE 5 MG immediate release tablet   Commonly known as: Oxy IR/ROXICODONE   Take 1 tablet (5 mg total) by  mouth every 4 (four) hours as needed for pain.      Rivaroxaban 15 MG Tabs tablet   Commonly known as: XARELTO   Take 1 tablet (15 mg total) by mouth 2 (two) times daily with a meal. Stop on 10/20/12      Rivaroxaban 20 MG Tabs   Commonly known as: XARELTO   Take 1 tablet (20 mg total) by mouth daily with supper. Starting 10/21/12           Follow-up Information    Follow up with Staci Righter, MD. Schedule an appointment as soon as possible for a visit in 1 week. (follow up for cellulitis)    Contact information:   [address] [place] [address] [phone]           The results of significant diagnostics from this hospitalization (including imaging, microbiology, ancillary and laboratory) are listed below for reference.     Microbiology: No results found for this or any previous visit (from the past 240 hour(s)).   Labs: Basic Metabolic Panel:  Lab 10/04/12 9147 10/02/12 0529  NA 140 --  K 4.2 --  CL 104 --  CO2 29 --  GLUCOSE 95 --  BUN 20 --  CREATININE 0.84 0.76  CALCIUM 8.9 --  MG -- --  PHOS -- --   CBC:  Lab 10/04/12 0401  WBC 6.9  NEUTROABS --  HGB 12.2*  HCT 36.2*  MCV 98.6  PLT 230    SIGNED: Time coordinating discharge: Over 30 minutes  Debbora Presto, MD  [facility] 10/07/2012, 12:41 PM Pager [phone]  If 7PM-7AM, please contact night-coverage [email or web address] Password [email or web address]

## 2012-10-07 NOTE — Progress Notes (Signed)
Assessment unchanged. MD's in to see pt recently. Discharge instructions given via teach back. Pt aware of medications to resume and new scripts provided by MD given. Encouraged pt to follow-up with primary doctor as directed by hospitalist. Wife and nurse accompanied pt to front entrance. Pt discharged via wheelchair.

## 2012-10-30 ENCOUNTER — Other Ambulatory Visit: Payer: Self-pay

## 2013-02-14 ENCOUNTER — Ambulatory Visit: Payer: Medicare Other | Admitting: Internal Medicine

## 2013-04-20 ENCOUNTER — Other Ambulatory Visit: Payer: Self-pay

## 2013-07-21 ENCOUNTER — Other Ambulatory Visit: Payer: Self-pay

## 2014-06-30 ENCOUNTER — Other Ambulatory Visit: Payer: Self-pay

## 2015-03-12 ENCOUNTER — Other Ambulatory Visit: Payer: Self-pay

## 2018-05-28 ENCOUNTER — Encounter (HOSPITAL_COMMUNITY): Payer: Self-pay

## 2018-05-28 ENCOUNTER — Emergency Department (HOSPITAL_COMMUNITY): Payer: Medicare Other

## 2018-05-28 ENCOUNTER — Inpatient Hospital Stay (HOSPITAL_COMMUNITY)
Admission: EM | Admit: 2018-05-28 | Discharge: 2018-06-02 | DRG: 871 | Disposition: A | Discharge status: Home / Self Care | Payer: Medicare Other | Attending: Internal Medicine | Admitting: Internal Medicine

## 2018-05-28 ENCOUNTER — Other Ambulatory Visit: Payer: Self-pay

## 2018-05-28 DIAGNOSIS — Z9114 Patient's other noncompliance with medication regimen: Secondary | ICD-10-CM

## 2018-05-28 DIAGNOSIS — N401 Enlarged prostate with lower urinary tract symptoms: Secondary | ICD-10-CM | POA: Diagnosis present

## 2018-05-28 DIAGNOSIS — L03115 Cellulitis of right lower limb: Secondary | ICD-10-CM | POA: Diagnosis present

## 2018-05-28 DIAGNOSIS — Z8249 Family history of ischemic heart disease and other diseases of the circulatory system: Secondary | ICD-10-CM | POA: Diagnosis not present

## 2018-05-28 DIAGNOSIS — A419 Sepsis, unspecified organism: Secondary | ICD-10-CM | POA: Diagnosis not present

## 2018-05-28 DIAGNOSIS — L97819 Non-pressure chronic ulcer of other part of right lower leg with unspecified severity: Secondary | ICD-10-CM | POA: Diagnosis present

## 2018-05-28 DIAGNOSIS — I83028 Varicose veins of left lower extremity with ulcer other part of lower leg: Secondary | ICD-10-CM | POA: Diagnosis present

## 2018-05-28 DIAGNOSIS — L03116 Cellulitis of left lower limb: Secondary | ICD-10-CM | POA: Diagnosis present

## 2018-05-28 DIAGNOSIS — I83009 Varicose veins of unspecified lower extremity with ulcer of unspecified site: Secondary | ICD-10-CM | POA: Diagnosis present

## 2018-05-28 DIAGNOSIS — E860 Dehydration: Secondary | ICD-10-CM | POA: Diagnosis present

## 2018-05-28 DIAGNOSIS — R338 Other retention of urine: Secondary | ICD-10-CM | POA: Diagnosis present

## 2018-05-28 DIAGNOSIS — G9341 Metabolic encephalopathy: Secondary | ICD-10-CM | POA: Diagnosis not present

## 2018-05-28 DIAGNOSIS — L97829 Non-pressure chronic ulcer of other part of left lower leg with unspecified severity: Secondary | ICD-10-CM | POA: Diagnosis present

## 2018-05-28 DIAGNOSIS — Z87891 Personal history of nicotine dependence: Secondary | ICD-10-CM

## 2018-05-28 DIAGNOSIS — I872 Venous insufficiency (chronic) (peripheral): Secondary | ICD-10-CM

## 2018-05-28 DIAGNOSIS — R296 Repeated falls: Secondary | ICD-10-CM | POA: Diagnosis present

## 2018-05-28 DIAGNOSIS — L03119 Cellulitis of unspecified part of limb: Secondary | ICD-10-CM | POA: Diagnosis present

## 2018-05-28 DIAGNOSIS — I83018 Varicose veins of right lower extremity with ulcer other part of lower leg: Secondary | ICD-10-CM | POA: Diagnosis present

## 2018-05-28 DIAGNOSIS — Z6823 Body mass index (BMI) 23.0-23.9, adult: Secondary | ICD-10-CM

## 2018-05-28 DIAGNOSIS — M6282 Rhabdomyolysis: Secondary | ICD-10-CM | POA: Diagnosis present

## 2018-05-28 DIAGNOSIS — Z66 Do not resuscitate: Secondary | ICD-10-CM | POA: Diagnosis present

## 2018-05-28 DIAGNOSIS — Z86718 Personal history of other venous thrombosis and embolism: Secondary | ICD-10-CM | POA: Diagnosis not present

## 2018-05-28 DIAGNOSIS — L97909 Non-pressure chronic ulcer of unspecified part of unspecified lower leg with unspecified severity: Secondary | ICD-10-CM | POA: Diagnosis present

## 2018-05-28 DIAGNOSIS — R531 Weakness: Secondary | ICD-10-CM | POA: Diagnosis not present

## 2018-05-28 DIAGNOSIS — R627 Adult failure to thrive: Secondary | ICD-10-CM | POA: Diagnosis present

## 2018-05-28 HISTORY — DX: Non-pressure chronic ulcer of unspecified part of right lower leg with unspecified severity: L97.919

## 2018-05-28 HISTORY — DX: Non-pressure chronic ulcer of unspecified part of right lower leg with unspecified severity: I83.019

## 2018-05-28 HISTORY — DX: Varicose veins of left lower extremity with ulcer of unspecified site: I83.029

## 2018-05-28 HISTORY — DX: Non-pressure chronic ulcer of unspecified part of left lower leg with unspecified severity: L97.929

## 2018-05-28 LAB — URINALYSIS, ROUTINE W REFLEX MICROSCOPIC
Bilirubin Urine: NEGATIVE
GLUCOSE, UA: NEGATIVE mg/dL
Hgb urine dipstick: NEGATIVE
KETONES UR: 80 mg/dL — AB
Leukocytes, UA: NEGATIVE
NITRITE: NEGATIVE
PH: 5 (ref 5.0–8.0)
Protein, ur: 30 mg/dL — AB
Specific Gravity, Urine: 1.024 (ref 1.005–1.030)

## 2018-05-28 LAB — CBC WITH DIFFERENTIAL/PLATELET
BASOS PCT: 0 %
Basophils Absolute: 0 10*3/uL (ref 0.0–0.1)
EOS PCT: 8 %
Eosinophils Absolute: 1.3 10*3/uL — ABNORMAL HIGH (ref 0.0–0.7)
HEMATOCRIT: 38.3 % — AB (ref 39.0–52.0)
Hemoglobin: 13.4 g/dL (ref 13.0–17.0)
LYMPHS ABS: 1.3 10*3/uL (ref 0.7–4.0)
Lymphocytes Relative: 8 %
MCH: 31.9 pg (ref 26.0–34.0)
MCHC: 35 g/dL (ref 30.0–36.0)
MCV: 91.2 fL (ref 78.0–100.0)
MONO ABS: 0.7 10*3/uL (ref 0.1–1.0)
MONOS PCT: 4 %
NEUTROS ABS: 13.1 10*3/uL — AB (ref 1.7–7.7)
Neutrophils Relative %: 80 %
PLATELETS: 421 10*3/uL — AB (ref 150–400)
RBC: 4.2 MIL/uL — AB (ref 4.22–5.81)
RDW: 13.8 % (ref 11.5–15.5)
WBC: 16.4 10*3/uL — AB (ref 4.0–10.5)

## 2018-05-28 LAB — COMPREHENSIVE METABOLIC PANEL
ALT: 34 U/L (ref 0–44)
AST: 51 U/L — AB (ref 15–41)
Albumin: 2.6 g/dL — ABNORMAL LOW (ref 3.5–5.0)
Alkaline Phosphatase: 69 U/L (ref 38–126)
Anion gap: 13 (ref 5–15)
BILIRUBIN TOTAL: 0.9 mg/dL (ref 0.3–1.2)
BUN: 35 mg/dL — AB (ref 8–23)
CHLORIDE: 101 mmol/L (ref 98–111)
CO2: 23 mmol/L (ref 22–32)
Calcium: 8.6 mg/dL — ABNORMAL LOW (ref 8.9–10.3)
Creatinine, Ser: 0.89 mg/dL (ref 0.61–1.24)
Glucose, Bld: 103 mg/dL — ABNORMAL HIGH (ref 70–99)
POTASSIUM: 3.9 mmol/L (ref 3.5–5.1)
Sodium: 137 mmol/L (ref 135–145)
Total Protein: 5.7 g/dL — ABNORMAL LOW (ref 6.5–8.1)

## 2018-05-28 LAB — CBC
HCT: 37.5 % — ABNORMAL LOW (ref 39.0–52.0)
Hemoglobin: 12.9 g/dL — ABNORMAL LOW (ref 13.0–17.0)
MCH: 31.6 pg (ref 26.0–34.0)
MCHC: 34.4 g/dL (ref 30.0–36.0)
MCV: 91.9 fL (ref 78.0–100.0)
PLATELETS: 388 10*3/uL (ref 150–400)
RBC: 4.08 MIL/uL — AB (ref 4.22–5.81)
RDW: 13.8 % (ref 11.5–15.5)
WBC: 15.8 10*3/uL — ABNORMAL HIGH (ref 4.0–10.5)

## 2018-05-28 LAB — GLUCOSE, CAPILLARY: GLUCOSE-CAPILLARY: 98 mg/dL (ref 70–99)

## 2018-05-28 LAB — CK: Total CK: 980 U/L — ABNORMAL HIGH (ref 49–397)

## 2018-05-28 LAB — CREATININE, SERUM
Creatinine, Ser: 0.97 mg/dL (ref 0.61–1.24)
GFR calc Af Amer: >60 mL/min (ref >60)
GFR calc non Af Amer: >60 mL/min (ref >60)

## 2018-05-28 LAB — LACTIC ACID, PLASMA: Lactic Acid, Venous: 1 mmol/L (ref 0.5–1.9)

## 2018-05-28 MED ORDER — TRAMADOL HCL 50 MG PO TABS
100.0000 mg | ORAL_TABLET | Freq: Four times a day (QID) | ORAL | Status: DC | PRN
  Administered 2018-05-28: 100 mg via ORAL
  Filled 2018-05-28: qty 2

## 2018-05-28 MED ORDER — ENOXAPARIN SODIUM 40 MG/0.4ML ~~LOC~~ SOLN
40.0000 mg | SUBCUTANEOUS | Status: DC
  Administered 2018-05-29 – 2018-06-02 (×5): 40 mg via SUBCUTANEOUS
  Filled 2018-05-28 (×6): qty 0.4

## 2018-05-28 MED ORDER — SODIUM CHLORIDE 0.9 % IV SOLN
INTRAVENOUS | Status: DC
  Administered 2018-05-28: 07:00:00 via INTRAVENOUS

## 2018-05-28 MED ORDER — ACETAMINOPHEN 325 MG PO TABS
650.0000 mg | ORAL_TABLET | Freq: Once | ORAL | Status: AC
  Administered 2018-05-28: 650 mg via ORAL
  Filled 2018-05-28: qty 2

## 2018-05-28 MED ORDER — SODIUM CHLORIDE 0.9% FLUSH: 3.0000 mL | Freq: Two times a day (BID) | INTRAVENOUS | Status: DC

## 2018-05-28 MED ORDER — CEFAZOLIN SODIUM-DEXTROSE 1-4 GM/50ML-% IV SOLN
1.0000 g | Freq: Three times a day (TID) | INTRAVENOUS | Status: DC
  Administered 2018-05-28 – 2018-06-02 (×14): 1 g via INTRAVENOUS
  Filled 2018-05-28 (×17): qty 50

## 2018-05-28 MED ORDER — IBUPROFEN 200 MG PO TABS
400.0000 mg | ORAL_TABLET | Freq: Four times a day (QID) | ORAL | Status: DC
  Administered 2018-05-28 – 2018-05-29 (×3): 400 mg via ORAL
  Filled 2018-05-28 (×3): qty 2

## 2018-05-28 MED ORDER — OXYCODONE-ACETAMINOPHEN 5-325 MG PO TABS: 1.0000 | ORAL_TABLET | Freq: Four times a day (QID) | ORAL | Status: DC | PRN

## 2018-05-28 MED ORDER — SODIUM CHLORIDE 0.9 % IV BOLUS
500.0000 mL | Freq: Once | INTRAVENOUS | Status: AC
Start: 2018-05-28 — End: 2018-05-28
  Administered 2018-05-28: 500 mL via INTRAVENOUS

## 2018-05-28 MED ORDER — OLANZAPINE 5 MG PO TBDP
5.0000 mg | ORAL_TABLET | Freq: Once | ORAL | Status: AC
  Administered 2018-05-28: 5 mg via ORAL
  Filled 2018-05-28: qty 1

## 2018-05-28 MED ORDER — OLANZAPINE 5 MG PO TBDP: 5.0000 mg | ORAL_TABLET | Freq: Every day | ORAL | Status: DC

## 2018-05-28 MED ORDER — HALOPERIDOL LACTATE 5 MG/ML IJ SOLN
1.0000 mg | Freq: Four times a day (QID) | INTRAMUSCULAR | Status: DC | PRN
  Administered 2018-05-28 – 2018-05-30 (×2): 1 mg via INTRAVENOUS
  Filled 2018-05-28 (×2): qty 1

## 2018-05-28 MED ORDER — SODIUM CHLORIDE 0.9 % IV BOLUS
500.0000 mL | Freq: Once | INTRAVENOUS | Status: AC
  Administered 2018-05-28: 500 mL via INTRAVENOUS

## 2018-05-28 MED ORDER — SODIUM CHLORIDE 0.9 % IV SOLN
INTRAVENOUS | Status: DC
Start: 2018-05-28 — End: 2018-06-02
  Administered 2018-05-28 – 2018-06-01 (×7): via INTRAVENOUS

## 2018-05-28 MED ORDER — CEFAZOLIN SODIUM-DEXTROSE 1-4 GM/50ML-% IV SOLN
1.0000 g | Freq: Once | INTRAVENOUS | Status: AC
  Administered 2018-05-28: 1 g via INTRAVENOUS
  Filled 2018-05-28: qty 50

## 2018-05-28 NOTE — Progress Notes (Signed)
Pts wife called in for and update on her husbands status- she would like for the doctor to call her after making rounds on him, she is unable to return to the hospital and sit at bedside.

## 2018-05-28 NOTE — Progress Notes (Signed)
Awaiting pts arrival once ready for transport.

## 2018-05-28 NOTE — Consult Note (Signed)
WOC Nurse wound consult note Reason for Consult:LE wounds Reviewed chart and images. Requested ABI to evaluate arterial perfusion prior to any treatment for venous stasis  Wound type: suspected venous stasis, long-standing history. However patient does not have edema and with the punched out appearance of the ulcers would want to verify arterial perfusion. Patient has weak pulses distally. Venous stasis skin changes Pressure Injury POA:NA Measurement: scattered ulcerations all pretibial and lateral of the bilateral LE, most average 2cm x 2cm x 0.1cm, some linear openings along the bony surface of the RLE. Largest wounds are noted on the LLE.  Wound bed: mostly clean, moist, pink, non healing, one noted on the LLE distal pretibial region with 100% yellow fibrinous material  Drainage (amount, consistency, odor) minimal at the time of my assessment, however the legs have musty odor consistent with leg ulcerations that are infected  Periwound: intact  Dressing procedure/placement/frequency: For now I will only add non adherent with antibacterial properties. Suggested need for compression to patient and he at this point seems to decline. Stating "I don't want my legs wrapped tightly".  Concerned for the use of compression also until we have a clear picture of his arterial flow.  He may or may not allow for the ABIs to be completed.  Patient has a lot of suggestions and ways he treats his legs at home.   If ABI's are normal would suggest to leave xeroform in place and top with [employer] boots which orthopedic techs apply in the inpatient setting. Would recommend follow up with wound care center (not sure patient will be compliant) with his limitations for mobility as well..  Discussed POC with patient and bedside nurse.  Re consult if needed, will not follow at this time. Thanks  Bruno Leach [employer] MSN, RN,CWOCN, CNS, CWON-AP [phone])

## 2018-05-28 NOTE — Progress Notes (Signed)
Attempted in and out catheter with no success.  Patient in need of coude catheter.  Nurse to pass on to doctor.

## 2018-05-28 NOTE — Progress Notes (Signed)
Central Tele alerted this RN that pt was having beats of trigeminy, upon coming to the pts room to assess pt, NT was getting vitals and pt appeared pale and very lethargic. MD was notified and responded; waiting at bedside for MD now. Rapid Response RN called and notified. Upon asking pt where he is and the year he stated he could not remember. Pt also could not remember his name. Will continue to monitor and carry out orders.

## 2018-05-28 NOTE — ED Triage Notes (Signed)
Patient arrives by [facility]AR from his home. Wife called 911. Wife confined downstairs-patient was ambulatory and he possibly fell on 05/26/2018. Patient reaching for something was weak and fell. Patient unable to give reliable information. Patient has infection in his legs-unable to get any other information from patient.

## 2018-05-28 NOTE — Progress Notes (Signed)
[facility] Pt tolerated well.

## 2018-05-28 NOTE — H&P (Addendum)
HPI  Justin [name] ZOX:[id number] DOB: 1928-07-10 DOA: 05/28/2018  PCP: Patient, No Pcp Per   Chief Complaint: Sick  HPI:  84 year old male generalized weakness bringing him to the emergency room Known history of probable cellulitis, venous stasis dermatitis, prior lower extremity DVT on Xarelto in the past  Note that the patient was admitted in 2014 with possible infection and was kept on vancomycin and switch to doxycycline and Keflex and MRI done at that time showed only cellulitis it was felt that he could have erysipelas at the time and it was recommended that he follow-up with ID----tells me that after his last hospital stay in 2014 he obtained silver socks and was treating his legs with colloid of silver [patient is an Art gallery managerengineer of some type]  He also had a DVT at that time and agreed hesitatingly to Xarelto as he was more interested in "alternative therapies" -Note that he takes multiple over-the-counter medications  Patient had a fall and "thought I was going to die" he had to skate across the floor and could not get up because of weakness in his legs and inability to eat over the past couple of days-states that the wound on his legs started after scratching his leg with the car door about 6 months ago and is festered on since then   ED Course: Patient was started on IV saline at 75 cc/h as well as Ancef  Review of Systems:   + Fever, lower extremity wounds, blurred vision negative, double vision negative, does have some strabismus,  No dark or tarry stool, no diarrhea,  Past Medical History:  Diagnosis Date  . Erysipelas   . Scarlet fever   . Venous stasis ulcers of both lower extremities (HCC)     Past Surgical History:  Procedure Laterality Date  . TONSILLECTOMY       reports that he quit smoking about 55 years ago. He has never used smokeless tobacco. He reports that he does not drink alcohol or use drugs. Mobility: Independent but has been less so in the  recent past  No Known Allergies  Family History  Problem Relation Age of Onset  . Heart disease Mother   . Cancer Mother        leukemia  . Diabetes Neg Hx      Prior to Admission medications   Medication Sig Start Date End Date Taking? Authorizing Provider  cephALEXin (KEFLEX) 500 MG capsule Take 1 capsule (500 mg total) by mouth every 6 (six) hours. Patient not taking: Reported on 05/28/2018 10/07/12   Dorothea OgleMyers, Iskra M, MD  doxycycline (VIBRA-TABS) 100 MG tablet Take 1 tablet (100 mg total) by mouth every 12 (twelve) hours. Patient not taking: Reported on 05/28/2018 10/07/12   Dorothea OgleMyers, Iskra M, MD  oxyCODONE (OXY IR/ROXICODONE) 5 MG immediate release tablet Take 1 tablet (5 mg total) by mouth every 4 (four) hours as needed for pain. Patient not taking: Reported on 05/28/2018 10/07/12   Dorothea OgleMyers, Iskra M, MD  Rivaroxaban (XARELTO) 15 MG TABS tablet Take 1 tablet (15 mg total) by mouth 2 (two) times daily with a meal. Stop on 10/20/12 Patient not taking: Reported on 05/28/2018 10/07/12   Dorothea OgleMyers, Iskra M, MD  Rivaroxaban (XARELTO) 20 MG TABS Take 1 tablet (20 mg total) by mouth daily with supper. Starting 10/21/12 Patient not taking: Reported on 05/28/2018 10/07/12   Dorothea OgleMyers, Iskra M, MD    Physical Exam:  Vitals:   05/28/18 0730 05/28/18 0800  BP: 132/70 Marland Kitchen(!)  140/104  Pulse: 94 99  Resp: (!) 21 16  Temp:    SpO2: 95% 96%     Strabismus bilaterally no icterus no pallor frail appearing to some degree  EOMI NCAT without distress  Chest clinically clear no added sound no rales no rhonchi  Abdomen soft nontender nondistended no rebound no guarding  Neurologically seems intact and coherent  Lower extremities have weeping areas on both legs with foul odor  Musculoskeletal able to bend knees  Neurologically grossly intact moves all 4 limbs equally formal testing deferred  Skin as above  -He has palpable pulses in both lower extremities-I examined him upstairs when I went back to the bedside  to see him at around 2 PM  I have personally reviewed following labs and imaging studies  Labs:   BUN/creatinine 35/0.9 AST 51 albumin 2.6  WBC 16.4 platelet 421   UA-no nitrites or leukocytes--- 80 ketones    Imaging studies:   CT head showed no acute intracranial abnormality with moderate to advanced chronic small vessel ischemia  Medical tests:   EKG independently reviewed: non    Test discussed with performing physician:  n   Decision to obtain old records:   n   Review and summation of old records:   n   Active Problems:   Venous stasis ulcer (HCC)   Assessment/Plan Stasis ulcers with sepsis Prior concerns for erysipelas in the past and hospitalization 2014 We will start on IV saline 100 cc/h, continue Ancef that has been started in the ED, will need pain control so added tramadol As this did not work and I received a call later about this-we added percocet  Nontraumatic rhabdomyolysis CK 900-continue saline-repeat labs a.m. and expect will resolve quickly  Fall- unclear etiology-keep on telemetry for now-expect this is probably secondary to mild azotemia and inability to eat as he gives a history of not being able to do the same over the past couple of days and cannot sit at dining table because of the pain in his legs Get sitting and lying orthostatics--suspect volume depletion is etiology  Volume depletion  Hydrate with saline 100 cc/h-repeat labs a.m.-etiology secondary to poor p.o. intake recently per history     Severity of Illness: The appropriate patient status for this patient is INPATIENT. Inpatient status is judged to be reasonable and necessary in order to provide the required intensity of service to ensure the patient's safety. The patient's presenting symptoms, physical exam findings, and initial radiographic and laboratory data in the context of their chronic comorbidities is felt to place them at high risk for further clinical  deterioration. Furthermore, it is not anticipated that the patient will be medically stable for discharge from the hospital within 2 midnights of admission. The following factors support the patient status of inpatient.   " The patient's presenting symptoms include cellulitis fever and fall. " The worrisome physical exam findings include infectious etiology. " The initial radiographic and laboratory data are worrisome because of infection. " The chronic co-morbidities include none.   * I certify that at the point of admission it is my clinical judgment that the patient will require inpatient hospital care spanning beyond 2 midnights from the point of admission due to high intensity of service, high risk for further deterioration and high frequency of surveillance required.*   Lovenox, inpatient, needs therapy assessment has had recent falls and I am not sure if he can go home, no family  Time spent: 50 minutes  Harlin Mazzoni,  MD  [facility] Direct contact: [phone] --Via amion app OR  --[email or web address]; password [email or web address]  7PM-7AM contact night coverage as above  05/28/2018, 8:46 AM

## 2018-05-28 NOTE — ED Triage Notes (Signed)
Patient states he has been on the floor since yesterday because he could not get up due to weakness. Patient has a skin tear to his right elbow. Patient has ope oozing and weeping foul smelling lesions to both lower extremities-Dr. Molpus removed ted hose and found wounds.

## 2018-05-28 NOTE — Progress Notes (Signed)
PT Cancellation Note  Patient Details Name: Justin [name] MRN: [id number] DOB: Aug 01, 1928   Cancelled Treatment:    Reason Eval/Treat Not Completed: Medical issues which prohibited therapy(rapid response called to room)   Maida SaleLEMYRE,KATHrine E 05/28/2018, 2:17 PM Zenovia JarredKati Alwyn Cordner, PT, DPT Acute Rehabilitation Services Office: [phone] Pager: [phone]

## 2018-05-28 NOTE — Care Management (Signed)
05/28/18/from chart Assessment/Plan Stasis ulcers with sepsis Prior concerns for erysipelas in the past and hospitalization 2014 We will start on IV saline 100 cc/h, continue Ancef that has been started in the ED, will need pain control so added tramadol As this did not work and I received a call later about this-we added percocet  Nontraumatic rhabdomyolysis CK 900-continue saline-repeat labs a.m. and expect will resolve quickly  Fall- unclear etiology-keep on telemetry for now-expect this is probably secondary to mild azotemia and inability to eat as he gives a history of not being able to do the same over the past couple of days and cannot sit at dining table because of the pain in his legs Get sitting and lying orthostatics--suspect volume depletion is etiology  Volume depletion         Hydrate with saline 100 cc/h-repeat labs a.m.-etiology secondary to poor p.o. intake recently per history     Severity of Illness: The appropriate patient status for this patient is INPATIENT. Inpatient status is judged to be reasonable and necessary in order to provide the required intensity of service to ensure the patient's safety. The patient's presenting symptoms, physical exam findings, and initial radiographic and laboratory data in the context of their chronic comorbidities is felt to place them at high risk for further clinical deterioration. Furthermore, it is not anticipated that the patient will be medically stable for discharge from the hospital within 2 midnights of admission. The following factors support the patient status of inpatient.   "           The patient's presenting symptoms include cellulitis fever and fall. "           The worrisome physical exam findings include infectious etiology. "           The initial radiographic and laboratory data are worrisome because of infection. "           The chronic co-morbidities include none.  Discharge Criteria Return to top of  Systemic or Infectious Condition GRG - GRG [Expand All / Collapse All]  Continued inpatient stay is needed until 1 or more of the following are present: ? Acceptable patient status for next level of care is achieved. ? ALL of the following are present:   Hemodynamic stability=yes    Respiratory status acceptable=rate=24    Neurologic status acceptable alert    Temperature status acceptable =98.2    No infection, or status acceptable no=wound infection/present /wbc=15.8 ck=980   Special infection cellulituis  Activity level acceptable=bed rest    Intake acceptable yes

## 2018-05-28 NOTE — Progress Notes (Signed)
Pt having urinary retention.  Foley cath attempt x3 by [facility] staff earlier in the shift.  Attempted insertion of 20F Coude cath but was very painful for patient.  Also could not insert fully and no urine obtained.  Removed coude and pt had some bright red bleeding with small clots.  Night RN to notify MD for Urology consultation. Nino Parsleyark, Bernice Mullin B

## 2018-05-28 NOTE — Progress Notes (Signed)
Bladder scan shows urine in the bladder

## 2018-05-28 NOTE — ED Provider Notes (Signed)
[facility]-EMERGENCY DEPT Provider Note: Lowella DellJ. Lane Sharelle Burditt, MD, FACEP  CSN: [id number] MRN: [id number] ARRIVAL: 05/28/18 at 0211 ROOM: WA13/WA13   CHIEF COMPLAINT  Weakness   HISTORY OF PRESENT ILLNESS  05/28/18 2:30 AM Justin [name] is a 84 y.o. male who has had the gradual onset of weakness over the past week.  The weakness is generalized.  It became so severe that he was unable to get off the floor since yesterday afternoon about 3 PM.  He denies injury.  He denies pain.  He denies chest pain, shortness of breath, nausea, vomiting or diarrhea.  He has chronic wounds of the lower legs which are bandaged; the bandages appear, and smell, like they have been in place for days if not weeks.  He also complains of visual changes over the past week which he describes as loss of peripheral vision and seeing things, specifically he thought the EMTs who brought him to the hospital was transparent.  He denied significant medical problems to EMS but they note that his house was cluttered with medical supplies.   Past Medical History:  Diagnosis Date  . Erysipelas   . Scarlet fever   . Venous stasis ulcers of both lower extremities (HCC)     Past Surgical History:  Procedure Laterality Date  . TONSILLECTOMY      Family History  Problem Relation Age of Onset  . Heart disease Mother   . Cancer Mother        leukemia  . Diabetes Neg Hx     Social History   Tobacco Use  . Smoking status: Former Smoker    Last attempt to quit: 09/15/1962    Years since quitting: 55.7  . Smokeless tobacco: Never Used  Substance Use Topics  . Alcohol use: No  . Drug use: No    Prior to Admission medications   Medication Sig Start Date End Date Taking? Authorizing Provider  cephALEXin (KEFLEX) 500 MG capsule Take 1 capsule (500 mg total) by mouth every 6 (six) hours. Patient not taking: Reported on 05/28/2018 10/07/12   Dorothea OgleMyers, Iskra M, MD  doxycycline (VIBRA-TABS) 100 MG tablet Take 1 tablet (100 mg total) by  mouth every 12 (twelve) hours. Patient not taking: Reported on 05/28/2018 10/07/12   Dorothea OgleMyers, Iskra M, MD  oxyCODONE (OXY IR/ROXICODONE) 5 MG immediate release tablet Take 1 tablet (5 mg total) by mouth every 4 (four) hours as needed for pain. Patient not taking: Reported on 05/28/2018 10/07/12   Dorothea OgleMyers, Iskra M, MD  Rivaroxaban (XARELTO) 15 MG TABS tablet Take 1 tablet (15 mg total) by mouth 2 (two) times daily with a meal. Stop on 10/20/12 Patient not taking: Reported on 05/28/2018 10/07/12   Dorothea OgleMyers, Iskra M, MD  Rivaroxaban (XARELTO) 20 MG TABS Take 1 tablet (20 mg total) by mouth daily with supper. Starting 10/21/12 Patient not taking: Reported on 05/28/2018 10/07/12   Dorothea OgleMyers, Iskra M, MD    Allergies Patient has no known allergies.   REVIEW OF SYSTEMS  Negative except as noted here or in the History of Present Illness.   PHYSICAL EXAMINATION  Initial Vital Signs Blood pressure (!) 156/88, pulse (!) 102, temperature 98.2 F (36.8 C), temperature source Oral, resp. rate (!) 24, SpO2 99 %.  Examination General: Well-developed, well-nourished male in no acute distress; appearance consistent with age of record HENT: normocephalic; atraumatic Eyes: pupils equal, round and reactive to light; extraocular muscles intact Neck: supple Heart: regular rate and rhythm Lungs: clear to auscultation bilaterally  Abdomen: soft; nondistended; nontender; no masses or hepatosplenomegaly; bowel sounds present Extremities: Bilateral hallux valgus; arthritic changes of hands; pulses normal Neurologic: Awake, alert and oriented; motor function intact in all extremities and symmetric; no facial droop Skin: Warm and dry; chronic appearing severe, foul-smelling, stasis ulcerations of lower legs bilaterally:    Psychiatric: Normal mood and affect; rambling, circumstantial speech   RESULTS    Summary of this visit's results, reviewed by myself:   EKG Interpretation  Date/Time:    Ventricular Rate:    PR  Interval:    QRS Duration:   QT Interval:    QTC Calculation:   R Axis:     Text Interpretation:        Laboratory Studies: Results for orders placed or performed during the hospital encounter of 05/28/18 (from the past 24 hour(s))  CBC with Differential/Platelet     Status: Abnormal   Collection Time: 05/28/18  3:01 AM  Result Value Ref Range   WBC 16.4 (H) 4.0 - 10.5 K/uL   RBC 4.20 (L) 4.22 - 5.81 MIL/uL   Hemoglobin 13.4 13.0 - 17.0 g/dL   HCT 40.9 (L) 81.1 - 91.4 %   MCV 91.2 78.0 - 100.0 fL   MCH 31.9 26.0 - 34.0 pg   MCHC 35.0 30.0 - 36.0 g/dL   RDW 78.2 95.6 - 21.3 %   Platelets 421 (H) 150 - 400 K/uL   Neutrophils Relative % 80 %   Lymphocytes Relative 8 %   Monocytes Relative 4 %   Eosinophils Relative 8 %   Basophils Relative 0 %   Neutro Abs 13.1 (H) 1.7 - 7.7 K/uL   Lymphs Abs 1.3 0.7 - 4.0 K/uL   Monocytes Absolute 0.7 0.1 - 1.0 K/uL   Eosinophils Absolute 1.3 (H) 0.0 - 0.7 K/uL   Basophils Absolute 0.0 0.0 - 0.1 K/uL   WBC Morphology VACUOLATED NEUTROPHILS   Comprehensive metabolic panel     Status: Abnormal   Collection Time: 05/28/18  3:01 AM  Result Value Ref Range   Sodium 137 135 - 145 mmol/L   Potassium 3.9 3.5 - 5.1 mmol/L   Chloride 101 98 - 111 mmol/L   CO2 23 22 - 32 mmol/L   Glucose, Bld 103 (H) 70 - 99 mg/dL   BUN 35 (H) 8 - 23 mg/dL   Creatinine, Ser 0.86 0.61 - 1.24 mg/dL   Calcium 8.6 (L) 8.9 - 10.3 mg/dL   Total Protein 5.7 (L) 6.5 - 8.1 g/dL   Albumin 2.6 (L) 3.5 - 5.0 g/dL   AST 51 (H) 15 - 41 U/L   ALT 34 0 - 44 U/L   Alkaline Phosphatase 69 38 - 126 U/L   Total Bilirubin 0.9 0.3 - 1.2 mg/dL   GFR calc non Af Amer >60 >60 mL/min   GFR calc Af Amer >60 >60 mL/min   Anion gap 13 5 - 15  CK     Status: Abnormal   Collection Time: 05/28/18  3:01 AM  Result Value Ref Range   Total CK 980 (H) 49 - 397 U/L   Imaging Studies: Ct Head Wo Contrast  Result Date: 05/28/2018 CLINICAL DATA:  Visual loss or uveitis/scleritis EXAM: CT  HEAD WITHOUT CONTRAST TECHNIQUE: Contiguous axial images were obtained from the base of the skull through the vertex without intravenous contrast. COMPARISON:  Remote head CT 06/02/2005 FINDINGS: Brain: Progressive atrophy and chronic small vessel ischemia from 2006 exam. No intracranial hemorrhage, mass effect, or midline shift.  No hydrocephalus. The basilar cisterns are patent. No evidence of territorial infarct or acute ischemia. No extra-axial or intracranial fluid collection. Vascular: No hyperdense vessel. Skull: No fracture or focal lesion. Sinuses/Orbits: Paranasal sinuses and mastoid air cells are clear. The visualized orbits are unremarkable. Other: None. IMPRESSION: 1.  No acute intracranial abnormality. 2. Atrophy with moderate to advanced chronic small vessel ischemia. Electronically Signed   By: Narda Rutherford M.D.   On: 05/28/2018 03:53    ED COURSE and MDM  Nursing notes and initial vitals signs, including pulse oximetry, reviewed.  Vitals:   05/28/18 0237 05/28/18 0300 05/28/18 0400 05/28/18 0430  BP:  (!) 143/94 (!) 148/80 (!) 148/86  Pulse:  97 100 100  Resp:  17 (!) 21 15  Temp:      TempSrc:      SpO2:  98% 97% 96%  Weight: 74.8 kg     Height: 5\' 10"  (1.778 m)      5:21 AM Patient has difficulty ambulating even with assistance.  This is a change from his baseline.  The patient does not have a primary care physician and he relies almost exclusively on homeopathic remedies at home.   6:08 AM Dr. Katrinka Blazing of hospitalist service consulted for admission.  He requests we start antibiotics.  Ancef 1 g IV ordered.  PROCEDURES    ED DIAGNOSES     ICD-10-CM   1. Generalized weakness R53.1   2. Chronic venous stasis dermatitis of both lower extremities I87.2        Danaly Bari, Jonny Ruiz, MD 05/28/18 [phone]

## 2018-05-28 NOTE — ED Notes (Signed)
Bed: [id number] Expected date:  Expected time:  Means of arrival:  Comments: EMS 84 yo fall/general weakness

## 2018-05-28 NOTE — Plan of Care (Signed)
Discussed with Dr. Read DriversMolpus. Mr. Justin [name] is a 84 year old male with pmh venous stasis ulcers; who presents after being found unable to get up of the floor since around 3 PM with reports of visual changes.  Patient meeting SIRS criteria.  WBC 16.4, platelets 421, BUN 35, creatinine 0.89, CK 980.  Patient was given 500 mL of saline IV fluids and started on ceftezolin for venous stasis wounds that appeared possibly being infected.  Accepted as inpatient to a telemetry bed.  Added-on lactic acid level and placed on normal saline IV fluids at 75 mL/h.

## 2018-05-28 NOTE — Progress Notes (Signed)
Called to ED for report, pts RN stated that the hospitalist is in the room currently which has delayed calling report and transferring pt.

## 2018-05-28 NOTE — ED Triage Notes (Signed)
Wife states patient has not eaten since Monday. Wife states patient must have fallen sometime this afternoon because she was conversing with him on the house phone and his cell phone earlier in the day. Patient states he could not get downstairs to eat because "I was weak and my legs were in pain".

## 2018-05-28 NOTE — ED Notes (Signed)
ED TO INPATIENT HANDOFF REPORT  Name/Age/Gender Justin [name] 84 y.o. male  Code Status Code Status History    Date Active Date Inactive Code Status Order ID Comments User Context   09/29/2012 1833 10/07/2012 1702 Full Code [id number]  Bonnielee Haff, MD Inpatient      Home/SNF/Other Home  Chief Complaint Weakness  Level of Care/Admitting Diagnosis ED Disposition    ED Disposition Condition [place]: [facility] [[id number]]  Level of Care: Telemetry [5]  Admit to tele based on following criteria: Complex arrhythmia (Bradycardia/Tachycardia)  Diagnosis: Venous stasis ulcer [facility]) [[id number]]  Admitting Physician: Norval Morton [[id number]]  Attending Physician: Norval Morton [[id number]]  Estimated length of stay: past midnight tomorrow  Certification:: I certify this patient will need inpatient services for at least 2 midnights  PT Class (Do Not Modify): Inpatient [101]  PT Acc Code (Do Not Modify): Private [1]       Medical History Past Medical History:  Diagnosis Date  . Erysipelas   . Scarlet fever   . Venous stasis ulcers of both lower extremities (HCC)     Allergies No Known Allergies  IV Location/Drains/Wounds Patient Lines/Drains/Airways Status   Active Line/Drains/Airways    Name:   Placement date:   Placement time:   Site:   Days:   Peripheral IV 05/28/18 Left;Posterior Wrist   05/28/18    0258    Wrist   less than 1          Labs/Imaging Results for orders placed or performed during the hospital encounter of 05/28/18 (from the past 48 hour(s))  CBC with Differential/Platelet     Status: Abnormal   Collection Time: 05/28/18  3:01 AM  Result Value Ref Range   WBC 16.4 (H) 4.0 - 10.5 K/uL   RBC 4.20 (L) 4.22 - 5.81 MIL/uL   Hemoglobin 13.4 13.0 - 17.0 g/dL   HCT 38.3 (L) 39.0 - 52.0 %   MCV 91.2 78.0 - 100.0 fL   MCH 31.9 26.0 - 34.0 pg   MCHC 35.0 30.0 - 36.0 g/dL   RDW 13.8 11.5 - 15.5 %   Platelets 421  (H) 150 - 400 K/uL   Neutrophils Relative % 80 %   Lymphocytes Relative 8 %   Monocytes Relative 4 %   Eosinophils Relative 8 %   Basophils Relative 0 %   Neutro Abs 13.1 (H) 1.7 - 7.7 K/uL   Lymphs Abs 1.3 0.7 - 4.0 K/uL   Monocytes Absolute 0.7 0.1 - 1.0 K/uL   Eosinophils Absolute 1.3 (H) 0.0 - 0.7 K/uL   Basophils Absolute 0.0 0.0 - 0.1 K/uL   WBC Morphology VACUOLATED NEUTROPHILS     Comment: Performed at [facility], [place] [address], [place], [place] [address]  Comprehensive metabolic panel     Status: Abnormal   Collection Time: 05/28/18  3:01 AM  Result Value Ref Range   Sodium 137 135 - 145 mmol/L   Potassium 3.9 3.5 - 5.1 mmol/L   Chloride 101 98 - 111 mmol/L   CO2 23 22 - 32 mmol/L   Glucose, Bld 103 (H) 70 - 99 mg/dL   BUN 35 (H) 8 - 23 mg/dL   Creatinine, Ser 0.89 0.61 - 1.24 mg/dL   Calcium 8.6 (L) 8.9 - 10.3 mg/dL   Total Protein 5.7 (L) 6.5 - 8.1 g/dL   Albumin 2.6 (L) 3.5 - 5.0 g/dL   AST 51 (H) 15 -  41 U/L   ALT 34 0 - 44 U/L   Alkaline Phosphatase 69 38 - 126 U/L   Total Bilirubin 0.9 0.3 - 1.2 mg/dL   GFR calc non Af Amer >60 >60 mL/min   GFR calc Af Amer >60 >60 mL/min    Comment: (NOTE) The eGFR has been calculated using the CKD EPI equation. This calculation has not been validated in all clinical situations. eGFR's persistently <60 mL/min signify possible Chronic Kidney Disease.    Anion gap 13 5 - 15    Comment: Performed at [facility], [place] [address] [place], [place] [address]  CK     Status: Abnormal   Collection Time: 05/28/18  3:01 AM  Result Value Ref Range   Total CK 980 (H) 49 - 397 U/L    Comment: Performed at [facility], [place] [address] [place], [place] [address]  Urinalysis, Routine w reflex microscopic     Status: Abnormal   Collection Time: 05/28/18  6:45 AM  Result Value Ref Range   Color, Urine YELLOW YELLOW   APPearance CLEAR CLEAR   Specific Gravity, Urine 1.024  1.005 - 1.030   pH 5.0 5.0 - 8.0   Glucose, UA NEGATIVE NEGATIVE mg/dL   Hgb urine dipstick NEGATIVE NEGATIVE   Bilirubin Urine NEGATIVE NEGATIVE   Ketones, ur 80 (A) NEGATIVE mg/dL   Protein, ur 30 (A) NEGATIVE mg/dL   Nitrite NEGATIVE NEGATIVE   Leukocytes, UA NEGATIVE NEGATIVE   RBC / HPF 0-5 0 - 5 RBC/hpf   WBC, UA 0-5 0 - 5 WBC/hpf   Bacteria, UA RARE (A) NONE SEEN   Mucus PRESENT    Hyaline Casts, UA PRESENT     Comment: Performed at [facility] [place] [address], [place], [place] [address]   Ct Head Wo Contrast  Result Date: 05/28/2018 CLINICAL DATA:  Visual loss or uveitis/scleritis EXAM: CT HEAD WITHOUT CONTRAST TECHNIQUE: Contiguous axial images were obtained from the base of the skull through the vertex without intravenous contrast. COMPARISON:  Remote head CT 06/02/2005 FINDINGS: Brain: Progressive atrophy and chronic small vessel ischemia from 2006 exam. No intracranial hemorrhage, mass effect, or midline shift. No hydrocephalus. The basilar cisterns are patent. No evidence of territorial infarct or acute ischemia. No extra-axial or intracranial fluid collection. Vascular: No hyperdense vessel. Skull: No fracture or focal lesion. Sinuses/Orbits: Paranasal sinuses and mastoid air cells are clear. The visualized orbits are unremarkable. Other: None. IMPRESSION: 1.  No acute intracranial abnormality. 2. Atrophy with moderate to advanced chronic small vessel ischemia. Electronically Signed   By: Keith Rake M.D.   On: 05/28/2018 03:53    Pending Labs Unresulted Labs (From admission, onward)    Start     Ordered   05/28/18 0611  Lactic acid, plasma  Add-on,   R     05/28/18 0610          Vitals/Pain Today's Vitals   05/28/18 0630 05/28/18 0700 05/28/18 0730 05/28/18 0800  BP: 136/71 137/70 132/70 (!) 140/104  Pulse: 95 95 94 99  Resp: 15 19 (!) 21 16  Temp:      TempSrc:      SpO2: 96% 96% 95% 96%  Weight:      Height:      PainSc:         Isolation Precautions No active isolations  Medications Medications  0.9 %  sodium chloride infusion ( Intravenous New Bag/Given 05/28/18 0659)  sodium chloride 0.9 %  bolus 500 mL (0 mLs Intravenous Stopped 05/28/18 0337)  acetaminophen (TYLENOL) tablet 650 mg (650 mg Oral Given 05/28/18 0558)  ceFAZolin (ANCEF) IVPB 1 g/50 mL premix (0 g Intravenous Stopped 05/28/18 0728)    Mobility walks with device

## 2018-05-28 NOTE — Progress Notes (Signed)
Bladder scan showed 206 ml.

## 2018-05-29 ENCOUNTER — Encounter (HOSPITAL_COMMUNITY): Payer: Self-pay | Admitting: Nephrology

## 2018-05-29 DIAGNOSIS — R531 Weakness: Secondary | ICD-10-CM

## 2018-05-29 DIAGNOSIS — L03119 Cellulitis of unspecified part of limb: Secondary | ICD-10-CM

## 2018-05-29 DIAGNOSIS — I872 Venous insufficiency (chronic) (peripheral): Secondary | ICD-10-CM

## 2018-05-29 DIAGNOSIS — I83008 Varicose veins of unspecified lower extremity with ulcer other part of lower leg: Secondary | ICD-10-CM

## 2018-05-29 DIAGNOSIS — L97801 Non-pressure chronic ulcer of other part of unspecified lower leg limited to breakdown of skin: Secondary | ICD-10-CM

## 2018-05-29 LAB — CBC
HEMATOCRIT: 37.3 % — AB (ref 39.0–52.0)
Hemoglobin: 12.8 g/dL — ABNORMAL LOW (ref 13.0–17.0)
MCH: 31.8 pg (ref 26.0–34.0)
MCHC: 34.3 g/dL (ref 30.0–36.0)
MCV: 92.6 fL (ref 78.0–100.0)
Platelets: 404 10*3/uL — ABNORMAL HIGH (ref 150–400)
RBC: 4.03 MIL/uL — ABNORMAL LOW (ref 4.22–5.81)
RDW: 14.2 % (ref 11.5–15.5)
WBC: 14.8 10*3/uL — ABNORMAL HIGH (ref 4.0–10.5)

## 2018-05-29 LAB — COMPREHENSIVE METABOLIC PANEL
ALBUMIN: 2.3 g/dL — AB (ref 3.5–5.0)
ALT: 33 U/L (ref 0–44)
AST: 42 U/L — AB (ref 15–41)
Alkaline Phosphatase: 69 U/L (ref 38–126)
Anion gap: 11 (ref 5–15)
BILIRUBIN TOTAL: 0.5 mg/dL (ref 0.3–1.2)
BUN: 35 mg/dL — AB (ref 8–23)
CHLORIDE: 106 mmol/L (ref 98–111)
CO2: 23 mmol/L (ref 22–32)
Calcium: 8.5 mg/dL — ABNORMAL LOW (ref 8.9–10.3)
Creatinine, Ser: 0.82 mg/dL (ref 0.61–1.24)
GFR calc Af Amer: >60 mL/min (ref >60)
GFR calc non Af Amer: >60 mL/min (ref >60)
GLUCOSE: 110 mg/dL — AB (ref 70–99)
POTASSIUM: 3.4 mmol/L — AB (ref 3.5–5.1)
Sodium: 140 mmol/L (ref 135–145)
Total Protein: 5.2 g/dL — ABNORMAL LOW (ref 6.5–8.1)

## 2018-05-29 LAB — AMMONIA: Ammonia: 11 umol/L (ref 9–35)

## 2018-05-29 MED ORDER — ACETAMINOPHEN 325 MG PO TABS
325.0000 mg | ORAL_TABLET | Freq: Four times a day (QID) | ORAL | Status: DC | PRN
  Administered 2018-06-01: 650 mg via ORAL
  Filled 2018-05-29: qty 2

## 2018-05-29 MED ORDER — IBUPROFEN 200 MG PO TABS
400.0000 mg | ORAL_TABLET | Freq: Four times a day (QID) | ORAL | Status: DC | PRN
  Administered 2018-05-29 – 2018-06-02 (×4): 400 mg via ORAL
  Filled 2018-05-29 (×4): qty 2

## 2018-05-29 NOTE — Progress Notes (Signed)
Pt was found pulling off his gown & had also pulled out his IV, pt states he was just trying to change clothes. Alert & knows he is in the hospital, spilt urine in the bed

## 2018-05-29 NOTE — Progress Notes (Signed)
Called about urinary retention by bladder scan earlier w/ about 350 cc retained. Pt not voiding, RN's unable to get foley to pass, another RN tried coude.  Called urology on call but didn't hear back.    Vinson Moselleob Ketrick Matney MD [facility] 02/07/2018, 9:25 AM

## 2018-05-29 NOTE — Progress Notes (Signed)
Dr. Arlean HoppingSchertz aware via text of recent bladder scan results of >400 ml. Pt unable to void at current time. Awaiting response.

## 2018-05-29 NOTE — Progress Notes (Signed)
MD rounding. Updated on pt status. To see pt soon.

## 2018-05-29 NOTE — Progress Notes (Signed)
Dr. Arlean HoppingSchertz in to see pt. Md updated on pt's inability to void and failed attempts of nurses as well as urology RN to place a catheter yesterday. Dressings removed from legs for Md to assess venous stasis ulcers on rt and lt leg.

## 2018-05-29 NOTE — Progress Notes (Addendum)
[facility] Progress Note  Subjective: altered mental status resolved ovenright, per staff pt is appropriate now .  Staff says pt not able to void, they couldn't cath him either, bladder scans are 300- 400 cc.  Pt w/o urinary c/o.   Vitals:   05/29/18 0400 05/29/18 0402 05/29/18 0745 05/29/18 1400  BP: 119/78  137/72 125/69  Pulse: 80  81 85  Resp: (!) 22  17 13   Temp: 98.7 F (37.1 C) 98.5 F (36.9 C) (!) 97.4 F (36.3 C) 98 F (36.7 C)  TempSrc:   Oral Oral  SpO2: 96%  99% 95%  Weight:      Height:        Inpatient medications: . enoxaparin (LOVENOX) injection  40 mg Subcutaneous Q24H  . ibuprofen  400 mg Oral QID  . sodium chloride flush  3 mL Intravenous Q12H   . sodium chloride 100 mL/hr at 05/29/18 1230  .  ceFAZolin (ANCEF) IV 1 g (05/29/18 1340)   haloperidol lactate  Exam: Awake, alert, some slurred speech, Ox 3 today No icterus no pallor, + frail-appearing to some degree OMI NCAT without distress Cor Reg no MRG Chest clear no added sound no rales no rhonchi Abdomen soft nontender nondistended no rebound no guarding Neurologically seems intact and coherent Lower extremities have weeping areas on both legs with foul odor, L> R Musculoskeletal able to bend knees Neurologically grossly intact moves all 4 limbs equally formal testing deferred Skin as above, +palpable pulses in both lower extremities  Presentation Summary: 84 year old male generalized weakness bringing him to the emergency room, has known history of probable cellulitis, venous stasis dermatitis, prior lower extremity DVT on Xarelto in the past.  Note that the patient was admitted in 2014 with possible infection and was kept on vancomycin and switch to doxycycline and Keflex and MRI done at that time showed only cellulitis of LLE it was felt that he could have erysipelas at the time and it was recommended that he follow-up with ID. Pt stated that after his last hospital stay in 2014 he obtained  silver socks and was treating his legs with colloid of silver [patient is an Art gallery manager of some type].  He also had a DVT in the same left leg at that time and was dc'd on Xarelto.  Note that he takes multiple over-the-counter medications.  Regarding this admission, patient had a fall at home and he "thought I was going to die". He had to scoot across the floor and could not get up because of weakness in his legs and inability to eat over the past couple of days-states that the wound on his legs started after scratching his leg with the car door about 6 months ago and has festered on since then.  ED Course: Patient was started on IV saline at 75 cc/h as well as Ancef       Hospital Problems/ Course:  1) Bilat LE stasis ulcers w/ sepsis:Prior admit for LLE cellulitis/ erysipelas w/ LLE DVT in 2014.  Marked skin changes w/ numerous ulcerated areas on L lower > R lower leg.  -  continue Ancef IV -  pain control , will use non-narcotic medications, pt had strong reaction to Ultram yesterday that required Haldol IV x 1.  NSAID's/tylenol ordered on prn basis -  Seen by wound team, appreciate rec's, we are wrapping the wounds now w/ xeroform - ABI's suggested by wound team, have been ordered  2) Volume depletion Hydrate with saline 100 cc/h-repeat  labs a.m.-etiology secondary to poor p.o. intake recently per history  3)  Altered mental status: occurred last night after am dose of tramadol. Was psychotic and/ or depressed. Suicide sitter ordered.  Got one dose of IV haldol and one dose po zyprexa 5mg  yesterday evening and today MS if back to normal.  Patient is Ox 3 and conversant, albeit a little tired in appearance. No SI.  - psych eval cancelled - avoid narcotics and ultram, using nsaid's and tylenol for pain - dc sitter  4) Fall: due to pain, bilat leg infection and some dehydration - PT was consulted, pt not steady on his feet, but was put in the chair and sat 3.5hr  5) Urinary retention,  possible: pt c/o some mild dysuria, UA here is negative.  Bladder scan 300- 400 cc, RN's tried to do I/O cath w/o success.  Will observe overnight, may need urology input in the morning if not resolved.       Code Status: DNR DVT Prophylaxis: lovenox Family Communication: none at bedside Disposition Plan: home when legs improving Status: Inpatient   Vinson Moselleob Bethzaida Boord MD [facility] pgr [phone] 05/29/2018, 4:14 PM   Recent Labs  Lab 05/28/18 0301 05/28/18 1014 05/29/18 0519  NA 137  --  140  K 3.9  --  3.4*  CL 101  --  106  CO2 23  --  23  GLUCOSE 103*  --  110*  BUN 35*  --  35*  CREATININE 0.89 0.97 0.82  CALCIUM 8.6*  --  8.5*   Recent Labs  Lab 05/28/18 0301 05/29/18 0519  AST 51* 42*  ALT 34 33  ALKPHOS 69 69  BILITOT 0.9 0.5  PROT 5.7* 5.2*  ALBUMIN 2.6* 2.3*   Recent Labs  Lab 05/28/18 0301 05/28/18 1014 05/29/18 0519  WBC 16.4* 15.8* 14.8*  NEUTROABS 13.1*  --   --   HGB 13.4 12.9* 12.8*  HCT 38.3* 37.5* 37.3*  MCV 91.2 91.9 92.6  PLT 421* 388 404*   Iron/TIBC/Ferritin/ %Sat No results found for: IRON, TIBC, FERRITIN, IRONPCTSAT

## 2018-05-29 NOTE — Evaluation (Signed)
Physical Therapy Evaluation Patient Details Name: Justin [name] MRN: [id number] DOB: September 05, 1928 Today's Date: 05/29/2018   History of Present Illness  84yo male who presented to the ED with generalized weakness and a fall at home, diagnosed with stasis ulcers with sepsis, non-traumatic rhabdomyolysis, and fall. PMH scarlet fever, venous stasis ulcers, hx DVT   Clinical Impression   Patient received in bed, willing to participate in skilled PT session but requiring significant encouragement from therapist to participate. Able to complete functional bed mobility with MinA and extended time, and functional transfers with Min guardx2 no device, gait approximately 14f in room with Min guard x2 for safety. Poor navigation and impulsive behaviors noted during gait and throughout session as a whole. Patient able to answer majority of orientation questions and appears to be A&Ox3, but appears to have difficulty following simple cues from therapist for mobility and is also somewhat impulsive requiring Mod cues for safety and sequencing. Patient often quite contrary with therapist and requires VC for participation and behavior. He was left up in the chair with all needs met, nursing sitter present, and all concerns/questions addressed this morning. He will continue to benefit from skilled PT services in the acute setting, as well as ongoing skilled PT services in the ST-SNF setting to further address functional deficits and reduce fall risk moving forward.     Follow Up Recommendations SNF;Supervision/Assistance - 24 hour    Equipment Recommendations  Other (comment)(defer to next venue )    Recommendations for Other Services       Precautions / Restrictions Precautions Precautions: Fall;Other (comment) Precaution Comments: impulsive, can be quite contrary  Restrictions Weight Bearing Restrictions: No      Mobility  Bed Mobility Overal bed mobility: Needs Assistance Bed Mobility: Supine to  Sit     Supine to sit: Min assist     General bed mobility comments: able to progress legs off EOB with extended time, requires MinA to bring trunk up to EOB, able to scoot around at EOB with extended time   Transfers Overall transfer level: Needs assistance Equipment used: Rolling walker (2 wheeled) Transfers: Sit to/from Stand Sit to Stand: Min guard         General transfer comment: Min guard, extended time, cues for safety   Ambulation/Gait Ambulation/Gait assistance: Min guard;+2 physical assistance Gait Distance (Feet): 5 Feet Assistive device: 2 person hand held assist Gait Pattern/deviations: Step-to pattern;Decreased step length - right;Decreased step length - left;Decreased stride length;Decreased dorsiflexion - right;Decreased dorsiflexion - left;Shuffle;Drifts right/left;Narrow base of support;Trunk flexed     General Gait Details: very impulsive with gait, Min guard x2 for safety, poor navigation in room. Trialed with no device at eval, would benefit from RW next session   Stairs            Wheelchair Mobility    Modified Rankin (Stroke Patients Only)       Balance Overall balance assessment: History of Falls;Needs assistance Sitting-balance support: Bilateral upper extremity supported;Feet supported Sitting balance-Leahy Scale: Fair   Postural control: Posterior lean   Standing balance-Leahy Scale: Poor                               Pertinent Vitals/Pain Pain Assessment: Faces Faces Pain Scale: Hurts little more Pain Location: B LEs  Pain Descriptors / Indicators: Aching;Sore Pain Intervention(s): Limited activity within patient's tolerance;Monitored during session    Home Living Family/patient expects to be discharged to::  Private residence Living Arrangements: Spouse/significant other Available Help at Discharge: Family;Available 24 hours/day Type of Home: House Home Access: Stairs to enter Entrance Stairs-Rails:  None Entrance Stairs-Number of Steps: 4 Home Layout: Two level Home Equipment: None      Prior Function Level of Independence: Independent               Hand Dominance        Extremity/Trunk Assessment   Upper Extremity Assessment Upper Extremity Assessment: Defer to OT evaluation    Lower Extremity Assessment Lower Extremity Assessment: Generalized weakness    Cervical / Trunk Assessment Cervical / Trunk Assessment: Kyphotic  Communication   Communication: No difficulties  Cognition Arousal/Alertness: Awake/alert Behavior During Therapy: Impulsive;Flat affect;Restless Overall Cognitive Status: Impaired/Different from baseline Area of Impairment: Orientation;Attention;Memory;Following commands;Safety/judgement;Awareness;Problem solving                 Orientation Level: Person;Place;Situation Current Attention Level: Sustained Memory: Decreased recall of precautions;Decreased short-term memory Following Commands: Follows one step commands inconsistently;Follows one step commands with increased time;Follows multi-step commands inconsistently Safety/Judgement: Decreased awareness of safety;Decreased awareness of deficits Awareness: Intellectual Problem Solving: Slow processing;Decreased initiation;Difficulty sequencing;Requires verbal cues;Requires tactile cues        General Comments      Exercises     Assessment/Plan    PT Assessment Patient needs continued PT services  PT Problem List Decreased strength;Decreased mobility;Decreased safety awareness;Decreased coordination;Decreased knowledge of precautions;Decreased cognition;Decreased activity tolerance;Decreased balance;Pain       PT Treatment Interventions DME instruction;Therapeutic activities;Gait training;Therapeutic exercise;Patient/family education;Stair training;Balance training;Functional mobility training;Neuromuscular re-education    PT Goals (Current goals can be found in the Care  Plan section)  Acute Rehab PT Goals PT Goal Formulation: Patient unable to participate in goal setting    Frequency Min 2X/week   Barriers to discharge        Co-evaluation               AM-PAC PT "6 Clicks" Daily Activity  Outcome Measure Difficulty turning over in bed (including adjusting bedclothes, sheets and blankets)?: Unable Difficulty moving from lying on back to sitting on the side of the bed? : Unable Difficulty sitting down on and standing up from a chair with arms (e.g., wheelchair, bedside commode, etc,.)?: Unable Help needed moving to and from a bed to chair (including a wheelchair)?: A Little Help needed walking in hospital room?: A Lot Help needed climbing 3-5 steps with a railing? : A Lot 6 Click Score: 10    End of Session Equipment Utilized During Treatment: Gait belt Activity Tolerance: Patient tolerated treatment well Patient left: in chair;with call bell/phone within reach;with nursing/sitter in room Nurse Communication: Mobility status PT Visit Diagnosis: Unsteadiness on feet (R26.81);Muscle weakness (generalized) (M62.81);History of falling (Z91.81);Other abnormalities of gait and mobility (R26.89)    Time: 1030-1110 PT Time Calculation (min) (ACUTE ONLY): 40 min   Charges:   PT Evaluation $PT Eval Moderate Complexity: 1 Mod PT Treatments $Therapeutic Activity: 23-37 mins        Deniece Ree PT, DPT, CBIS  Supplemental Physical Therapist [place]    Pager [phone] Acute Rehab Office [phone]

## 2018-05-29 NOTE — Progress Notes (Signed)
Dr. Arlean HoppingSchertz paged regarding pt's voiding status. Able to void 20-30 ml of pink tinged urine recently. Bladder scan following showed 335 ml in bladder. Denies discomfort. Stated "It burns when I urinated." Awaiting call back from MD.

## 2018-05-30 ENCOUNTER — Inpatient Hospital Stay (HOSPITAL_COMMUNITY): Payer: Medicare Other

## 2018-05-30 DIAGNOSIS — L0390 Cellulitis, unspecified: Secondary | ICD-10-CM

## 2018-05-30 LAB — CBC
HCT: 36 % — ABNORMAL LOW (ref 39.0–52.0)
HEMOGLOBIN: 12.3 g/dL — AB (ref 13.0–17.0)
MCH: 31.5 pg (ref 26.0–34.0)
MCHC: 34.2 g/dL (ref 30.0–36.0)
MCV: 92.1 fL (ref 78.0–100.0)
Platelets: 421 10*3/uL — ABNORMAL HIGH (ref 150–400)
RBC: 3.91 MIL/uL — AB (ref 4.22–5.81)
RDW: 14.2 % (ref 11.5–15.5)
WBC: 14 10*3/uL — AB (ref 4.0–10.5)

## 2018-05-30 LAB — BASIC METABOLIC PANEL
ANION GAP: 9 (ref 5–15)
BUN: 31 mg/dL — AB (ref 8–23)
CHLORIDE: 110 mmol/L (ref 98–111)
CO2: 22 mmol/L (ref 22–32)
Calcium: 8.5 mg/dL — ABNORMAL LOW (ref 8.9–10.3)
Creatinine, Ser: 0.95 mg/dL (ref 0.61–1.24)
GFR calc non Af Amer: >60 mL/min (ref >60)
Glucose, Bld: 118 mg/dL — ABNORMAL HIGH (ref 70–99)
POTASSIUM: 3.6 mmol/L (ref 3.5–5.1)
SODIUM: 141 mmol/L (ref 135–145)

## 2018-05-30 MED ORDER — LORAZEPAM 2 MG/ML IJ SOLN
1.0000 mg | Freq: Once | INTRAMUSCULAR | Status: AC
  Administered 2018-05-30: 1 mg via INTRAVENOUS
  Filled 2018-05-30: qty 1

## 2018-05-30 MED ORDER — TAMSULOSIN HCL 0.4 MG PO CAPS
0.4000 mg | ORAL_CAPSULE | Freq: Every day | ORAL | Status: DC
  Administered 2018-05-31 – 2018-06-02 (×3): 0.4 mg via ORAL
  Filled 2018-05-30 (×3): qty 1

## 2018-05-30 MED ORDER — ORAL CARE MOUTH RINSE
15.0000 mL | Freq: Two times a day (BID) | OROMUCOSAL | Status: DC
  Administered 2018-05-30 – 2018-05-31 (×3): 15 mL via OROMUCOSAL

## 2018-05-30 NOTE — Progress Notes (Signed)
Pt continues to irritable, attempting to slide out foot of bed, not sure how his condom cath came off, spilled his water on bedside table on floor & in bed, incontinent of urine, attempts to reorient pt  Causes patient to become argumentative. Pt c/o pain & inability to sleep but has maintained he does not want anything put in his body. Haldol will need to be given

## 2018-05-30 NOTE — Progress Notes (Signed)
Pt has been complaining about any and everything that we attempt to do for him ( water to drink was too cold, lights too bright), restless, trying to get out of bed (bed alarm is on)experiencing urinary incontinence, placed a condom cath on him and all he could do was complain that it didn't work before.

## 2018-05-30 NOTE — Progress Notes (Addendum)
VASCULAR LAB PRELIMINARY  ARTERIAL  ABI completed:    RIGHT    LEFT    PRESSURE WAVEFORM  PRESSURE WAVEFORM  BRACHIAL 140 Triphasic BRACHIAL 139 Triphasic  DP 193 Triphasic DP 157 Triphasic  PT 183 Triphasic PT 202 Triphasic  GREAT TOE 185 NA GREAT TOE 0.75 NA    RIGHT LEFT  ABI / TBI 1.38 / 1.32 1.44 / 0.75   ABIs indicate possible calcification of the vessels with normal Doppler waveforms. TBIs are normal bilaterally  Justin [name], RVS 05/30/2018, 9:03 AM

## 2018-05-30 NOTE — Progress Notes (Signed)
[facility]                                                                              Patient Demographics  Justin [name], is a 84 y.o. male, DOB - [birth year], NGE:[id number]  Admit date - 05/28/2018   Admitting Physician Rhetta Mura, MD  Outpatient Primary MD for the patient is Patient, No Pcp Per  Outpatient specialists:   LOS - 2  days   Medical records reviewed and are as summarized below:    Chief Complaint  Patient presents with  . Weakness       Brief summary   Patient is a 84 year old male with history of DVT, bilateral venous stasis presented to ED with cellulitis of lower extremities..  Patient had a fall and was unable to get up because of his weakness in the legs and inability to eat over the past couple of days.  Patient reported that the wound on his legs started after scratching his leg with the car door about 6 months ago and has been worsening since then.    Assessment & Plan   Bilateral lower extremity stasis ulcers with sepsis -Prior to admission with left lower extremity cellulitis, erysipelas with the left lower extremity DVT in 2014, marked skin changes with ulcerated areas on the left lower leg> right -Continue IV Ancef, pain control -Wound care -ABI's indicate possible calcification of the vessels with normal Doppler waveforms, TBI normal bilaterally  Acute metabolic encephalopathy -Lethargic, discussed with RN, patient had received Ativan earlier this morning at 5:13 AM and Haldol at 2 AM -Avoid Ativan as patient has exaggerated response, currently somnolent  Acute urine retention likely due to BPH and altered mental status -On examination, lower abdominal fullness, had issues with urinary retention overnight -Bladder scan showed about 800 cc -Urology consulted, 29 French coud catheter placed with return of thousand CCM by urine -UA negative for UTI on 9/13  Dehydration with failure to thrive -Continue IV fluid  hydration until patient alert and oriented to take p.o.  Falls PT OT evaluation  Code Status: dnr  DVT Prophylaxis: Lovenox Family Communication: Discussed in detail with the patient, all imaging results, lab results explained to the patient's wife    Disposition Plan:   Time Spent in minutes   Procedures:    Consultants:   urology  Antimicrobials:      Medications  Scheduled Meds: . enoxaparin (LOVENOX) injection  40 mg Subcutaneous Q24H  . mouth rinse  15 mL Mouth Rinse BID  . sodium chloride flush  3 mL Intravenous Q12H   Continuous Infusions: . sodium chloride 30 mL/hr at 05/30/18 0025  .  ceFAZolin (ANCEF) IV 1 g (05/30/18 0731)   PRN Meds:.acetaminophen, haloperidol lactate, ibuprofen   Antibiotics   Anti-infectives (From admission, onward)   Start     Dose/Rate Route Frequency Ordered Stop   05/28/18 1500  ceFAZolin (ANCEF) IVPB 1 g/50 mL premix     1 g 100 mL/hr over 30 Minutes Intravenous Every 8 hours 05/28/18 1334     05/28/18 0615  ceFAZolin (ANCEF) IVPB 1 g/50 mL premix     1  g 100 mL/hr over 30 Minutes Intravenous  Once 05/28/18 [id number] 05/28/18 [id number]        Subjective:   Justin [name] was seen and examined today. Somnolent, received ativan and haldol overnight. Urinary retention overnight.      Objective:   Vitals:   05/29/18 0745 05/29/18 1400 05/29/18 2018 05/30/18 0420  BP: 137/72 125/69 131/71 127/66  Pulse: 81 85 90 98  Resp: 17 13 (!) 22 20  Temp: (!) 97.4 F (36.3 C) 98 F (36.7 C) 98.4 F (36.9 C) 99 F (37.2 C)  TempSrc: Oral Oral Oral Oral  SpO2: 99% 95% 94%   Weight:      Height:        Intake/Output Summary (Last 24 hours) at 05/30/2018 1312 Last data filed at 05/30/2018 1100 Gross per 24 hour  Intake 60 ml  Output 940 ml  Net -880 ml     Wt Readings from Last 3 Encounters:  05/28/18 74.8 kg  09/25/12 73.8 kg  09/25/12 72.6 kg     Exam  General: somnolent, not following commands   Eyes:    HEENT:  Atraumatic, normocephalic  Cardiovascular: S1 S2 auscultated, Regular rate and rhythm.  Respiratory: Clear to auscultation bilaterally, no wheezing, rales or rhonchi  Gastrointestinal: Soft,  Lower abd distended, + bowel sounds  Ext: no pedal edema bilaterally  Neuro: cannot assess  Musculoskeletal: No digital cyanosis, clubbing  Skin: No rashes  Psych: somnolent   Data Reviewed:  I have personally reviewed following labs and imaging studies  Micro Results No results found for this or any previous visit (from the past 240 hour(s)).  Radiology Reports Ct Head Wo Contrast  Result Date: 05/28/2018 CLINICAL DATA:  Visual loss or uveitis/scleritis EXAM: CT HEAD WITHOUT CONTRAST TECHNIQUE: Contiguous axial images were obtained from the base of the skull through the vertex without intravenous contrast. COMPARISON:  Remote head CT 06/02/2005 FINDINGS: Brain: Progressive atrophy and chronic small vessel ischemia from 2006 exam. No intracranial hemorrhage, mass effect, or midline shift. No hydrocephalus. The basilar cisterns are patent. No evidence of territorial infarct or acute ischemia. No extra-axial or intracranial fluid collection. Vascular: No hyperdense vessel. Skull: No fracture or focal lesion. Sinuses/Orbits: Paranasal sinuses and mastoid air cells are clear. The visualized orbits are unremarkable. Other: None. IMPRESSION: 1.  No acute intracranial abnormality. 2. Atrophy with moderate to advanced chronic small vessel ischemia. Electronically Signed   By: Narda RutherfordMelanie  Sanford M.D.   On: 05/28/2018 03:53    Lab Data:  CBC: Recent Labs  Lab 05/28/18 0301 05/28/18 1014 05/29/18 0519 05/30/18 0524  WBC 16.4* 15.8* 14.8* 14.0*  NEUTROABS 13.1*  --   --   --   HGB 13.4 12.9* 12.8* 12.3*  HCT 38.3* 37.5* 37.3* 36.0*  MCV 91.2 91.9 92.6 92.1  PLT 421* 388 404* 421*   Basic Metabolic Panel: Recent Labs  Lab 05/28/18 0301 05/28/18 1014 05/29/18 0519 05/30/18 0524   NA 137  --  140 141  K 3.9  --  3.4* 3.6  CL 101  --  106 110  CO2 23  --  23 22  GLUCOSE 103*  --  110* 118*  BUN 35*  --  35* 31*  CREATININE 0.89 0.97 0.82 0.95  CALCIUM 8.6*  --  8.5* 8.5*   GFR: Estimated Creatinine Clearance: 55.5 mL/min (by C-G formula based on SCr of 0.95 mg/dL). Liver Function Tests: Recent Labs  Lab 05/28/18 0301 05/29/18 0519  AST 51* 42*  ALT  34 33  ALKPHOS 69 69  BILITOT 0.9 0.5  PROT 5.7* 5.2*  ALBUMIN 2.6* 2.3*   No results for input(s): LIPASE, AMYLASE in the last 168 hours. Recent Labs  Lab 05/29/18 0519  AMMONIA 11   Coagulation Profile: No results for input(s): INR, PROTIME in the last 168 hours. Cardiac Enzymes: Recent Labs  Lab 05/28/18 0301  CKTOTAL 980*   BNP (last 3 results) No results for input(s): PROBNP in the last 8760 hours. HbA1C: No results for input(s): HGBA1C in the last 72 hours. CBG: Recent Labs  Lab 05/28/18 1421  GLUCAP 98   Lipid Profile: No results for input(s): CHOL, HDL, LDLCALC, TRIG, CHOLHDL, LDLDIRECT in the last 72 hours. Thyroid Function Tests: No results for input(s): TSH, T4TOTAL, FREET4, T3FREE, THYROIDAB in the last 72 hours. Anemia Panel: No results for input(s): VITAMINB12, FOLATE, FERRITIN, TIBC, IRON, RETICCTPCT in the last 72 hours. Urine analysis:    Component Value Date/Time   COLORURINE YELLOW 05/28/2018 0645   APPEARANCEUR CLEAR 05/28/2018 0645   LABSPEC 1.024 05/28/2018 0645   PHURINE 5.0 05/28/2018 0645   GLUCOSEU NEGATIVE 05/28/2018 0645   HGBUR NEGATIVE 05/28/2018 0645   BILIRUBINUR NEGATIVE 05/28/2018 0645   KETONESUR 80 (A) 05/28/2018 0645   PROTEINUR 30 (A) 05/28/2018 0645   UROBILINOGEN 0.2 09/26/2012 0034   NITRITE NEGATIVE 05/28/2018 0645   LEUKOCYTESUR NEGATIVE 05/28/2018 0645     Ripudeep Rai M.D. [facility] 05/30/2018, 1:12 PM  Pager: [phone] Between 7am to 7pm - call Pager - [phone]  After 7pm go to [email or web address] - password [email or web address]  Call  night coverage person covering after 7pm

## 2018-05-30 NOTE — Progress Notes (Signed)
   05/30/18 2042  MEWS Score  MEWS Score Color Yellow

## 2018-05-30 NOTE — Consult Note (Signed)
Urology Consult Note   Requesting Attending Physician:  Cathren Harsh, MD Service Providing Consult: Urology  Consulting Attending: Dr. Marlou Porch   Reason for Consult:  Urinary retention  HPI: Justin [name] is seen in consultation for reasons noted above at the request of Rai, Ripudeep K, MD for evaluation of urinary retention.  This is a 84 y.o. male who was recently admitted for BLE stasis ulcers/cellulitis w/ sepsis, AMS. Admitted to hospital on 05/28/18. It appears patient has had ongoing, progressive urinary retention since admission. Unclear what patient's baseline voiding habits were prior to admission. He is unable to verbalize any history currently. He has had increasing bladder scans yesterday and overnight from ~300cc to now 700cc this morning. Nursing attempted catheter placement, with coude as well and unsuccessful. Currently with a condom catheter in place with 200cc in bag although ~700cc residual still in bladder. Possible overflow.   There is a nursing note that mentions patient had burning with urination, back when he was communicative. His UA from 05/28/18 was negative.   I do not see any prior urologic history on chart review   Past Medical History: Past Medical History:  Diagnosis Date  . Erysipelas   . Scarlet fever   . Venous stasis ulcers of both lower extremities (HCC)     Past Surgical History:  Past Surgical History:  Procedure Laterality Date  . TONSILLECTOMY      Medication: Current Facility-Administered Medications  Medication Dose Route Frequency Provider Last Rate Last Dose  . 0.9 %  sodium chloride infusion   Intravenous Continuous Delano Metz, MD 30 mL/hr at 05/30/18 0025    . acetaminophen (TYLENOL) tablet 325-650 mg  325-650 mg Oral Q6H PRN Delano Metz, MD      . ceFAZolin (ANCEF) IVPB 1 g/50 mL premix  1 g Intravenous Q8H Rhetta Mura, MD 100 mL/hr at 05/30/18 0731 1 g at 05/30/18 0731  . enoxaparin (LOVENOX) injection 40 mg   40 mg Subcutaneous Q24H Rhetta Mura, MD   40 mg at 05/30/18 0900  . haloperidol lactate (HALDOL) injection 1 mg  1 mg Intravenous Q6H PRN Rhetta Mura, MD   1 mg at 05/30/18 0202  . ibuprofen (ADVIL,MOTRIN) tablet 400 mg  400 mg Oral Q6H PRN Delano Metz, MD   400 mg at 05/30/18 0321  . sodium chloride flush (NS) 0.9 % injection 3 mL  3 mL Intravenous Q12H Rhetta Mura, MD        Allergies: No Known Allergies  Social History: Social History   Tobacco Use  . Smoking status: Former Smoker    Last attempt to quit: 09/15/1962    Years since quitting: 55.7  . Smokeless tobacco: Never Used  Substance Use Topics  . Alcohol use: No  . Drug use: No    Family History Family History  Problem Relation Age of Onset  . Heart disease Mother   . Cancer Mother        leukemia  . Diabetes Neg Hx     Review of Systems 10 systems were reviewed and are negative except as noted specifically in the HPI.  Objective   Vital signs in last 24 hours: BP 127/66   Pulse 98   Temp 99 F (37.2 C) (Oral)   Resp 20   Ht 5\' 10"  (1.778 m)   Wt 74.8 kg   SpO2 94%   BMI 23.68 kg/m   Physical Exam General: NAD, A&O, resting, nonverbal, not alert or oriented HEENT: [place]/AT, EOMI, MMM  Pulmonary: Normal work of breathing Cardiovascular: HDS, adequate peripheral perfusion Abdomen: Soft, NTTP, distended bladder on palpation GU: condom catheter in place with 200cc initially in bag, yellow urine Extremities: warm and well perfused Neuro: Appropriate, no focal neurological deficits  Most Recent Labs: Lab Results  Component Value Date   WBC 14.0 (H) 05/30/2018   HGB 12.3 (L) 05/30/2018   HCT 36.0 (L) 05/30/2018   PLT 421 (H) 05/30/2018    Lab Results  Component Value Date   NA 141 05/30/2018   K 3.6 05/30/2018   CL 110 05/30/2018   CO2 22 05/30/2018   BUN 31 (H) 05/30/2018   CREATININE 0.95 05/30/2018   CALCIUM 8.5 (L) 05/30/2018    Lab Results  Component Value  Date   INR 1.08 09/29/2012     IMAGING: No results found.    Foley Catheter Placement Note  Indications: 84 y.o. male with AMS, urinary retention  Pre-operative Diagnosis: Urinary retention  Post-operative Diagnosis: Same  Surgeon: Lenetta QuakerBrandon R Kelci Petrella, MD  Assistants: None  Procedure Details  Patient was placed in the supine position, prepped with alcohol pad and draped in the usual sterile fashion.  We injected lidocaine jelly per urethra prior to the procedure.  We then inserted a 20 [place] coude catheter per urethra which easily passed into the bladder without any resistance at the prostatic urethra.  We achieved return of clear yellow urine and then proceeded to insert 10 mL of sterile water into the Foley balloon.  The catheter was attached to a drainage bag and secured with a StatLock.  Placement of the catheter had return of greater than 1000 mL of amber urine.               Complications: None; patient tolerated the procedure well.  ------  Assessment:  84 y.o. male with urinary rentention, likely secondary to BPH w/ recent AMS   Recommendations: - Continue Foley catheter to drainage for 7 days due to greater than 1000 mL of urine returned with catheter placement and mild bladder stretch injury - Please do not hesitate to contact us if patient discharges prior to then and will need outpatient TOV  Thank you for this consult. Please contact the urology consult pager with any further questions/concerns.

## 2018-05-30 NOTE — Progress Notes (Signed)
Pt. has remained sedated/lethargic for entire shift. Did open his eyes once when bath was given. Unable to answer questions, eat or drink any fluids. MD has been notified. Respirations non labored, HOB elevated, turning q 2 hours.

## 2018-05-30 NOTE — Progress Notes (Signed)
Pt continues to be restless, fidgeting in bed with mitts, clothing, legs out over foot of bed, trying to get the shovel from over beside the cabinet. Arguing about anything I try to assist him with. Amion text sent to inform  [facility].

## 2018-05-31 LAB — CBC
HEMATOCRIT: 36.1 % — AB (ref 39.0–52.0)
HEMOGLOBIN: 12.1 g/dL — AB (ref 13.0–17.0)
MCH: 31 pg (ref 26.0–34.0)
MCHC: 33.5 g/dL (ref 30.0–36.0)
MCV: 92.6 fL (ref 78.0–100.0)
Platelets: 401 10*3/uL — ABNORMAL HIGH (ref 150–400)
RBC: 3.9 MIL/uL — ABNORMAL LOW (ref 4.22–5.81)
RDW: 14.3 % (ref 11.5–15.5)
WBC: 12 10*3/uL — AB (ref 4.0–10.5)

## 2018-05-31 LAB — BASIC METABOLIC PANEL
ANION GAP: 11 (ref 5–15)
BUN: 23 mg/dL (ref 8–23)
CALCIUM: 8.1 mg/dL — AB (ref 8.9–10.3)
CHLORIDE: 108 mmol/L (ref 98–111)
CO2: 21 mmol/L — AB (ref 22–32)
Creatinine, Ser: 0.83 mg/dL (ref 0.61–1.24)
GFR calc non Af Amer: >60 mL/min (ref >60)
GLUCOSE: 91 mg/dL (ref 70–99)
Potassium: 3.4 mmol/L — ABNORMAL LOW (ref 3.5–5.1)
Sodium: 140 mmol/L (ref 135–145)

## 2018-05-31 NOTE — Care Management Important Message (Signed)
Important Message  Patient Details  Name: Justin [name] MRN: [id number] Date of Birth: [birth year]   Medicare Important Message Given:  Yes    Caren MacadamFuller, Presli Fanguy 05/31/2018, 10:53 AM

## 2018-05-31 NOTE — Progress Notes (Signed)
Pt woke up approx 0200 and was able to answer ?s with a slow responses, requesting water to drink, remains drowsy but cooperative. Will continue to monitor

## 2018-05-31 NOTE — Clinical Social Work Note (Signed)
Clinical Social Work Assessment  Patient Details  Name: Justin BeckerJames A Senne MRN: [id number] Date of Birth: May 28, 1928  Date of referral:  05/31/18               Reason for consult:  Discharge Planning, Facility Placement                Permission sought to share information with:    Permission granted to share information::     Name::        Agency::     Relationship::     Contact Information:     Housing/Transportation Living arrangements for the past 2 months:  Single Family Home Source of Information:  Spouse Patient Interpreter Needed:  None Criminal Activity/Legal Involvement Pertinent to Current Situation/Hospitalization:    Significant Relationships:  Spouse Lives with:  Self, Spouse Do you feel safe going back to the place where you live?  No Need for family participation in patient care:  Yes (Comment)  Care giving concerns:  Patients spouse, Claris GowerCharlotte, does not believe patient will be agreeable to go to SNF at time of discharge- patient not oriented.    Social Worker assessment / plan:  CSW spoke with patients spouse, Claris GowerCharlotte, regarding disposition plans. PT is currently recommending SNF placement at time of discharge. Spouse thinks this would be best for patient to regain his strength before returning home. Spouse does not think patient will be agreeable to this plan and is unsure if she can convince him to go. Spouse is agreeable to SNF placement at this time(if spouse agrees) and is agreeable for patients information to be faxed out. Patient/ spouse [place] [address] and would prefer facility close by. CSW will complete all needed information for SNF placement.   Employment status:  Retired Health and safety inspectornsurance information:  Medicare PT Recommendations:  Skilled Nursing Facility Information / Referral to community resources:  Skilled Nursing Facility  Patient/Family's Response to care:  Patients spouse appreciated CSW.   Patient/Family's Understanding of and Emotional  Response to Diagnosis, Current Treatment, and Prognosis:  Spouse understands current disposition plans.   Emotional Assessment Appearance:    Attitude/Demeanor/Rapport:    Affect (typically observed):  Unable to Assess Orientation:    Alcohol / Substance use:    Psych involvement (Current and /or in the community):  No (Comment)  Discharge Needs  Concerns to be addressed:  No discharge needs identified Readmission within the last 30 days:  No Current discharge risk:  None Barriers to Discharge:  No SNF bed   Donnie Coffinrin M Davaun Quintela, LCSW 05/31/2018, 10:42 AM

## 2018-05-31 NOTE — NC FL2 (Signed)
[place] MEDICAID FL2 LEVEL OF CARE SCREENING TOOL     IDENTIFICATION  Patient Name: Justin [name] Birthdate: 10-Oct-1927 Sex: male Admission Date (Current Location): 05/28/2018  [facility] and [place] Number:  Producer, television/film/video and Address:  [facility],  [address], [place] [address]      Provider Number: [id number]  Attending Physician Name and Address:  Cathren Harsh, MD  Relative Name and Phone Number:       Current Level of Care: Hospital Recommended Level of Care: Skilled Nursing Facility Prior Approval Number:    Date Approved/Denied:   PASRR Number:   [id number]  Discharge Plan: SNF    Current Diagnoses: Patient Active Problem List   Diagnosis Date Noted  . Generalized weakness   . Venous stasis ulcer (HCC) 05/28/2018  . Cellulitis of multiple sites of lower extremity 05/28/2018  . Erysipelas 09/30/2012  . DVT of lower extremity (deep venous thrombosis) (HCC) 09/28/2012  . Hematuria 09/26/2012  . Cellulitis 09/25/2012  . Venous stasis dermatitis 09/25/2012    Orientation RESPIRATION BLADDER Height & Weight        Normal Continent Weight: 165 lb (74.8 kg) Height:  5\' 10"  (177.8 cm)  BEHAVIORAL SYMPTOMS/MOOD NEUROLOGICAL BOWEL NUTRITION STATUS        Diet(Regular )  AMBULATORY STATUS COMMUNICATION OF NEEDS Skin   Extensive Assist Verbally Other (Comment)(Pressure injury-stage 2 buttocks, Venous stasis ulcer- lower left leg, right leg. )                       Personal Care Assistance Level of Assistance  Bathing, Feeding, Dressing Bathing Assistance: Limited assistance Feeding assistance: Limited assistance Dressing Assistance: Limited assistance     Functional Limitations Info             SPECIAL CARE FACTORS FREQUENCY  PT (By licensed PT), OT (By licensed OT)     PT Frequency: 5 OT Frequency: 5            Contractures      Additional Factors Info  Code Status, Allergies Code Status Info: DNR   Allergies Info: NKA            Current Medications (05/31/2018):  This is the current hospital active medication list Current Facility-Administered Medications  Medication Dose Route Frequency Provider Last Rate Last Dose  . 0.9 %  sodium chloride infusion   Intravenous Continuous Rai, Ripudeep K, MD 75 mL/hr at 05/31/18 0007    . acetaminophen (TYLENOL) tablet 325-650 mg  325-650 mg Oral Q6H PRN Delano Metz, MD      . ceFAZolin (ANCEF) IVPB 1 g/50 mL premix  1 g Intravenous Q8H Rhetta Mura, MD 100 mL/hr at 05/31/18 0611 1 g at 05/31/18 0611  . enoxaparin (LOVENOX) injection 40 mg  40 mg Subcutaneous Q24H Rhetta Mura, MD   40 mg at 05/31/18 0936  . haloperidol lactate (HALDOL) injection 1 mg  1 mg Intravenous Q6H PRN Rhetta Mura, MD   1 mg at 05/30/18 0202  . ibuprofen (ADVIL,MOTRIN) tablet 400 mg  400 mg Oral Q6H PRN Delano Metz, MD   400 mg at 05/30/18 0321  . MEDLINE mouth rinse  15 mL Mouth Rinse BID Rai, Ripudeep K, MD   15 mL at 05/31/18 0938  . sodium chloride flush (NS) 0.9 % injection 3 mL  3 mL Intravenous Q12H Samtani, Jai-Gurmukh, MD      . tamsulosin (FLOMAX) capsule 0.4 mg  0.4 mg Oral  Daily Rai, Ripudeep K, MD   0.4 mg at 05/31/18 [id number]     Discharge Medications: Please see discharge summary for a list of discharge medications.  Relevant Imaging Results:  Relevant Lab Results:   Additional Information [id number]  Donnie CoffinErin M Yarely Bebee, LCSW

## 2018-05-31 NOTE — Care Management Note (Signed)
Case Management Note  Patient Details  Name: Justin [name] MRN: [id number] Date of Birth: [birth year]  Subjective/Objective:                  dvt Bilateral lower extremity stasis ulcers with sepsis confusionactue urinary retention Action/Plan: Plan is for patient to go to snf. Will follow for progression of care and clinical status. Will follow for case management needs none present at this time.Expected Discharge Date:  (unknown)               Expected Discharge Plan:     In-House Referral:     Discharge planning Services     Post Acute Care Choice:    Choice offered to:     DME Arranged:    DME Agency:     HH Arranged:    HH Agency:     Status of Service:     If discussed at [employer] of [employer] Meetings, dates discussed:    Additional Comments:  Golda AcreDavis, Rhonda Lynn, RN 05/31/2018, 1:05 PM

## 2018-05-31 NOTE — Progress Notes (Addendum)
[facility]                                                                              Patient Demographics  Justin [name], is a 84 y.o. male, DOB - Sep 29, 1927, ZOX:[id number]  Admit date - 05/28/2018   Admitting Physician Rhetta Mura, MD  Outpatient Primary MD for the patient is Patient, No Pcp Per  Outpatient specialists:   LOS - 3  days   Medical records reviewed and are as summarized below:    Chief Complaint  Patient presents with  . Weakness       Brief summary   Patient is a 84 year old male with history of DVT, bilateral venous stasis presented to ED with cellulitis of lower extremities..  Patient had a fall and was unable to get up because of his weakness in the legs and inability to eat over the past couple of days.  Patient reported that the wound on his legs started after scratching his leg with the car door about 6 months ago and has been worsening since then.    Assessment & Plan   Bilateral lower extremity stasis ulcers with sepsis -Prior to admission with left lower extremity cellulitis, erysipelas with the left lower extremity DVT in 2014, marked skin changes with ulcerated areas on the left lower leg> right -ABI's indicate possible calcification of the vessels with normal Doppler waveforms, TBI normal bilaterally -Continue wound care, IV Ancef and pain control, will transition to oral antibiotics at the time of discharge.   Acute metabolic encephalopathy -Much more alert and oriented, appears to be at his baseline today -Avoid Ativan, had exaggerated response to both Haldol and Ativan  Acute urine retention likely due to BPH and altered mental status -On examination, lower abdominal fullness, had issues with urinary retention overnight -Bladder scan showed about 800 cc -Urology consulted, 59 French coud catheter placed with return of urine -UA negative for UTI on 9/13  Dehydration with failure to thrive -Continue  gentle IV fluid hydration  Falls PT OT evaluation pending  Code Status: dnr  DVT Prophylaxis: Lovenox Family Communication: Discussed in detail with the patient, all imaging results, lab results explained to the patient's wife on the phone   Disposition Plan: when SNF bed available  Time Spent in minutes70mins  Procedures:    Consultants:   urology  Antimicrobials:      Medications  Scheduled Meds: . enoxaparin (LOVENOX) injection  40 mg Subcutaneous Q24H  . mouth rinse  15 mL Mouth Rinse BID  . sodium chloride flush  3 mL Intravenous Q12H  . tamsulosin  0.4 mg Oral Daily   Continuous Infusions: . sodium chloride 75 mL/hr at 05/31/18 0007  .  ceFAZolin (ANCEF) IV 1 g (05/31/18 0611)   PRN Meds:.acetaminophen, haloperidol lactate, ibuprofen   Antibiotics   Anti-infectives (From admission, onward)   Start     Dose/Rate Route Frequency Ordered Stop   05/28/18 1500  ceFAZolin (ANCEF) IVPB 1 g/50 mL premix     1 g 100 mL/hr over 30 Minutes Intravenous Every 8 hours 05/28/18 1334     05/28/18 0615  ceFAZolin (ANCEF) IVPB  1 g/50 mL premix     1 g 100 mL/hr over 30 Minutes Intravenous  Once 05/28/18 [id number] 05/28/18 [id number]        Subjective:   Justin [name] was seen and examined today.  Feels a lot better today, alert and oriented.  Denies any specific complaints this morning.  No nausea, vomiting, fevers or chills, abdominal pain.    Objective:   Vitals:   05/30/18 0420 05/30/18 1544 05/30/18 2042 05/31/18 0354  BP: 127/66 (!) 143/78 (!) 153/81 (!) 145/78  Pulse: 98 93 98 89  Resp: 20 16 20 18   Temp: 99 F (37.2 C)  98.9 F (37.2 C) 99.1 F (37.3 C)  TempSrc: Oral     SpO2:  94% 93% 94%  Weight:      Height:        Intake/Output Summary (Last 24 hours) at 05/31/2018 1229 Last data filed at 05/31/2018 0923 Gross per 24 hour  Intake 580 ml  Output 1500 ml  Net -920 ml     Wt Readings from Last 3 Encounters:  05/28/18 74.8 kg  09/25/12 73.8 kg    09/25/12 72.6 kg     Exam    General: Alert and oriented x 3, NAD  Eyes:   HEENT:   Cardiovascular: S1 S2 auscultated,  Regular rate and rhythm. No pedal edema b/l  Respiratory: Clear to auscultation bilaterally, no wheezing, rales or rhonchi  Gastrointestinal: Soft, nontender, nondistended, + bowel sounds  Ext: lower extremity dressing intact  Neuro:   Musculoskeletal: No digital cyanosis, clubbing  Skin: No rashes  Psych: Normal affect and demeanor, alert and oriented x3   Data Reviewed:  I have personally reviewed following labs and imaging studies  Micro Results No results found for this or any previous visit (from the past 240 hour(s)).  Radiology Reports Ct Head Wo Contrast  Result Date: 05/28/2018 CLINICAL DATA:  Visual loss or uveitis/scleritis EXAM: CT HEAD WITHOUT CONTRAST TECHNIQUE: Contiguous axial images were obtained from the base of the skull through the vertex without intravenous contrast. COMPARISON:  Remote head CT 06/02/2005 FINDINGS: Brain: Progressive atrophy and chronic small vessel ischemia from 2006 exam. No intracranial hemorrhage, mass effect, or midline shift. No hydrocephalus. The basilar cisterns are patent. No evidence of territorial infarct or acute ischemia. No extra-axial or intracranial fluid collection. Vascular: No hyperdense vessel. Skull: No fracture or focal lesion. Sinuses/Orbits: Paranasal sinuses and mastoid air cells are clear. The visualized orbits are unremarkable. Other: None. IMPRESSION: 1.  No acute intracranial abnormality. 2. Atrophy with moderate to advanced chronic small vessel ischemia. Electronically Signed   By: Narda RutherfordMelanie  Sanford M.D.   On: 05/28/2018 03:53    Lab Data:  CBC: Recent Labs  Lab 05/28/18 0301 05/28/18 1014 05/29/18 0519 05/30/18 0524 05/31/18 0528  WBC 16.4* 15.8* 14.8* 14.0* 12.0*  NEUTROABS 13.1*  --   --   --   --   HGB 13.4 12.9* 12.8* 12.3* 12.1*  HCT 38.3* 37.5* 37.3* 36.0* 36.1*  MCV  91.2 91.9 92.6 92.1 92.6  PLT 421* 388 404* 421* 401*   Basic Metabolic Panel: Recent Labs  Lab 05/28/18 0301 05/28/18 1014 05/29/18 0519 05/30/18 0524 05/31/18 0528  NA 137  --  140 141 140  K 3.9  --  3.4* 3.6 3.4*  CL 101  --  106 110 108  CO2 23  --  23 22 21*  GLUCOSE 103*  --  110* 118* 91  BUN 35*  --  35*  31* 23  CREATININE 0.89 0.97 0.82 0.95 0.83  CALCIUM 8.6*  --  8.5* 8.5* 8.1*   GFR: Estimated Creatinine Clearance: 63.5 mL/min (by C-G formula based on SCr of 0.83 mg/dL). Liver Function Tests: Recent Labs  Lab 05/28/18 0301 05/29/18 0519  AST 51* 42*  ALT 34 33  ALKPHOS 69 69  BILITOT 0.9 0.5  PROT 5.7* 5.2*  ALBUMIN 2.6* 2.3*   No results for input(s): LIPASE, AMYLASE in the last 168 hours. Recent Labs  Lab 05/29/18 0519  AMMONIA 11   Coagulation Profile: No results for input(s): INR, PROTIME in the last 168 hours. Cardiac Enzymes: Recent Labs  Lab 05/28/18 0301  CKTOTAL 980*   BNP (last 3 results) No results for input(s): PROBNP in the last 8760 hours. HbA1C: No results for input(s): HGBA1C in the last 72 hours. CBG: Recent Labs  Lab 05/28/18 1421  GLUCAP 98   Lipid Profile: No results for input(s): CHOL, HDL, LDLCALC, TRIG, CHOLHDL, LDLDIRECT in the last 72 hours. Thyroid Function Tests: No results for input(s): TSH, T4TOTAL, FREET4, T3FREE, THYROIDAB in the last 72 hours. Anemia Panel: No results for input(s): VITAMINB12, FOLATE, FERRITIN, TIBC, IRON, RETICCTPCT in the last 72 hours. Urine analysis:    Component Value Date/Time   COLORURINE YELLOW 05/28/2018 0645   APPEARANCEUR CLEAR 05/28/2018 0645   LABSPEC 1.024 05/28/2018 0645   PHURINE 5.0 05/28/2018 0645   GLUCOSEU NEGATIVE 05/28/2018 0645   HGBUR NEGATIVE 05/28/2018 0645   BILIRUBINUR NEGATIVE 05/28/2018 0645   KETONESUR 80 (A) 05/28/2018 0645   PROTEINUR 30 (A) 05/28/2018 0645   UROBILINOGEN 0.2 09/26/2012 0034   NITRITE NEGATIVE 05/28/2018 0645   LEUKOCYTESUR  NEGATIVE 05/28/2018 0645     Evalisse Prajapati M.D. [facility] 05/31/2018, 12:29 PM  Pager: [phone] Between 7am to 7pm - call Pager - [phone]  After 7pm go to [email or web address] - password [email or web address]  Call night coverage person covering after 7pm

## 2018-06-01 MED ORDER — ENSURE ENLIVE PO LIQD: 237.0000 mL | Freq: Two times a day (BID) | ORAL | Status: DC

## 2018-06-01 NOTE — Progress Notes (Signed)
Initial Nutrition Assessment  INTERVENTION:   Provide Ensure Enlive po BID, each supplement provides 350 kcal and 20 grams of protein  NUTRITION DIAGNOSIS:   Inadequate oral intake related to inability to eat as evidenced by per patient/family report.  GOAL:   Patient will meet greater than or equal to 90% of their needs  MONITOR:   Supplement acceptance, PO intake, Weight trends, Labs, I & O's, Skin  REASON FOR ASSESSMENT:   Malnutrition Screening Tool    ASSESSMENT:   84 year old male with history of DVT, bilateral venous stasis presented to ED with cellulitis of lower extremities..  Patient had a fall and was unable to get up because of his weakness in the legs and inability to eat over the past couple of days.  Patient with poor intakes of breakfast this morning. Pt was unable to eat PTA d/t a fall and his inability to get up to get food. Conversation with patient was cut short d/t patient receiving a phone call. RD left the room so patient could continue his phone conversation. Will attempt to gather further nutrition information at follow-up.  Per PO documentation, pt has consumed 10-30% of meals this admission.  No recent weights in chart.   Medications reviewed. Labs reviewed: Low K   NUTRITION - FOCUSED PHYSICAL EXAM:  Partial completed.    Most Recent Value  Orbital Region  No depletion  Upper Arm Region  Unable to assess  Thoracic and Lumbar Region  Unable to assess  Buccal Region  Unable to assess  Temple Region  Moderate depletion  Clavicle Bone Region  Moderate depletion  Clavicle and Acromion Bone Region  Moderate depletion  Scapular Bone Region  Unable to assess  Dorsal Hand  Mild depletion  Patellar Region  Unable to assess  Anterior Thigh Region  Unable to assess  Posterior Calf Region  Unable to assess  Edema (RD Assessment)  Unable to assess       Diet Order:   Diet Order            Diet regular Room service appropriate? Yes; Fluid  consistency: Thin  Diet effective now              EDUCATION NEEDS:   Not appropriate for education at this time  Skin:  Skin Assessment: Skin Integrity Issues: Skin Integrity Issues:: Stage II, Other (Comment) Stage II: buttocks Other: Venous stasis ulcers on LEs  Last BM:  9/13  Height:   Ht Readings from Last 1 Encounters:  05/28/18 5\' 10"  (1.778 m)    Weight:   Wt Readings from Last 1 Encounters:  05/28/18 74.8 kg    Ideal Body Weight:  75.5 kg  BMI:  Body mass index is 23.68 kg/m.  Estimated Nutritional Needs:   Kcal:  1850-2050  Protein:  85-95g  Fluid:  2L/day  Tilda FrancoLindsey Falecia Vannatter, MS, RD, LDN [facility] Clinical Dietitian Pager: [phone] After Hours Pager: [phone]

## 2018-06-01 NOTE — Progress Notes (Signed)
CSW spoke with patient via bedside to discuss disposition plans. At this time, patient is refusing SNF placement at time of discharge due to wanting to get home and "having bills to pay". CSW notified RN and MD.   CSW signing off at this time.   Stacy GardnerErin Brentlee Sciara, LCSW Clinical Social Worker  [facility]  [phone]

## 2018-06-01 NOTE — Progress Notes (Signed)
[facility]                                                                              Patient Demographics  Justin [name], is a 84 y.o. male, DOB - [birth year], WJX:[id number]  Admit date - 05/28/2018   Admitting Physician Rhetta MuraJai-Gurmukh Samtani, MD  Outpatient Primary MD for the patient is Patient, No Pcp Per  Outpatient specialists:   LOS - 4  days   Medical records reviewed and are as summarized below:    Chief Complaint  Patient presents with  . Weakness       Brief summary   Patient is a 84 year old male with history of DVT, bilateral venous stasis presented to ED with cellulitis of lower extremities..  Patient had a fall and was unable to get up because of his weakness in the legs and inability to eat over the past couple of days.  Patient reported that the wound on his legs started after scratching his leg with the car door about 6 months ago and has been worsening since then.    Assessment & Plan   Bilateral lower extremity stasis ulcers with sepsis -Prior to admission with left lower extremity cellulitis, erysipelas with the left lower extremity DVT in 2014, marked skin changes with ulcerated areas on the left lower leg> right -ABI's indicate possible calcification of the vessels with normal Doppler waveforms, TBI normal bilaterally -Continue wound care and dressing changes  -Continue IV Ancef, pain control  -Recommended patient will likely benefit from skilled nursing facility for proper wound care and antibiotics.     Acute metabolic encephalopathy -Alert and oriented, at his baseline -Avoid Ativan, had exaggerated response to both Haldol and Ativan  Acute urine retention likely due to BPH and altered mental status -On examination, lower abdominal fullness, had issues with urinary retention overnight -Bladder scan showed about 800 cc -Urology was consulted, 3720 French coud catheter placed with return of 1000ml urine -UA negative for UTI on  9/13 -Continue Foley catheter for at least a week then voiding trial  Dehydration with failure to thrive -Continue gentle IV fluid hydration  Falls Patient recommended skilled nursing facility   Code Status: dnr  DVT Prophylaxis: Lovenox Family Communication: Discussed in detail with the patient, all imaging results, lab results explained to the patient's wife on the phone yesterday on 9/16   Disposition Plan:   Time Spent in minutes 25mins  Procedures:    Consultants:   urology  Antimicrobials:      Medications  Scheduled Meds: . enoxaparin (LOVENOX) injection  40 mg Subcutaneous Q24H  . feeding supplement (ENSURE ENLIVE)  237 mL Oral BID BM  . mouth rinse  15 mL Mouth Rinse BID  . sodium chloride flush  3 mL Intravenous Q12H  . tamsulosin  0.4 mg Oral Daily   Continuous Infusions: . sodium chloride 75 mL/hr at 06/01/18 0438  .  ceFAZolin (ANCEF) IV 1 g (06/01/18 1307)   PRN Meds:.acetaminophen, haloperidol lactate, ibuprofen   Antibiotics   Anti-infectives (From admission, onward)   Start     Dose/Rate Route Frequency Ordered Stop   05/28/18 1500  ceFAZolin (  ANCEF) IVPB 1 g/50 mL premix     1 g 100 mL/hr over 30 Minutes Intravenous Every 8 hours 05/28/18 1334     05/28/18 0615  ceFAZolin (ANCEF) IVPB 1 g/50 mL premix     1 g 100 mL/hr over 30 Minutes Intravenous  Once 05/28/18 9147 05/28/18 8295        Subjective:   Justin [name] was seen and examined today.  No new complaints, alert and oriented.  No fevers.  No nausea vomiting or abdominal pain.    Objective:   Vitals:   05/31/18 1423 05/31/18 2015 06/01/18 0511 06/01/18 0812  BP: 128/76 133/81 (!) 158/88   Pulse: 86 79 83   Resp: 15 (!) 21 20   Temp: 99 F (37.2 C) (!) 97.5 F (36.4 C) 97.9 F (36.6 C)   TempSrc: Oral     SpO2: 92% 94% 96% 92%  Weight:      Height:        Intake/Output Summary (Last 24 hours) at 06/01/2018 1340 Last data filed at 05/31/2018 2011 Gross per 24 hour    Intake 240 ml  Output 600 ml  Net -360 ml     Wt Readings from Last 3 Encounters:  05/28/18 74.8 kg  09/25/12 73.8 kg  09/25/12 72.6 kg     Exam     General: Alert and oriented x 3, NAD  Eyes:  HEENT:    Cardiovascular: S1 S2 auscultated,  Regular rate and rhythm. No pedal edema b/l  Respiratory: Clear to auscultation bilaterally, no wheezing, rales or rhonchi  Gastrointestinal: Soft, nontender, nondistended, + bowel sounds  Ext: bilateral lower extremity with weeping areas on both legs L>R  Neuro: no new FND's  Musculoskeletal: No digital cyanosis, clubbing  Skin: No rashes  Psych: Normal affect and demeanor, alert and oriented x3   Data Reviewed:  I have personally reviewed following labs and imaging studies  Micro Results No results found for this or any previous visit (from the past 240 hour(s)).  Radiology Reports Ct Head Wo Contrast  Result Date: 05/28/2018 CLINICAL DATA:  Visual loss or uveitis/scleritis EXAM: CT HEAD WITHOUT CONTRAST TECHNIQUE: Contiguous axial images were obtained from the base of the skull through the vertex without intravenous contrast. COMPARISON:  Remote head CT 06/02/2005 FINDINGS: Brain: Progressive atrophy and chronic small vessel ischemia from 2006 exam. No intracranial hemorrhage, mass effect, or midline shift. No hydrocephalus. The basilar cisterns are patent. No evidence of territorial infarct or acute ischemia. No extra-axial or intracranial fluid collection. Vascular: No hyperdense vessel. Skull: No fracture or focal lesion. Sinuses/Orbits: Paranasal sinuses and mastoid air cells are clear. The visualized orbits are unremarkable. Other: None. IMPRESSION: 1.  No acute intracranial abnormality. 2. Atrophy with moderate to advanced chronic small vessel ischemia. Electronically Signed   By: Narda Rutherford M.D.   On: 05/28/2018 03:53    Lab Data:  CBC: Recent Labs  Lab 05/28/18 0301 05/28/18 1014 05/29/18 0519  05/30/18 0524 05/31/18 0528  WBC 16.4* 15.8* 14.8* 14.0* 12.0*  NEUTROABS 13.1*  --   --   --   --   HGB 13.4 12.9* 12.8* 12.3* 12.1*  HCT 38.3* 37.5* 37.3* 36.0* 36.1*  MCV 91.2 91.9 92.6 92.1 92.6  PLT 421* 388 404* 421* 401*   Basic Metabolic Panel: Recent Labs  Lab 05/28/18 0301 05/28/18 1014 05/29/18 0519 05/30/18 0524 05/31/18 0528  NA 137  --  140 141 140  K 3.9  --  3.4* 3.6 3.4*  CL 101  --  106 110 108  CO2 23  --  23 22 21*  GLUCOSE 103*  --  110* 118* 91  BUN 35*  --  35* 31* 23  CREATININE 0.89 0.97 0.82 0.95 0.83  CALCIUM 8.6*  --  8.5* 8.5* 8.1*   GFR: Estimated Creatinine Clearance: 63.5 mL/min (by C-G formula based on SCr of 0.83 mg/dL). Liver Function Tests: Recent Labs  Lab 05/28/18 0301 05/29/18 0519  AST 51* 42*  ALT 34 33  ALKPHOS 69 69  BILITOT 0.9 0.5  PROT 5.7* 5.2*  ALBUMIN 2.6* 2.3*   No results for input(s): LIPASE, AMYLASE in the last 168 hours. Recent Labs  Lab 05/29/18 0519  AMMONIA 11   Coagulation Profile: No results for input(s): INR, PROTIME in the last 168 hours. Cardiac Enzymes: Recent Labs  Lab 05/28/18 0301  CKTOTAL 980*   BNP (last 3 results) No results for input(s): PROBNP in the last 8760 hours. HbA1C: No results for input(s): HGBA1C in the last 72 hours. CBG: Recent Labs  Lab 05/28/18 1421  GLUCAP 98   Lipid Profile: No results for input(s): CHOL, HDL, LDLCALC, TRIG, CHOLHDL, LDLDIRECT in the last 72 hours. Thyroid Function Tests: No results for input(s): TSH, T4TOTAL, FREET4, T3FREE, THYROIDAB in the last 72 hours. Anemia Panel: No results for input(s): VITAMINB12, FOLATE, FERRITIN, TIBC, IRON, RETICCTPCT in the last 72 hours. Urine analysis:    Component Value Date/Time   COLORURINE YELLOW 05/28/2018 0645   APPEARANCEUR CLEAR 05/28/2018 0645   LABSPEC 1.024 05/28/2018 0645   PHURINE 5.0 05/28/2018 0645   GLUCOSEU NEGATIVE 05/28/2018 0645   HGBUR NEGATIVE 05/28/2018 0645   BILIRUBINUR NEGATIVE  05/28/2018 0645   KETONESUR 80 (A) 05/28/2018 0645   PROTEINUR 30 (A) 05/28/2018 0645   UROBILINOGEN 0.2 09/26/2012 0034   NITRITE NEGATIVE 05/28/2018 0645   LEUKOCYTESUR NEGATIVE 05/28/2018 0645     Ripudeep Rai M.D. [facility] 06/01/2018, 1:40 PM  Pager: [phone] Between 7am to 7pm - call Pager - [phone]  After 7pm go to [email or web address] - password [email or web address]  Call night coverage person covering after 7pm

## 2018-06-01 NOTE — Care Management Important Message (Signed)
Important Message  Patient Details  Name: Justin [name] MRN: [id number] Date of Birth: 05-Sep-1928   Medicare Important Message Given:  Yes    Caren MacadamFuller, Mixtli Reno 06/01/2018, 11:31 AMImportant Message  Patient Details  Name: Justin [name] MRN: [id number] Date of Birth: 05-Sep-1928   Medicare Important Message Given:  Yes    Caren MacadamFuller, Laurent Cargile 06/01/2018, 11:30 AM

## 2018-06-02 LAB — BASIC METABOLIC PANEL
Anion gap: 9 (ref 5–15)
BUN: 16 mg/dL (ref 8–23)
CHLORIDE: 106 mmol/L (ref 98–111)
CO2: 23 mmol/L (ref 22–32)
CREATININE: 0.67 mg/dL (ref 0.61–1.24)
Calcium: 7.9 mg/dL — ABNORMAL LOW (ref 8.9–10.3)
GFR calc Af Amer: >60 mL/min (ref >60)
GFR calc non Af Amer: >60 mL/min (ref >60)
GLUCOSE: 96 mg/dL (ref 70–99)
POTASSIUM: 3.3 mmol/L — AB (ref 3.5–5.1)
SODIUM: 138 mmol/L (ref 135–145)

## 2018-06-02 MED ORDER — TAMSULOSIN HCL 0.4 MG PO CAPS: 0.4000 mg | ORAL_CAPSULE | Freq: Every day | ORAL | 0 refills | Status: AC

## 2018-06-02 MED ORDER — POTASSIUM CHLORIDE CRYS ER 20 MEQ PO TBCR
40.0000 meq | EXTENDED_RELEASE_TABLET | Freq: Once | ORAL | Status: AC
  Administered 2018-06-02: 40 meq via ORAL
  Filled 2018-06-02: qty 2

## 2018-06-02 MED ORDER — CEPHALEXIN 500 MG PO CAPS
500.0000 mg | ORAL_CAPSULE | Freq: Four times a day (QID) | ORAL | Status: DC
  Administered 2018-06-02: 500 mg via ORAL
  Filled 2018-06-02: qty 1

## 2018-06-02 MED ORDER — CEPHALEXIN 500 MG PO CAPS: 500.0000 mg | ORAL_CAPSULE | Freq: Three times a day (TID) | ORAL | 0 refills | Status: AC

## 2018-06-02 NOTE — Care Management Note (Signed)
Case Management Note  Patient Details  Name: Justin [name] MRN: [id number] Date of Birth: 03/06/1928  Subjective/Objective:                  Discharged to home- patient refused all dme and hhc offered.  Transport to home through [facility]-form needed filed and [facility] notified of dc  Action/Plan: No cm needs present other than the above. Expected Discharge Date:  06/02/18               Expected Discharge Plan:  Home/Self Care  In-House Referral:     Discharge planning Services  CM Consult  Post Acute Care Choice:    Choice offered to:     DME Arranged:    DME Agency:     HH Arranged:    HH Agency:     Status of Service:  Completed, signed off  If discussed at [employer] Length [employer] Stay Meetings, dates discussed:    Additional Comments:  Golda AcreDavis, Justin [name] Lynn, RN 06/02/2018, 1:53 PM

## 2018-06-02 NOTE — Discharge Summary (Signed)
Physician Discharge Summary  Justin [name] VWU:[id number] DOB: 09-04-1928 DOA: 05/28/2018  PCP: Patient, No Pcp Per  Admit date: 05/28/2018 Discharge date: 06/02/2018  Admitted From: Home Disposition: Home  Recommendations for Outpatient Follow-up:  1. Follow up with PCP in 1-2 weeks 2. Please obtain BMP/CBC in one week your next doctors visit.  3. Follow-up with urology in 1 week for a trial of Foley removal 4. Keflex and Flomax has been prescribed.  Home Health: None Equipment/Devices: Foley catheter Discharge Condition: Stable CODE STATUS: DO NOT RESUSCITATE Diet recommendation: Regular  Brief/Interim Summary: 84 year old male with history of DVT treated with Xarelto, bilateral venous stasis came to the hospital with complains of cellulitis of the lower extremity.  Apparently patient had been feeling weak and had sustained a fall.  Since then he had not been able to walk so well.  He was found to be septic secondary to bilateral lower extremity stasis ulcer/cellulitis.  He was started on IV Ancef with wound care/local dressing and pain control.  Over the course of few days his symptoms started improving significantly.  He was evaluated by physical therapy who recommended skilled nursing facility but patient refused any also refused any sort of home health. On the day of discharge I spoke with the patient's wife Justin [name] over the phone regarding his care including his lower extremity infection, Foley catheter and follow-up.  She agrees that the patient would refuse any sort of home health and skilled nursing facility as he is " stubborn" according to her. At this time patient is medically stable and has reached maximum benefit from this hospital stay and stable to be discharged with above recommendations as stated.   Discharge Diagnoses:  Active Problems:   Venous stasis ulcer (HCC)   Cellulitis of multiple sites of lower extremity   Generalized weakness  Bilateral lower extremity  stasis ulcer with sepsis, improved - ABIs indicated possible calcification of the vessels with normal Dopplers.  TBI normally bilaterally.  We will switch IV Ancef to Keflex orally.  Recommend local wound care at home.  Patient does not want any sort of home health at home and does not want to go to skilled nursing facility.  Acute metabolic encephalopathy, resolved - This is likely delirium in the setting of the hospital.  Acute urinary retention likely secondary to BPH - Urology was consulted and Foley catheter/coud was placed.  UA is negative.  I have advised the patient and the wife that he needs a follow-up appointment in 1 week for removal trial of his catheter.  Urology information is in the discharge paperwork.  Generalized weakness with multiple falls -Physical therapy recommends skilled nursing facility but patient is refusing skilled nursing facility and home health despite of me explaining the benefits of it.  Wife is aware of this.  CODE STATUS is DNR/DNI Was on Lovenox for DVT prophylaxis Spoke with the patient's wife over the phone on the day of discharge.  Discharge Instructions   Allergies as of 06/02/2018   No Known Allergies     Medication List    STOP taking these medications   doxycycline 100 MG tablet Commonly known as:  VIBRA-TABS   oxyCODONE 5 MG immediate release tablet Commonly known as:  Oxy IR/ROXICODONE   Rivaroxaban 15 MG Tabs tablet Commonly known as:  XARELTO   rivaroxaban 20 MG Tabs tablet Commonly known as:  XARELTO     TAKE these medications   cephALEXin 500 MG capsule Commonly known as:  KEFLEX Take  1 capsule (500 mg total) by mouth 3 (three) times daily for 7 days. What changed:  when to take this   tamsulosin 0.4 MG Caps capsule Commonly known as:  FLOMAX Take 1 capsule (0.4 mg total) by mouth daily. Start taking on:  06/03/2018      Follow-up Information    Crist Fat, MD Follow up in 1 week(s).   Specialty:   Urology Why:  If catheter needs to be removed through Urology office, please call and make appointment. Contact information: [address] [place] [address] [phone]        [facility]. Schedule an appointment as soon as possible for a visit in 1 week(s).   Contact information: [address] [place] [address] [phone]         No Known Allergies  You were cared for by a hospitalist during your hospital stay. If you have any questions about your discharge medications or the care you received while you were in the hospital after you are discharged, you can call the unit and asked to speak with the hospitalist on call if the hospitalist that took care of you is not available. Once you are discharged, your primary care physician will handle any further medical issues. Please note that no refills for any discharge medications will be authorized once you are discharged, as it is imperative that you return to your primary care physician (or establish a relationship with a primary care physician if you do not have one) for your aftercare needs so that they can reassess your need for medications and monitor your lab values.  Consultations:  Urology   Procedures/Studies: Ct Head Wo Contrast  Result Date: 05/28/2018 CLINICAL DATA:  Visual loss or uveitis/scleritis EXAM: CT HEAD WITHOUT CONTRAST TECHNIQUE: Contiguous axial images were obtained from the base of the skull through the vertex without intravenous contrast. COMPARISON:  Remote head CT 06/02/2005 FINDINGS: Brain: Progressive atrophy and chronic small vessel ischemia from 2006 exam. No intracranial hemorrhage, mass effect, or midline shift. No hydrocephalus. The basilar cisterns are patent. No evidence of territorial infarct or acute ischemia. No extra-axial or intracranial fluid collection. Vascular: No hyperdense vessel. Skull: No fracture or focal lesion. Sinuses/Orbits:  Paranasal sinuses and mastoid air cells are clear. The visualized orbits are unremarkable. Other: None. IMPRESSION: 1.  No acute intracranial abnormality. 2. Atrophy with moderate to advanced chronic small vessel ischemia. Electronically Signed   By: Narda Rutherford M.D.   On: 05/28/2018 03:53      Subjective: No complaints, wishes to go home.  General = no fevers, chills, dizziness, malaise, fatigue HEENT/EYES = negative for pain, redness, loss of vision, double vision, blurred vision, loss of hearing, sore throat, hoarseness, dysphagia Cardiovascular= negative for chest pain, palpitation, murmurs, lower extremity swelling Respiratory/lungs= negative for shortness of breath, cough, hemoptysis, wheezing, mucus production Gastrointestinal= negative for nausea, vomiting,, abdominal pain, melena, hematemesis Genitourinary= negative for Dysuria, Hematuria, Change in Urinary Frequency MSK = Negative for arthralgia, myalgias, Back Pain, Joint swelling  Neurology= Negative for headache, seizures, numbness, tingling  Psychiatry= Negative for anxiety, depression, suicidal and homocidal ideation Allergy/Immunology= Medication/Food allergy as listed  Skin= Negative for Rash, lesions, ulcers, itching   Discharge Exam: Vitals:   06/01/18 2001 06/02/18 0538  BP: (!) 141/78 (!) 144/91  Pulse: 80 78  Resp: 19 19  Temp: 98.3 F (36.8 C) 97.8 F (36.6 C)  SpO2: 96% 98%   Vitals:   06/01/18  [id number] 06/01/18 1401 06/01/18 2001 06/02/18 0538  BP:  137/77 (!) 141/78 (!) 144/91  Pulse:  94 80 78  Resp:  20 19 19   Temp:  97.7 F (36.5 C) 98.3 F (36.8 C) 97.8 F (36.6 C)  TempSrc:  Oral Oral Oral  SpO2: 92% 94% 96% 98%  Weight:      Height:        General: Pt is alert, awake, not in acute distress Cardiovascular: RRR, S1/S2 +, no rubs, no gallops Respiratory: CTA bilaterally, no wheezing, no rhonchi Abdominal: Soft, NT, ND, bowel sounds + Extremities: no edema, no cyanosis; bilateral lower  extremity stasis ulcer noted more on the left than right-improving. Foley catheter is in place draining well.   The results of significant diagnostics from this hospitalization (including imaging, microbiology, ancillary and laboratory) are listed below for reference.     Microbiology: No results found for this or any previous visit (from the past 240 hour(s)).   Labs: BNP (last 3 results) No results for input(s): BNP in the last 8760 hours. Basic Metabolic Panel: Recent Labs  Lab 05/28/18 0301 05/28/18 1014 05/29/18 0519 05/30/18 0524 05/31/18 0528 06/02/18 0530  NA 137  --  140 141 140 138  K 3.9  --  3.4* 3.6 3.4* 3.3*  CL 101  --  106 110 108 106  CO2 23  --  23 22 21* 23  GLUCOSE 103*  --  110* 118* 91 96  BUN 35*  --  35* 31* 23 16  CREATININE 0.89 0.97 0.82 0.95 0.83 0.67  CALCIUM 8.6*  --  8.5* 8.5* 8.1* 7.9*   Liver Function Tests: Recent Labs  Lab 05/28/18 0301 05/29/18 0519  AST 51* 42*  ALT 34 33  ALKPHOS 69 69  BILITOT 0.9 0.5  PROT 5.7* 5.2*  ALBUMIN 2.6* 2.3*   No results for input(s): LIPASE, AMYLASE in the last 168 hours. Recent Labs  Lab 05/29/18 0519  AMMONIA 11   CBC: Recent Labs  Lab 05/28/18 0301 05/28/18 1014 05/29/18 0519 05/30/18 0524 05/31/18 0528  WBC 16.4* 15.8* 14.8* 14.0* 12.0*  NEUTROABS 13.1*  --   --   --   --   HGB 13.4 12.9* 12.8* 12.3* 12.1*  HCT 38.3* 37.5* 37.3* 36.0* 36.1*  MCV 91.2 91.9 92.6 92.1 92.6  PLT 421* 388 404* 421* 401*   Cardiac Enzymes: Recent Labs  Lab 05/28/18 0301  CKTOTAL 980*   BNP: Invalid input(s): POCBNP CBG: Recent Labs  Lab 05/28/18 1421  GLUCAP 98   D-Dimer No results for input(s): DDIMER in the last 72 hours. Hgb A1c No results for input(s): HGBA1C in the last 72 hours. Lipid Profile No results for input(s): CHOL, HDL, LDLCALC, TRIG, CHOLHDL, LDLDIRECT in the last 72 hours. Thyroid function studies No results for input(s): TSH, T4TOTAL, T3FREE, THYROIDAB in the last 72  hours.  Invalid input(s): FREET3 Anemia work up No results for input(s): VITAMINB12, FOLATE, FERRITIN, TIBC, IRON, RETICCTPCT in the last 72 hours. Urinalysis    Component Value Date/Time   COLORURINE YELLOW 05/28/2018 0645   APPEARANCEUR CLEAR 05/28/2018 0645   LABSPEC 1.024 05/28/2018 0645   PHURINE 5.0 05/28/2018 0645   GLUCOSEU NEGATIVE 05/28/2018 0645   HGBUR NEGATIVE 05/28/2018 0645   BILIRUBINUR NEGATIVE 05/28/2018 0645   KETONESUR 80 (A) 05/28/2018 0645   PROTEINUR 30 (A) 05/28/2018 0645   UROBILINOGEN 0.2 09/26/2012 0034   NITRITE NEGATIVE 05/28/2018 0645   LEUKOCYTESUR NEGATIVE 05/28/2018 0645   Sepsis Labs  Invalid input(s): PROCALCITONIN,  WBC,  LACTICIDVEN Microbiology No results found for this or any previous visit (from the past 240 hour(s)).   Time coordinating discharge:  I have spent 35 minutes face to face with the patient and on the ward discussing the patients care, assessment, plan and disposition with other care givers. >50% of the time was devoted counseling the patient about the risks and benefits of treatment/Discharge disposition and coordinating care.   SIGNED:   Dimple Nanas, MD  [facility] 06/02/2018, 11:42 AM Pager   If 7PM-7AM, please contact night-coverage [email or web address] Password [email or web address]

## 2018-06-02 NOTE — Progress Notes (Signed)
Physical Therapy Treatment Patient Details Name: Justin [name] MRN: [id number] DOB: 08-Mar-1928 Today's Date: 06/02/2018    History of Present Illness 84yo male who presented to the ED with generalized weakness and a fall at home, diagnosed with stasis ulcers with sepsis, non-traumatic rhabdomyolysis, and fall. PMH scarlet fever, venous stasis ulcers, hx DVT     PT Comments    Patient able to walk with walker increased distance this session, but still needing guard A and cues for safety.  Remains high fall risk and would benefit from SNF level rehab, but refusing, so recommend HHPT/aide and RW for home (though currently refusing that too).   Follow Up Recommendations  SNF;Supervision/Assistance - 24 hour(but refusing SNF so will need HHPT/aide)     Equipment Recommendations  Rolling walker with 5" wheels(refusing equipment)    Recommendations for Other Services       Precautions / Restrictions Precautions Precautions: Fall;Other (comment)    Mobility  Bed Mobility Overal bed mobility: Needs Assistance Bed Mobility: Supine to Sit     Supine to sit: Min assist        Transfers Overall transfer level: Needs assistance Equipment used: Rolling walker (2 wheeled) Transfers: Sit to/from Stand Sit to Stand: Min guard         General transfer comment: Min guard, extended time, cues for safety   Ambulation/Gait Ambulation/Gait assistance: Min assist;Min guard Gait Distance (Feet): 80 Feet Assistive device: Rolling walker (2 wheeled) Gait Pattern/deviations: Step-through pattern;Trunk flexed;Shuffle     General Gait Details: walker too far out at times, cues for proximity   Stairs             Wheelchair Mobility    Modified Rankin (Stroke Patients Only)       Balance Overall balance assessment: History of Falls;Needs assistance Sitting-balance support: Feet supported Sitting balance-Leahy Scale: Fair       Standing balance-Leahy Scale:  Poor Standing balance comment: static standing suspect with UE support, at least S needed                            Cognition Arousal/Alertness: Awake/alert Behavior During Therapy: WFL for tasks assessed/performed Overall Cognitive Status: No family/caregiver present to determine baseline cognitive functioning                                 General Comments: despite weakness from hospitalization and fall risk, pt refusing SNF and HHPT/RW      Exercises      General Comments General comments (skin integrity, edema, etc.): area R upper forearm with ecchymosis reports from recent fall.      Pertinent Vitals/Pain Pain Assessment: Faces Faces Pain Scale: Hurts little more Pain Location: anterior shin bilateral lower legs Pain Descriptors / Indicators: Aching;Other (Comment)(stinging) Pain Intervention(s): Repositioned;Limited activity within patient's tolerance;Patient requesting pain meds-RN notified    Home Living                      Prior Function            PT Goals (current goals can now be found in the care plan section) Progress towards PT goals: Progressing toward goals    Frequency    Min 3X/week      PT Plan Current plan remains appropriate    Co-evaluation  AM-PAC PT "6 Clicks" Daily Activity  Outcome Measure  Difficulty turning over in bed (including adjusting bedclothes, sheets and blankets)?: A Lot Difficulty moving from lying on back to sitting on the side of the bed? : A Lot Difficulty sitting down on and standing up from a chair with arms (e.g., wheelchair, bedside commode, etc,.)?: Unable Help needed moving to and from a bed to chair (including a wheelchair)?: Total Help needed walking in hospital room?: A Lot Help needed climbing 3-5 steps with a railing? : Total 6 Click Score: 9    End of Session Equipment Utilized During Treatment: Gait belt Activity Tolerance: Patient tolerated  treatment well Patient left: in chair;with call bell/phone within reach Nurse Communication: Patient requests pain meds PT Visit Diagnosis: Other abnormalities of gait and mobility (R26.89);History of falling (Z91.81);Muscle weakness (generalized) (M62.81)     Time: [id number] PT Time Calculation (min) (ACUTE ONLY): 34 min  Charges:  $Gait Training: 8-22 mins $Self Care/Home Management: 8-22                     Sheran Lawlessyndi Katalina Magri, PT Acute Rehabilitation Services [phone] 06/02/2018    Elray Mcgregorynthia Cloa Bushong 06/02/2018, 2:03 PM

## 2018-06-02 NOTE — Progress Notes (Signed)
Patient agreeable to return home via PTAR.

## 2018-06-02 NOTE — Progress Notes (Signed)
Went over AVS with patient.    PTAR her to transport to home at this time. Levora AngelHannah V Baya Lentz, RN

## 2018-06-07 ENCOUNTER — Inpatient Hospital Stay (HOSPITAL_COMMUNITY): Payer: Medicare Other | Admitting: Certified Registered Nurse Anesthetist

## 2018-06-07 ENCOUNTER — Encounter (HOSPITAL_COMMUNITY): Admission: EM | Disposition: E | Payer: Self-pay | Source: Home / Self Care | Attending: Neurology

## 2018-06-07 ENCOUNTER — Inpatient Hospital Stay (HOSPITAL_COMMUNITY)
Admission: EM | Admit: 2018-06-07 | Discharge: 2018-07-16 | DRG: 023 | Disposition: E | Payer: Medicare Other | Attending: Neurology | Admitting: Neurology

## 2018-06-07 ENCOUNTER — Emergency Department (HOSPITAL_COMMUNITY): Payer: Medicare Other

## 2018-06-07 ENCOUNTER — Inpatient Hospital Stay (HOSPITAL_COMMUNITY): Payer: Medicare Other

## 2018-06-07 DIAGNOSIS — R402233 Coma scale, best verbal response, inappropriate words, at hospital admission: Secondary | ICD-10-CM | POA: Diagnosis present

## 2018-06-07 DIAGNOSIS — I639 Cerebral infarction, unspecified: Secondary | ICD-10-CM

## 2018-06-07 DIAGNOSIS — I10 Essential (primary) hypertension: Secondary | ICD-10-CM | POA: Diagnosis present

## 2018-06-07 DIAGNOSIS — Z7189 Other specified counseling: Secondary | ICD-10-CM

## 2018-06-07 DIAGNOSIS — G8194 Hemiplegia, unspecified affecting left nondominant side: Secondary | ICD-10-CM | POA: Diagnosis present

## 2018-06-07 DIAGNOSIS — I469 Cardiac arrest, cause unspecified: Secondary | ICD-10-CM | POA: Diagnosis not present

## 2018-06-07 DIAGNOSIS — N400 Enlarged prostate without lower urinary tract symptoms: Secondary | ICD-10-CM | POA: Diagnosis present

## 2018-06-07 DIAGNOSIS — Z515 Encounter for palliative care: Secondary | ICD-10-CM | POA: Diagnosis present

## 2018-06-07 DIAGNOSIS — I447 Left bundle-branch block, unspecified: Secondary | ICD-10-CM | POA: Diagnosis present

## 2018-06-07 DIAGNOSIS — E440 Moderate protein-calorie malnutrition: Secondary | ICD-10-CM | POA: Diagnosis not present

## 2018-06-07 DIAGNOSIS — R29732 NIHSS score 32: Secondary | ICD-10-CM | POA: Diagnosis present

## 2018-06-07 DIAGNOSIS — R531 Weakness: Secondary | ICD-10-CM

## 2018-06-07 DIAGNOSIS — Z8249 Family history of ischemic heart disease and other diseases of the circulatory system: Secondary | ICD-10-CM

## 2018-06-07 DIAGNOSIS — I4581 Long QT syndrome: Secondary | ICD-10-CM | POA: Diagnosis present

## 2018-06-07 DIAGNOSIS — L89152 Pressure ulcer of sacral region, stage 2: Secondary | ICD-10-CM | POA: Diagnosis present

## 2018-06-07 DIAGNOSIS — J189 Pneumonia, unspecified organism: Secondary | ICD-10-CM | POA: Diagnosis not present

## 2018-06-07 DIAGNOSIS — R29723 NIHSS score 23: Secondary | ICD-10-CM | POA: Diagnosis present

## 2018-06-07 DIAGNOSIS — I959 Hypotension, unspecified: Secondary | ICD-10-CM | POA: Diagnosis present

## 2018-06-07 DIAGNOSIS — J9600 Acute respiratory failure, unspecified whether with hypoxia or hypercapnia: Secondary | ICD-10-CM

## 2018-06-07 DIAGNOSIS — I472 Ventricular tachycardia: Secondary | ICD-10-CM | POA: Diagnosis not present

## 2018-06-07 DIAGNOSIS — Z87891 Personal history of nicotine dependence: Secondary | ICD-10-CM

## 2018-06-07 DIAGNOSIS — L97919 Non-pressure chronic ulcer of unspecified part of right lower leg with unspecified severity: Secondary | ICD-10-CM | POA: Diagnosis present

## 2018-06-07 DIAGNOSIS — R29724 NIHSS score 24: Secondary | ICD-10-CM | POA: Diagnosis present

## 2018-06-07 DIAGNOSIS — J9601 Acute respiratory failure with hypoxia: Secondary | ICD-10-CM | POA: Diagnosis not present

## 2018-06-07 DIAGNOSIS — H5702 Anisocoria: Secondary | ICD-10-CM | POA: Diagnosis present

## 2018-06-07 DIAGNOSIS — I615 Nontraumatic intracerebral hemorrhage, intraventricular: Secondary | ICD-10-CM | POA: Diagnosis present

## 2018-06-07 DIAGNOSIS — R402343 Coma scale, best motor response, flexion withdrawal, at hospital admission: Secondary | ICD-10-CM | POA: Diagnosis present

## 2018-06-07 DIAGNOSIS — L97929 Non-pressure chronic ulcer of unspecified part of left lower leg with unspecified severity: Secondary | ICD-10-CM | POA: Diagnosis present

## 2018-06-07 DIAGNOSIS — J9690 Respiratory failure, unspecified, unspecified whether with hypoxia or hypercapnia: Secondary | ICD-10-CM

## 2018-06-07 DIAGNOSIS — I63411 Cerebral infarction due to embolism of right middle cerebral artery: Principal | ICD-10-CM | POA: Diagnosis present

## 2018-06-07 DIAGNOSIS — Z66 Do not resuscitate: Secondary | ICD-10-CM | POA: Diagnosis not present

## 2018-06-07 DIAGNOSIS — R4701 Aphasia: Secondary | ICD-10-CM

## 2018-06-07 DIAGNOSIS — G931 Anoxic brain damage, not elsewhere classified: Secondary | ICD-10-CM | POA: Diagnosis present

## 2018-06-07 DIAGNOSIS — R402123 Coma scale, eyes open, to pain, at hospital admission: Secondary | ICD-10-CM | POA: Diagnosis present

## 2018-06-07 DIAGNOSIS — Z86718 Personal history of other venous thrombosis and embolism: Secondary | ICD-10-CM

## 2018-06-07 DIAGNOSIS — G460 Middle cerebral artery syndrome: Secondary | ICD-10-CM | POA: Diagnosis present

## 2018-06-07 DIAGNOSIS — I878 Other specified disorders of veins: Secondary | ICD-10-CM | POA: Diagnosis present

## 2018-06-07 DIAGNOSIS — Z6824 Body mass index (BMI) 24.0-24.9, adult: Secondary | ICD-10-CM

## 2018-06-07 DIAGNOSIS — R29725 NIHSS score 25: Secondary | ICD-10-CM | POA: Diagnosis present

## 2018-06-07 DIAGNOSIS — Z79899 Other long term (current) drug therapy: Secondary | ICD-10-CM

## 2018-06-07 DIAGNOSIS — R29810 Facial weakness: Secondary | ICD-10-CM | POA: Diagnosis present

## 2018-06-07 DIAGNOSIS — I83009 Varicose veins of unspecified lower extremity with ulcer of unspecified site: Secondary | ICD-10-CM | POA: Diagnosis present

## 2018-06-07 DIAGNOSIS — L0390 Cellulitis, unspecified: Secondary | ICD-10-CM | POA: Diagnosis present

## 2018-06-07 DIAGNOSIS — Z4659 Encounter for fitting and adjustment of other gastrointestinal appliance and device: Secondary | ICD-10-CM

## 2018-06-07 DIAGNOSIS — R130 Aphagia: Secondary | ICD-10-CM | POA: Diagnosis present

## 2018-06-07 DIAGNOSIS — Z978 Presence of other specified devices: Secondary | ICD-10-CM

## 2018-06-07 HISTORY — PX: IR US GUIDE VASC ACCESS RIGHT: IMG2390

## 2018-06-07 HISTORY — PX: IR CT HEAD LTD: IMG2386

## 2018-06-07 HISTORY — PX: RADIOLOGY WITH ANESTHESIA: SHX6223

## 2018-06-07 HISTORY — PX: IR PERCUTANEOUS ART THROMBECTOMY/INFUSION INTRACRANIAL INC DIAG ANGIO: IMG6087

## 2018-06-07 LAB — COMPREHENSIVE METABOLIC PANEL
ALBUMIN: 2.9 g/dL — AB (ref 3.5–5.0)
ALK PHOS: 70 U/L (ref 38–126)
ALT: 19 U/L (ref 0–44)
AST: 24 U/L (ref 15–41)
Anion gap: 12 (ref 5–15)
BILIRUBIN TOTAL: 0.9 mg/dL (ref 0.3–1.2)
BUN: 24 mg/dL — AB (ref 8–23)
CALCIUM: 8.5 mg/dL — AB (ref 8.9–10.3)
CO2: 24 mmol/L (ref 22–32)
Chloride: 100 mmol/L (ref 98–111)
Creatinine, Ser: 0.95 mg/dL (ref 0.61–1.24)
GFR calc Af Amer: >60 mL/min (ref >60)
GFR calc non Af Amer: >60 mL/min (ref >60)
GLUCOSE: 101 mg/dL — AB (ref 70–99)
Potassium: 4.1 mmol/L (ref 3.5–5.1)
SODIUM: 136 mmol/L (ref 135–145)
TOTAL PROTEIN: 5.9 g/dL — AB (ref 6.5–8.1)

## 2018-06-07 LAB — DIFFERENTIAL
BASOS ABS: 0.1 10*3/uL (ref 0.0–0.1)
Basophils Relative: 1 %
EOS PCT: 33 %
Eosinophils Absolute: 4 10*3/uL — ABNORMAL HIGH (ref 0.0–0.7)
LYMPHS ABS: 1.2 10*3/uL (ref 0.7–4.0)
LYMPHS PCT: 10 %
MONOS PCT: 8 %
Monocytes Absolute: 1 10*3/uL (ref 0.1–1.0)
NEUTROS PCT: 48 %
Neutro Abs: 5.8 10*3/uL (ref 1.7–7.7)

## 2018-06-07 LAB — CBC
HEMATOCRIT: 35.9 % — AB (ref 39.0–52.0)
HEMOGLOBIN: 11.7 g/dL — AB (ref 13.0–17.0)
MCH: 31.3 pg (ref 26.0–34.0)
MCHC: 32.6 g/dL (ref 30.0–36.0)
MCV: 96 fL (ref 78.0–100.0)
Platelets: 495 10*3/uL — ABNORMAL HIGH (ref 150–400)
RBC: 3.74 MIL/uL — AB (ref 4.22–5.81)
RDW: 14 % (ref 11.5–15.5)
WBC: 12.1 10*3/uL — ABNORMAL HIGH (ref 4.0–10.5)

## 2018-06-07 LAB — POCT I-STAT 3, ART BLOOD GAS (G3+)
ACID-BASE DEFICIT: 2 mmol/L (ref 0.0–2.0)
BICARBONATE: 22.9 mmol/L (ref 20.0–28.0)
O2 SAT: 99 %
PCO2 ART: 35.4 mmHg (ref 32.0–48.0)
Patient temperature: 94
TCO2: 24 mmol/L (ref 22–32)
pH, Arterial: 7.407 (ref 7.350–7.450)
pO2, Arterial: 103 mmHg (ref 83.0–108.0)

## 2018-06-07 LAB — I-STAT TROPONIN, ED: Troponin i, poc: 0.26 ng/mL (ref 0.00–0.08)

## 2018-06-07 LAB — PHOSPHORUS: Phosphorus: 3.9 mg/dL (ref 2.5–4.6)

## 2018-06-07 LAB — PROTIME-INR
INR: 1.26
Prothrombin Time: 15.7 seconds — ABNORMAL HIGH (ref 11.4–15.2)

## 2018-06-07 LAB — I-STAT CHEM 8, ED
BUN: 27 mg/dL — AB (ref 8–23)
CALCIUM ION: 1.07 mmol/L — AB (ref 1.15–1.40)
CHLORIDE: 103 mmol/L (ref 98–111)
Creatinine, Ser: 0.9 mg/dL (ref 0.61–1.24)
GLUCOSE: 94 mg/dL (ref 70–99)
HCT: 36 % — ABNORMAL LOW (ref 39.0–52.0)
Hemoglobin: 12.2 g/dL — ABNORMAL LOW (ref 13.0–17.0)
POTASSIUM: 4.1 mmol/L (ref 3.5–5.1)
Sodium: 136 mmol/L (ref 135–145)
TCO2: 25 mmol/L (ref 22–32)

## 2018-06-07 LAB — FIBRINOGEN: FIBRINOGEN: 370 mg/dL (ref 210–475)

## 2018-06-07 LAB — TYPE AND SCREEN
ABO/RH(D): A POS
Antibody Screen: NEGATIVE

## 2018-06-07 LAB — ABO/RH: ABO/RH(D): A POS

## 2018-06-07 LAB — MAGNESIUM: Magnesium: 1.8 mg/dL (ref 1.7–2.4)

## 2018-06-07 LAB — APTT: APTT: 32 s (ref 24–36)

## 2018-06-07 SURGERY — IR WITH ANESTHESIA
Anesthesia: General

## 2018-06-07 MED ORDER — CLEVIDIPINE BUTYRATE 0.5 MG/ML IV EMUL
0.0000 mg/h | INTRAVENOUS | Status: DC
  Administered 2018-06-07: 4 mg/h via INTRAVENOUS
  Filled 2018-06-07: qty 50

## 2018-06-07 MED ORDER — FENTANYL CITRATE (PF) 100 MCG/2ML IJ SOLN
INTRAMUSCULAR | Status: DC | PRN
  Administered 2018-06-07: 100 ug via INTRAVENOUS

## 2018-06-07 MED ORDER — ROCURONIUM BROMIDE 50 MG/5ML IV SOSY
PREFILLED_SYRINGE | INTRAVENOUS | Status: DC | PRN
  Administered 2018-06-07: 30 mg via INTRAVENOUS
  Administered 2018-06-07 (×2): 20 mg via INTRAVENOUS

## 2018-06-07 MED ORDER — STROKE: EARLY STAGES OF RECOVERY BOOK
Freq: Once | Status: DC
  Filled 2018-06-07: qty 1

## 2018-06-07 MED ORDER — SUCCINYLCHOLINE CHLORIDE 20 MG/ML IJ SOLN
INTRAMUSCULAR | Status: DC | PRN
  Administered 2018-06-07: 100 mg via INTRAVENOUS

## 2018-06-07 MED ORDER — MIDAZOLAM HCL 2 MG/2ML IJ SOLN
1.0000 mg | INTRAMUSCULAR | Status: DC | PRN
  Administered 2018-06-07: 1 mg via INTRAVENOUS
  Filled 2018-06-07: qty 2

## 2018-06-07 MED ORDER — IOPAMIDOL (ISOVUE-370) INJECTION 76%
50.0000 mL | Freq: Once | INTRAVENOUS | Status: AC | PRN
  Administered 2018-06-07: 50 mL via INTRAVENOUS

## 2018-06-07 MED ORDER — CEFAZOLIN SODIUM-DEXTROSE 2-4 GM/100ML-% IV SOLN
INTRAVENOUS | Status: AC
  Filled 2018-06-07: qty 100

## 2018-06-07 MED ORDER — SODIUM CHLORIDE 0.9 % IV SOLN
INTRAVENOUS | Status: DC
  Administered 2018-06-07 – 2018-06-10 (×5): via INTRAVENOUS

## 2018-06-07 MED ORDER — FENTANYL CITRATE (PF) 100 MCG/2ML IJ SOLN
50.0000 ug | INTRAMUSCULAR | Status: DC | PRN
  Administered 2018-06-08: 50 ug via INTRAVENOUS
  Filled 2018-06-07: qty 2

## 2018-06-07 MED ORDER — FENTANYL CITRATE (PF) 100 MCG/2ML IJ SOLN
INTRAMUSCULAR | Status: AC
  Filled 2018-06-07: qty 2

## 2018-06-07 MED ORDER — PHENYLEPHRINE HCL-NACL 10-0.9 MG/250ML-% IV SOLN
0.0000 ug/min | INTRAVENOUS | Status: DC
  Administered 2018-06-08: 13.333 ug/min via INTRAVENOUS
  Administered 2018-06-08: 30 ug/min via INTRAVENOUS
  Filled 2018-06-07 (×2): qty 250

## 2018-06-07 MED ORDER — FAMOTIDINE IN NACL 20-0.9 MG/50ML-% IV SOLN
20.0000 mg | Freq: Two times a day (BID) | INTRAVENOUS | Status: DC
  Administered 2018-06-07: 20 mg via INTRAVENOUS
  Filled 2018-06-07: qty 50

## 2018-06-07 MED ORDER — MIDAZOLAM HCL 2 MG/2ML IJ SOLN
1.0000 mg | INTRAMUSCULAR | Status: DC | PRN
  Administered 2018-06-08: 1 mg via INTRAVENOUS
  Filled 2018-06-07: qty 2

## 2018-06-07 MED ORDER — CEFAZOLIN SODIUM-DEXTROSE 2-3 GM-%(50ML) IV SOLR
INTRAVENOUS | Status: DC | PRN
  Administered 2018-06-07: 2 g via INTRAVENOUS

## 2018-06-07 MED ORDER — CLOPIDOGREL BISULFATE 300 MG PO TABS
ORAL_TABLET | ORAL | Status: AC
  Filled 2018-06-07: qty 1

## 2018-06-07 MED ORDER — IOHEXOL 300 MG/ML  SOLN
300.0000 mL | Freq: Once | INTRAMUSCULAR | Status: AC | PRN
  Administered 2018-06-07: 100 mL via INTRA_ARTERIAL

## 2018-06-07 MED ORDER — GLYCOPYRROLATE 0.2 MG/ML IJ SOLN
INTRAMUSCULAR | Status: DC | PRN
  Administered 2018-06-07: 0.2 mg via INTRAVENOUS

## 2018-06-07 MED ORDER — ASPIRIN 325 MG PO TABS
ORAL_TABLET | ORAL | Status: AC
  Filled 2018-06-07: qty 1

## 2018-06-07 MED ORDER — ACETAMINOPHEN 325 MG PO TABS
650.0000 mg | ORAL_TABLET | ORAL | Status: DC | PRN
  Administered 2018-06-09: 650 mg via ORAL
  Filled 2018-06-07: qty 2

## 2018-06-07 MED ORDER — EPTIFIBATIDE 20 MG/10ML IV SOLN
INTRAVENOUS | Status: AC
  Filled 2018-06-07: qty 10

## 2018-06-07 MED ORDER — CEPHALEXIN 250 MG PO CAPS
500.0000 mg | ORAL_CAPSULE | Freq: Three times a day (TID) | ORAL | Status: DC
  Filled 2018-06-07: qty 2

## 2018-06-07 MED ORDER — NITROGLYCERIN 1 MG/10 ML FOR IR/CATH LAB
INTRA_ARTERIAL | Status: AC
  Filled 2018-06-07: qty 10

## 2018-06-07 MED ORDER — SODIUM CHLORIDE 0.9 % IV SOLN: Freq: Once | INTRAVENOUS | Status: DC

## 2018-06-07 MED ORDER — ALTEPLASE (STROKE) FULL DOSE INFUSION
0.9000 mg/kg | Freq: Once | INTRAVENOUS | Status: AC
  Administered 2018-06-07: 70.3 mg via INTRAVENOUS
  Filled 2018-06-07: qty 100

## 2018-06-07 MED ORDER — ONDANSETRON HCL 4 MG/2ML IJ SOLN
INTRAMUSCULAR | Status: DC | PRN
  Administered 2018-06-07: 4 mg via INTRAVENOUS

## 2018-06-07 MED ORDER — LIDOCAINE HCL 1 % IJ SOLN
INTRAMUSCULAR | Status: AC
  Filled 2018-06-07: qty 20

## 2018-06-07 MED ORDER — SODIUM CHLORIDE 0.9 % IV SOLN
INTRAVENOUS | Status: DC | PRN
  Administered 2018-06-07: 100 ug/min via INTRAVENOUS
  Administered 2018-06-07: 150 ug/min via INTRAVENOUS

## 2018-06-07 MED ORDER — ALBUMIN HUMAN 5 % IV SOLN
INTRAVENOUS | Status: DC | PRN
  Administered 2018-06-07: 17:00:00 via INTRAVENOUS

## 2018-06-07 MED ORDER — FENTANYL CITRATE (PF) 100 MCG/2ML IJ SOLN
50.0000 ug | INTRAMUSCULAR | Status: DC | PRN
  Administered 2018-06-08 – 2018-06-14 (×4): 50 ug via INTRAVENOUS
  Filled 2018-06-07 (×4): qty 2

## 2018-06-07 MED ORDER — CEFTRIAXONE SODIUM 1 G IJ SOLR: 1.0000 g | INTRAMUSCULAR | Status: DC

## 2018-06-07 MED ORDER — TIROFIBAN HCL IN NACL 5-0.9 MG/100ML-% IV SOLN
INTRAVENOUS | Status: AC
  Filled 2018-06-07: qty 100

## 2018-06-07 MED ORDER — SODIUM CHLORIDE 0.9 % IV SOLN
1.0000 g | INTRAVENOUS | Status: DC
  Administered 2018-06-07 – 2018-06-13 (×7): 1 g via INTRAVENOUS
  Filled 2018-06-07 (×7): qty 10

## 2018-06-07 MED ORDER — TICAGRELOR 90 MG PO TABS
ORAL_TABLET | ORAL | Status: AC
  Filled 2018-06-07: qty 2

## 2018-06-07 MED ORDER — ACETAMINOPHEN 160 MG/5ML PO SOLN
650.0000 mg | ORAL | Status: DC | PRN
  Administered 2018-06-10 – 2018-06-15 (×7): 650 mg
  Filled 2018-06-07 (×8): qty 20.3

## 2018-06-07 MED ORDER — FENTANYL CITRATE (PF) 100 MCG/2ML IJ SOLN: INTRAMUSCULAR | Status: DC | PRN

## 2018-06-07 MED ORDER — TRANEXAMIC ACID 1000 MG/10ML IV SOLN
1000.0000 mg | INTRAVENOUS | Status: AC
  Administered 2018-06-07: 1000 mg via INTRAVENOUS
  Filled 2018-06-07 (×2): qty 10

## 2018-06-07 MED ORDER — PROPOFOL 10 MG/ML IV BOLUS
INTRAVENOUS | Status: DC | PRN
  Administered 2018-06-07: 100 mg via INTRAVENOUS

## 2018-06-07 MED ORDER — PROPOFOL 500 MG/50ML IV EMUL
INTRAVENOUS | Status: DC | PRN
  Administered 2018-06-07: 50 ug/kg/min via INTRAVENOUS

## 2018-06-07 MED ORDER — ACETAMINOPHEN 650 MG RE SUPP
650.0000 mg | RECTAL | Status: DC | PRN
  Administered 2018-06-08 – 2018-06-09 (×2): 650 mg via RECTAL
  Filled 2018-06-07 (×2): qty 1

## 2018-06-07 MED ORDER — SODIUM CHLORIDE 0.9 % IV SOLN
50.0000 mL | Freq: Once | INTRAVENOUS | Status: AC
  Administered 2018-06-07 (×2): via INTRAVENOUS

## 2018-06-07 NOTE — ED Triage Notes (Signed)
Patient last seen normal by wife at 1500 today. Upon arrival back to home patient was non-responsive, left sided weakness and unable to follow any commands.

## 2018-06-07 NOTE — H&P (Addendum)
Neurology history and physical    CC: Sudden onset of left flaccidity and aphasia  History is obtained from: Both wife and EMS  HPI: Justin [name] is an 84 y.o. male with no stroke risk factors that are noted.  Patient apparently was last seen at 1500 hrs. by his wife.  She states that she went to brush her teeth and when she came back and spoke to him, he was not talking back to her.  She turned around and found him slumped on the chair leaning to the left.  She noted that his right arm was held in the air but was afraid he was going to fall down.  She immediately called EMS.  Who brought patient in as a code stroke.  During transport patient was completely aphasic, had a left facial droop, left hemiparesis, did not blink to threat on the left.  Upon arrival at the bridge patient remained the same.  There was a question if he was on Xarelto secondary to DVT.  A call to the wife was made immediately and she is very adamant that he had not been taking the Xarelto or any type of blood thinner.  At that time risks/benefits of tPA were discussed over the phone with his wife, with recommendation to proceed with tPA. His wife provided informed consent to Dr. Otelia LimesLindzen which was witnessed by neurology NP, Felicie Mornavid Smith. There were no contraindications to tPA. After tPA infusion was started, STAT CTA was obtained, which showed a right M1 occlusion.  Consent was then obtained from wife by interventional radiology to go interventional suite to see if clot could be removed.   LKW: 1500 hrs. on 10/28/2017 tpa given?: Yes Premorbid modified Rankin scale (mRS): 0 NIH score: 25   ROS:  Unable to obtain due to altered mental status.   Past Medical History:  Diagnosis Date  . Erysipelas   . Scarlet fever   . Venous stasis ulcers of both lower extremities (HCC)      Family History  Problem Relation Age of Onset  . Heart disease Mother   . Cancer Mother        leukemia  . Diabetes Neg Hx      Social  History:   reports that he quit smoking about 55 years ago. He has never used smokeless tobacco. He reports that he does not drink alcohol or use drugs.  Medications No current facility-administered medications for this encounter.   Current Outpatient Medications:  .  cephALEXin (KEFLEX) 500 MG capsule, Take 1 capsule (500 mg total) by mouth 3 (three) times daily for 7 days., Disp: 21 capsule, Rfl: 0 .  tamsulosin (FLOMAX) 0.4 MG CAPS capsule, Take 1 capsule (0.4 mg total) by mouth daily., Disp: 30 capsule, Rfl: 0   Exam: Current vital signs: There were no vitals taken for this visit. Vital signs in last 24 hours:    GENERAL: Awake, aphasic and unable to follow commands HEENT: -Facial droop Ext: warm, well perfused, intact peripheral , lateral left lower extremity showed edema with excoriation and hemosiderin staining. Pitting edema to BLE noted, worse on the left.   NEURO:  Mental Status: Patient at this time is densely aphasic with a left facial droop, not able to follow commands or answer questions.  He was fighting eye-opening movements and had left hemineglect. Cranial Nerves: Pupils equal round reactive to light 2 mm and brisk.  Extraocular movements showed a forced deviation to the right and unable to cross midline  with oculocephalic maneuver. No blink to threat on the left. Left facial droop.  Motor: Flaccid on the left arm with no movement to noxious. Left leg triple flexion reflex to noxious but was not able to lift off the bed.  Right arm and leg: able to lift off the bed with 4/5 strength Tone: is normal and bulk is normal, with the exception of flaccid left arm Sensation- Absent to the left arm or leg however patient did localize to pain with the right arm and particularly right leg Coordination: Unable to examine Gait-able to examine  Labs I have reviewed labs in epic and the results pertinent to this consultation are:   CBC    Component Value Date/Time   WBC 12.0  (H) 05/31/2018 0528   RBC 3.90 (L) 05/31/2018 0528   HGB 12.2 (L) 06/17/2018 1606   HCT 36.0 (L) 06/17/18 1606   PLT 401 (H) 05/31/2018 0528   MCV 92.6 05/31/2018 0528   MCV 101.9 (A) 09/25/2012 1124   MCH 31.0 05/31/2018 0528   MCHC 33.5 05/31/2018 0528   RDW 14.3 05/31/2018 0528   LYMPHSABS 1.3 05/28/2018 0301   MONOABS 0.7 05/28/2018 0301   EOSABS 1.3 (H) 05/28/2018 0301   BASOSABS 0.0 05/28/2018 0301    CMP     Component Value Date/Time   NA 136 06-17-18 1606   K 4.1 2018/06/17 1606   CL 103 June 17, 2018 1606   CO2 23 06/02/2018 0530   GLUCOSE 94 06/17/18 1606   BUN 27 (H) 06-17-18 1606   CREATININE 0.90 06/17/2018 1606   CREATININE 1.00 01/01/2012 1118   CALCIUM 7.9 (L) 06/02/2018 0530   PROT 5.2 (L) 05/29/2018 0519   ALBUMIN 2.3 (L) 05/29/2018 0519   AST 42 (H) 05/29/2018 0519   ALT 33 05/29/2018 0519   ALKPHOS 69 05/29/2018 0519   BILITOT 0.5 05/29/2018 0519   GFRNONAA >60 06/02/2018 0530   GFRAA >60 06/02/2018 0530    Lipid Panel  No results found for: CHOL, TRIG, HDL, CHOLHDL, VLDL, LDLCALC, LDLDIRECT   Imaging I have reviewed the images obtained:  CT-scan of the brain--no acute stroke bleed or  .  ASPECTS includes at: Basal ganglia level  IC, C, L, I, M1,M2,M3        Level of Ventricles  M4,M5,M6  RED is  (-1 point )from the total score of 10.  A score equal to or less than 6/7 predicts worse functional outcome at 3 months as well as symptomatic hemorrhage.   Total Score: 10   CTA of head revealed a right M1 cutoff    Felicie Morn PA-C [facility] [phone]  M-F  (9:00 am- 5:00 PM)  17-Jun-2018, 4:16 PM    Assessment: 84 year old male brought in by EMS as code stroke. CT head with no hemorrhage. CTA showing a right M1 occlusion.  1. tPA administered after obtaining informed consent from his wife over the telephone. Patient's aphasia precluded medical decision making by him at the time of Neurology assessment.  2.  Following CTA, Dr. Loreta Ave obtained informed consent for clot retraction procedure from patient's wife. The patient immediately was brought to interventional radiology.  Plan: - Administered TPA - Keep blood pressure systolically under 180 diastolically under 105 - No aspirin or antiplatelet agents for at least 24 hours following tPA and VIR. May start if CT head 24 hours following tPA/VIR is negative for hemorrhagic conversion - Frequent neuro checks and NIH stroke scales - Admission to ICU under neurology service --  Consulting CCM for vent management as he will need to be intubated and sedated for endovascular procedure - MRI when stable -- TTE when stable -- PT/OT/Speech  40 minutes spent in the emergent neurological evaluation and management of this critically ill acute stroke patient.   I have seen and evaluated the patient and amended the assessment and plan above.  Electronically signed: Dr. Caryl Pina

## 2018-06-07 NOTE — ED Notes (Signed)
I-stat troponin results of 0.26 reported to Dr. Lockie Molauratolo.

## 2018-06-07 NOTE — Anesthesia Procedure Notes (Signed)
Arterial Line Insertion Start/End9/23/2019 5:05 PM Performed by: Gwenyth AllegraAdami, Lakera Viall, CRNA, CRNA  Patient location: OOR procedure area. Catheter size: 20 G Hand hygiene performed  and maximum sterile barriers used   Attempts: 1 Procedure performed without using ultrasound guided technique. Following insertion, dressing applied. Post procedure assessment: normal  Patient tolerated the procedure well with no immediate complications.

## 2018-06-07 NOTE — Progress Notes (Signed)
Pharmacist Code Stroke Response  Notified to mix tPA at 1607 by Dr. Otelia LimesLindzen Delivered tPA to RN at 1612  Issues/delays encountered (if applicable): none  Justin [name] 06/06/2018 4:13 PM

## 2018-06-07 NOTE — Progress Notes (Signed)
NeuroInterventional Radiology  Pre-Procedure Note  History: 84 yo male with acute right MCA syndrome.  Witnessed symptoms by his wife at home, brought via EMS as code stroke [facility]   Baseline mRS: 0 NIHSS:  23  CT ASPECTS: 10 CTA:   Right M1 occlusion CTP:   n/a   Given the patient's symptoms, imaging findings, baseline function, I believe they are an appropriate candidate for mechanical thrombectomy.    The risks and benefits of the procedure were discussed with the patient's wife, with specific risks including: bleeding, infection, arterial injury/dissection, contrast reaction, kidney injury, need for further procedure/surgery, neurologic deficit, 10-15% risk of intracranial hemorrhage, cardiopulmonary collapse, death. All questions were answered.  The patient/family would like to proceed with attempt at thrombectomy.   Discussed with Dr. Otelia LimesLindzen.   Plan for cerebral angiogram and attempt at mechanical thrombectomy.   Signed,   Yvone NeuJaime S. Loreta AveWagner, DO

## 2018-06-07 NOTE — Progress Notes (Signed)
Changed TPA to NS

## 2018-06-07 NOTE — Code Documentation (Signed)
84 yo male coming from home with complaints of sudden onset of left sided weakness, trouble speaking, and right sided gaze at 1500. EMS was called and activated a Code Stroke. Stroke Team met patient at the door. Initial NIHSS 23 due to right sided gaze, left facial droop, left visual field cut, left arm weakness, left leg weakness, and aphasia. CT completed and showed no hemorrhage. Neurologist called family and got consent for tPA. CTA completed and showed Right MCA M1 occlusion. tPA started. Earleen Newport MD spoke with family on the phone and family consented for IR. Patient taken to IR. Handoff given to [place], Therapist, sports.

## 2018-06-07 NOTE — Consult Note (Signed)
NAME:  Justin [name], MRN:  [id number], DOB:  August 30, 1928, LOS: 0 ADMISSION DATE:  05/18/2018, CONSULTATION DATE:  05/25/2018 REFERRING MD:  Otelia Limes MD CHIEF COMPLAINT: Left sided flaccidity and aphasia  Brief History   6 yoM presenting with aphagia, right gaze, and left hemiparesis.  LSW 1500.  CTH neg, CTA showing Right MCA M1 occlusion.  Given TPA at approximately 1613 .  Taken for neuro IR where he was intubated for procedure at 4:50PM. S/p IR mechanical thrombectomy pt had an Acute SAH that is evolving.  Significant Hospital Events   9/23 Admit, ETT, A-line, Neuro IR, reversal TPA  Consults: date of consult/date signed off & final recs:  9/23 Neuro IR  9/23 PCCM   Procedures (surgical and bedside):  9/23 ETT 9/23 Neuro IR- thrombectomy/ revascularization  Significant Diagnostic Tests:  05/25/2018 CTH >> 1. No acute hemorrhage or mass lesion. 2. ASPECTS is 10. 3. 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  06/05/2018 CTA head/ neck/ perfusion >> 1. Emergent large vessel occlusion of the right middle cerebral artery M1 segment with minimal collateral flow demonstrated in the right MCA territory. 2. Biapical pulmonary opacities most consistent with fibrosis or scarring. A superimposed mass or consolidation would be difficult to exclude. If prior chest CTs exists, comparison would be helpful. 3. No arterial occlusion or hemodynamically significant stenosis of the major arteries of the neck.  05/30/2018 at 7:27pm  COMPARISON with CT HEAD June 07, 2018 at 1607 hours and procedural CT HEAD at June 07, 2018 1828 hours.  IMPRESSION: 1. Multiple new intraparenchymal hematomas with hematocrit levels consistent with acute and suspected active hemorrhage. 2. LEFT lateral intraventricular hemorrhage, possible parenchymal extension. 3. Scattered extra-axial blood products, confounded by postcontrast Imaging.   Micro Data: 05/27/2018 MRSA  PCR >>  Antimicrobials:  PTA - keflex   Subjective:  Pt is intubated GCS 3 not on sedation.   Objective   Blood pressure (!) 148/82, pulse 88, resp. rate 15, weight 78.1 kg, SpO2 99 %.       No intake or output data in the 24 hours ending 05/31/2018 1651 Filed Weights   05/20/2018 1617  Weight: 78.1 kg    Examination: General: unresponsive not sedated intubated HEENT: MM pink/moist Neuro: GCS 3 CV: s1s2 rrr, no m/r/g PULM: even/non-labored, lungs bilaterally WU:JWJX, non-tender, bsx4 active  Extremities: b/l lower extremities with severe venous stasis ulcers ( R>L) purulent involving mostly anterior shin on right and anterior posterior right leg Arms have evidence of ecchymoses Skin: small stage II sacral ulcer   Assessment & Plan:   NEURO: Acute Right MCA M1 Occlusion s/p TPA, thrombectomy in IR  Plan: Neurology primary Reversal of TPA initiated Neuro admin TXA and Cryo due to assumption that bleed is ongoing Repeat CTH 4 hrs post last scan  Family informed of guarded prognosis Not currently on sedation    CARDIAC: Hypertension/BP control post TPA Elevated troponin Prolonged QTc - EKG NSR with prolonged QTc 0.502 and LBBB  Plan: Continue on cleviprex per Neuro Arterial line has good wave form Tele monitoring Trend troponin and EKG Hemodynamically stable    PULMONARY: Endotracheally intubated prior to IR procedure Remains intubated due to Acute encephalopathy post CVA Plan: On PRVC ABG-7.4/35/103/22- decrease FiO2 to keep O2 sat >92% CXR to assess ETT position  ID: Superimposed doft tissue infection on venous stasis ulcers Plan: Wound care consult Started on broad spectrum antibiotics   Endocrine: No h/o insulin dependence  GI: Remain  NPO for now.    Heme: Acute SAH s/p TPA Reversal of TPA H/o DVTs was on Xarelto Plan: Started on TXA and cryo Fibrinogen prior to 370 If Hgb<7 transfuse PRBCs .Marland Kitchen.A POS Risk of reversal - procoagulant- if  pt's prognosis improves he may need a filter  RENAL Chronic indwelling Foley Baseline Cr Lab Results  Component Value Date   CREATININE 0.90 2017/09/25   CREATININE 0.95 2017/09/25   CREATININE 0.67 06/02/2018   Mg 1.8- will replace Phos 3.9    Disposition / Summary of Today's Plan 11/15/17   Admit to ICU, reverse TPA, strict BP control, frequent neuro monitoring.    Diet: NPO Pain/Anxiety/Delirium protocol (if indicated): current GCS 3 not on sedation prn PAD w/propofol/ prn fentanyl VAP protocol (if indicated) yes DVT prophylaxis:none  GI prophylaxis: pepcid Hyperglycemia protocol: CBG, SSI if glucose > 180 Mobility: BR  Code Status: full  Family Communication: wife updated by Neuro team regarding prognosis and current clinical condition  Labs   CBC: Recent Labs  Lab 11/15/17 1600 11/15/17 1606  WBC 12.1*  --   NEUTROABS PENDING  --   HGB 11.7* 12.2*  HCT 35.9* 36.0*  MCV 96.0  --   PLT 495*  --    Basic Metabolic Panel: Recent Labs  Lab 06/02/18 0530 11/15/17 1600 11/15/17 1606  NA 138 136 136  K 3.3* 4.1 4.1  CL 106 100 103  CO2 23 24  --   GLUCOSE 96 101* 94  BUN 16 24* 27*  CREATININE 0.67 0.95 0.90  CALCIUM 7.9* 8.5*  --    GFR: Estimated Creatinine Clearance: 58.6 mL/min (by C-G formula based on SCr of 0.9 mg/dL). Recent Labs  Lab 11/15/17 1600  WBC 12.1*   Liver Function Tests: Recent Labs  Lab 11/15/17 1600  AST 24  ALT 19  ALKPHOS 70  BILITOT 0.9  PROT 5.9*  ALBUMIN 2.9*   No results for input(s): LIPASE, AMYLASE in the last 168 hours. No results for input(s): AMMONIA in the last 168 hours. ABG    Component Value Date/Time   TCO2 25 2017/09/25 1606    Coagulation Profile: Recent Labs  Lab 11/15/17 1600  INR 1.26   Cardiac Enzymes: No results for input(s): CKTOTAL, CKMB, CKMBINDEX, TROPONINI in the last 168 hours. HbA1C: No results found for: HGBA1C CBG: No results for input(s): GLUCAP in the last 168  hours.  Admitting History of Present Illness.   84 year old male with PMH significant for but not limited to former smoker (quit > 50 years ago), venous stasis and hx of DVT treated with Xarelto.    Recently discharged 9/18 after being treated for sepsis secondary to bilateral venous stasis and cellulitis after weakness and frequent falls.  He required coude foley catheter for urinary retention with scheduled urology outpatient followup.  He refused SNF placement and was discharged as a DNR/ DNI.  Presenting from home as code stroke with EMS with sudden onset of left hemiparesis, aphasia and right gaze.  LSW was 1500.  NIHSS 23.  CT head without hemorrhage.  TPA was given at 1613.    CTA showing RIGHT MCA M1 occlusion.  Patient then taken to neuro IR for thrombectomy and revascularization.  Patient intubated for procedure by anesthesia and therefore PCCM consulted for further vent and medical management.   During Neuro IR mechanical thrombectomy Acute SAH noted and presumed to be related to TPA. Repeat imaging shows ongoing bleed. Pt started on TPA reversal. GCS 3  on our evaluation post IR no sedation   Review of Systems:   Unable as patient is intubated and sedated on mechanical ventilation.    Past medical history  He,  has a past medical history of Erysipelas, Scarlet fever, and Venous stasis ulcers of both lower extremities (HCC).   Surgical History    Past Surgical History:  Procedure Laterality Date  . TONSILLECTOMY       Social History   Social History   Socioeconomic History  . Marital status: Married    Spouse name: Not on file  . Number of children: Not on file  . Years of education: Not on file  . Highest education level: Not on file  Occupational History  . 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 Financial resource strain: Not on file  . Food insecurity:    Worry: Not on file    Inability: Not on file  . Transportation needs:    Medical: Not on  file    Non-medical: Not on file  Tobacco Use  . Smoking status: Former Smoker    Last attempt to quit: 09/15/1962    Years since quitting: 55.7  . Smokeless tobacco: Never Used  Substance and Sexual Activity  . Alcohol use: No  . Drug use: No  . Sexual activity: Not on file  Lifestyle  . Physical activity:    Days per week: Not on file    Minutes per session: Not on file  . Stress: Not on file  Relationships  . Social connections:    Talks on phone: Not on file    Gets together: Not on file    Attends religious service: Not on file    Active member of club or organization: Not on file    Attends meetings of clubs or organizations: Not on file    Relationship status: Not on file  . Intimate partner violence:    Fear of current or ex partner: Not on file    Emotionally abused: Not on file    Physically abused: Not on file    Forced sexual activity: Not on file  Other Topics Concern  . Not on file  Social History Narrative   Lives with wife.  2 children in [place], 1 in [place], 1 in [place]  ,  reports that he quit smoking about 55 years ago. He has never used smokeless tobacco. He reports that he does not drink alcohol or use drugs.   Family history   His family history includes Cancer in his mother; Heart disease in his mother. There is no history of Diabetes.   Allergies No Known Allergies  Home meds  Prior to Admission medications   Medication Sig Start Date End Date Taking? Authorizing Provider  cephALEXin (KEFLEX) 500 MG capsule Take 1 capsule (500 mg total) by mouth 3 (three) times daily for 7 days. 06/02/18 06/09/18  Amin, Loura Halt, MD  tamsulosin (FLOMAX) 0.4 MG CAPS capsule Take 1 capsule (0.4 mg total) by mouth daily. 06/03/18 07/03/18  Dimple Nanas, MD       I have discussed the plan of care with the medical team (including consultants, NP/PA, RN) and patient/patient's family members.  Signed Dr Newell Coral [facility] Locums

## 2018-06-07 NOTE — Anesthesia Preprocedure Evaluation (Signed)
Anesthesia Evaluation  Patient identified by MRN, date of birth, ID band Patient awake    Reviewed: Allergy & Precautions, NPO status , Patient's Chart, lab work & pertinent test results  Airway Mallampati: II  TM Distance: >3 FB     Dental   Pulmonary former smoker,    breath sounds clear to auscultation       Cardiovascular + Peripheral Vascular Disease   Rhythm:Regular Rate:Normal     Neuro/Psych    GI/Hepatic Neg liver ROS,   Endo/Other  negative endocrine ROS  Renal/GU negative Renal ROS     Musculoskeletal   Abdominal   Peds  Hematology   Anesthesia Other Findings   Reproductive/Obstetrics                             Anesthesia Physical Anesthesia Plan  ASA: III  Anesthesia Plan: General   Post-op Pain Management:    Induction: Intravenous  PONV Risk Score and Plan: Treatment may vary due to age or medical condition  Airway Management Planned:   Additional Equipment:   Intra-op Plan:   Post-operative Plan: Possible Post-op intubation/ventilation  Informed Consent: I have reviewed the patients History and Physical, chart, labs and discussed the procedure including the risks, benefits and alternatives for the proposed anesthesia with the patient or authorized representative who has indicated his/her understanding and acceptance.   Dental advisory given  Plan Discussed with: CRNA and Anesthesiologist  Anesthesia Plan Comments:         Anesthesia Quick Evaluation

## 2018-06-07 NOTE — Progress Notes (Signed)
Multifocal hemorrhage noted on follow up CT head in VIR suite following TICI-3 reperfusion. tPA reversal orders have been initiated with cryoprecipitate and tranexemic acid to be administered. A second follow up CT has been ordered to assess for possible worsening of the hemorrhage since the VIR CT scan. Discussed with Dr. Loreta AveWagner of VIR and with Dr. Karie KirksBloomer of Radiology.   Electronically signed: Dr. Caryl PinaEric Kelsi Benham

## 2018-06-07 NOTE — Anesthesia Procedure Notes (Signed)
Procedure Name: Intubation Date/Time: 05/18/2018 4:50 PM Performed by: Gwenyth AllegraAdami, Duron Meister, CRNA Pre-anesthesia Checklist: Patient identified, Emergency Drugs available and Suction available Patient Re-evaluated:Patient Re-evaluated prior to induction Oxygen Delivery Method: Circle system utilized Preoxygenation: Pre-oxygenation with 100% oxygen Induction Type: IV induction, Rapid sequence and Cricoid Pressure applied Grade View: Grade II Tube type: Subglottic suction tube Tube size: 8.0 mm Number of attempts: 1 Airway Equipment and Method: Stylet Placement Confirmation: ETT inserted through vocal cords under direct vision,  positive ETCO2 and breath sounds checked- equal and bilateral Secured at: 22 cm Tube secured with: Tape Dental Injury: Teeth and Oropharynx as per pre-operative assessment

## 2018-06-07 NOTE — ED Provider Notes (Signed)
MSE was initiated and I personally evaluated the patient on arrival to the ED at the bridge on June 07, 2018. Patient presented to the ED as a code stroke.  Patient had acute onset of weakness, inability to speak.  His last seen normal was at 3 PM.  EMS noted the patient had left-sided hemiparesis.  He is not able to speak.  Patient was evaluated by the stroke team including Dr. Otelia LimesLindzen.  Patient appeared to be protecting his airway.  Patient was then transported to CT scan immediately The patient appears stable so that the remainder of the MSE may be completed by another provider.   Linwood DibblesKnapp, Marvelene Stoneberg, MD April 02, 2018 [phone]

## 2018-06-07 NOTE — Procedures (Signed)
Neuro-Interventional Radiology  Post Cerebral Angiogram Procedure Note  History:   84 yo male with history of acute right MCA stroke, with acute left sided symptoms.    Baseline mRS:  0 NIHSS:   23 Last Known Well:  1500 ASPECTS:   10 Site of occlusion:  Right MCA, M1   Skin Puncture:   1650  Initial TICI of target vessel  0  First Pass Date & Time: 1716, Embotrap and local aspiration     TICI 2a Second pass   1738, Embotrap and local aspiration     TICI 2a  Third pass   1755, Solitaire and local aspiration     TICI 3  IV tPA administered?: yes IA Medication:  none  Final mTICI Score & time: TICI 3 & 1755  Anesthesia    GETA  Procedure:  - US guided R CFA access - Cerebral Angiogram - Mechanical thrombectomy of ELVO involving right MCA, M1 - Deployment of Angioseal for hemostasis - Flat panel CT in NIR suite  Complications: Acute SAH in pericallosal area and left temporal region, away from the target artery, presumably tPA related.  EBL: ~50cc  Recommendations: - right hip straight overnight - Goal SBP 120-140.   - Frequent NV checks - Repeat CT now for baseline.  - NIR to follow - discussed with CC/Pulm for vent management, maintain endotracheal tube  Signed,  Yvone NeuJaime S. Loreta AveWagner, DO

## 2018-06-07 NOTE — Sedation Documentation (Signed)
8 Fr. Angioseal to right groin 

## 2018-06-07 NOTE — Progress Notes (Signed)
Pt intubated and under the care of anesthesia at this time.

## 2018-06-07 NOTE — Progress Notes (Signed)
On-call cross coverage note  Subjective: Received call from radiologist about the repeat noncontrast CT of the head report.  Multifocal hemorrhages in both cerebral hemispheres and in the cerebellar folia as well as intra-ventricular blood, more in the left lateral ventricle. Patient status post IV TPA and endovascular thrombectomy with TICI 3 reperfusion for a right M1 stroke.  Objective: Systolic blood pressure 104 Heart rate 72 Neurological exam Patient on 25 mics of propofol that was held for the exam. Intubated Pupils are mildly anisocoric with left pupil 3 mm reactive, right pupil 2.5 mm reactive. Corneal responses are present Oculocephalics are present Breathing over the ventilator No spontaneous movements No speech output No response to noxious stimulation  I have reviewed the imaging Noncontrast CT of the head post procedure shows multifocal intra-parenchymal hematomas along with left lateral intra-ventricular hemorrhage and scattered extraocular blood products which may be confounded by postcontrast staining as he is post endovascular treatment.  Currently no evidence of hydrocephalus..  Assessment: 84 year old man with a right MCA stroke status p52ost IV TPA and endovascular thrombectomy achieving TICI 3 reperfusion, with the Dyna CT showing possible bleed and follow-up CT scan without contrast confirming multifocal bleeds as above including intraventricular and cerebellar bleeds.  Plan: Discussed with Dr. Otelia LimesLindzen.  Dr. Otelia LimesLindzen is ordered the TPA reversal protocol. Exam is difficult to ascertain as the patient is post procedure and post sedation still intubated. -Agree with the reversal of TPA -Repeat CT head in 3 hours -Will consider neurosurgical consultation if evidence of hydrocephalus-currently no evidence of hydrocephalus -I spoke with his wife Ms. Justin [name] over the phone.  Updated her with the current clinical situation and the plan going forward.  -I have also told  his wife that I will update her with the follow-up results as they become available and then have further discussions as appropriate.  -- Justin DikesAshish Nikash Mortensen, MD [facility] Pager: [phone] If 7pm to 7am, please call on call as listed on AMION.  CRITICAL CARE ATTESTATION This patient is critically ill and at significant risk of neurological worsening, death and care requires constant monitoring of vital signs, hemodynamics, respiratory, and cardiac monitoring. I spent additional 30  minutes of neurocritical care time performing neurological assessment, discussion with family, other specialists and medical decision making of high complexity in the care of  this patient.

## 2018-06-07 NOTE — ED Notes (Signed)
Patient taken to IR

## 2018-06-07 NOTE — Transfer of Care (Signed)
Immediate Anesthesia Transfer of Care Note  Patient: Justin [name]  Procedure(s) Performed: IR WITH ANESTHESIA (N/A )  Patient Location: NICU  Anesthesia Type:General  Level of Consciousness: unresponsive and Patient remains intubated per anesthesia plan  Airway & Oxygen Therapy: Patient placed on Ventilator (see vital sign flow sheet for setting)  Post-op Assessment: Report given to RN and Post -op Vital signs reviewed and stable  Post vital signs: Reviewed and stable  Last Vitals:  Vitals Value Taken Time  BP 104/56 05/31/2018  7:34 PM  Temp    Pulse 77 06/14/2018  7:41 PM  Resp 14 05/19/2018  7:41 PM  SpO2 100 % 06/13/2018  7:41 PM  Vitals shown include unvalidated device data.  Last Pain: There were no vitals filed for this visit.       Complications: No apparent anesthesia complications

## 2018-06-08 ENCOUNTER — Other Ambulatory Visit (HOSPITAL_COMMUNITY): Payer: Self-pay

## 2018-06-08 ENCOUNTER — Inpatient Hospital Stay (HOSPITAL_COMMUNITY): Payer: Medicare Other

## 2018-06-08 ENCOUNTER — Encounter (HOSPITAL_COMMUNITY): Payer: Self-pay | Admitting: Interventional Radiology

## 2018-06-08 DIAGNOSIS — I615 Nontraumatic intracerebral hemorrhage, intraventricular: Secondary | ICD-10-CM

## 2018-06-08 DIAGNOSIS — I609 Nontraumatic subarachnoid hemorrhage, unspecified: Secondary | ICD-10-CM

## 2018-06-08 DIAGNOSIS — J988 Other specified respiratory disorders: Secondary | ICD-10-CM

## 2018-06-08 DIAGNOSIS — L97909 Non-pressure chronic ulcer of unspecified part of unspecified lower leg with unspecified severity: Secondary | ICD-10-CM

## 2018-06-08 DIAGNOSIS — I611 Nontraumatic intracerebral hemorrhage in hemisphere, cortical: Secondary | ICD-10-CM

## 2018-06-08 DIAGNOSIS — L0390 Cellulitis, unspecified: Secondary | ICD-10-CM

## 2018-06-08 LAB — URINALYSIS, ROUTINE W REFLEX MICROSCOPIC
Bilirubin Urine: NEGATIVE
GLUCOSE, UA: NEGATIVE mg/dL
Hgb urine dipstick: NEGATIVE
Ketones, ur: 5 mg/dL — AB
Leukocytes, UA: NEGATIVE
NITRITE: NEGATIVE
Protein, ur: NEGATIVE mg/dL
Specific Gravity, Urine: 1.021 (ref 1.005–1.030)
pH: 6 (ref 5.0–8.0)

## 2018-06-08 LAB — LIPID PANEL
CHOL/HDL RATIO: 4.3 ratio
Cholesterol: 103 mg/dL (ref 0–200)
HDL: 24 mg/dL — AB (ref >40)
LDL CALC: 61 mg/dL (ref 0–99)
Triglycerides: 90 mg/dL (ref <150)
VLDL: 18 mg/dL (ref 0–40)

## 2018-06-08 LAB — URINALYSIS, COMPLETE (UACMP) WITH MICROSCOPIC
BILIRUBIN URINE: NEGATIVE
Glucose, UA: NEGATIVE mg/dL
KETONES UR: 20 mg/dL — AB
LEUKOCYTES UA: NEGATIVE
Nitrite: NEGATIVE
PH: 5 (ref 5.0–8.0)
Protein, ur: NEGATIVE mg/dL
SPECIFIC GRAVITY, URINE: 1.03 (ref 1.005–1.030)

## 2018-06-08 LAB — MRSA PCR SCREENING: MRSA BY PCR: NEGATIVE

## 2018-06-08 LAB — PREPARE CRYOPRECIPITATE
UNIT DIVISION: 0
Unit division: 0

## 2018-06-08 LAB — BASIC METABOLIC PANEL
ANION GAP: 9 (ref 5–15)
BUN: 17 mg/dL (ref 8–23)
CHLORIDE: 109 mmol/L (ref 98–111)
CO2: 22 mmol/L (ref 22–32)
Calcium: 7.9 mg/dL — ABNORMAL LOW (ref 8.9–10.3)
Creatinine, Ser: 0.79 mg/dL (ref 0.61–1.24)
GFR calc non Af Amer: >60 mL/min (ref >60)
Glucose, Bld: 109 mg/dL — ABNORMAL HIGH (ref 70–99)
Potassium: 3.8 mmol/L (ref 3.5–5.1)
Sodium: 140 mmol/L (ref 135–145)

## 2018-06-08 LAB — CBC
HCT: 29.3 % — ABNORMAL LOW (ref 39.0–52.0)
HEMOGLOBIN: 9.6 g/dL — AB (ref 13.0–17.0)
MCH: 31.3 pg (ref 26.0–34.0)
MCHC: 32.8 g/dL (ref 30.0–36.0)
MCV: 95.4 fL (ref 78.0–100.0)
Platelets: 407 10*3/uL — ABNORMAL HIGH (ref 150–400)
RBC: 3.07 MIL/uL — ABNORMAL LOW (ref 4.22–5.81)
RDW: 13.8 % (ref 11.5–15.5)
WBC: 12.7 10*3/uL — AB (ref 4.0–10.5)

## 2018-06-08 LAB — POCT I-STAT 3, ART BLOOD GAS (G3+)
Acid-base deficit: 3 mmol/L — ABNORMAL HIGH (ref 0.0–2.0)
Acid-base deficit: 3 mmol/L — ABNORMAL HIGH (ref 0.0–2.0)
Bicarbonate: 21.7 mmol/L (ref 20.0–28.0)
Bicarbonate: 20.9 mmol/L (ref 20.0–28.0)
O2 SAT: 99 %
O2 SAT: 99 %
PCO2 ART: 32.2 mmHg (ref 32.0–48.0)
PCO2 ART: 35.2 mmHg (ref 32.0–48.0)
PH ART: 7.398 (ref 7.350–7.450)
PH ART: 7.421 (ref 7.350–7.450)
TCO2: 22 mmol/L (ref 22–32)
TCO2: 23 mmol/L (ref 22–32)
pO2, Arterial: 161 mmHg — ABNORMAL HIGH (ref 83.0–108.0)
pO2, Arterial: 118 mmHg — ABNORMAL HIGH (ref 83.0–108.0)

## 2018-06-08 LAB — BPAM CRYOPRECIPITATE
Blood Product Expiration Date: 201909240153
Blood Product Expiration Date: 201909240153
ISSUE DATE / TIME: 201909232050
ISSUE DATE / TIME: 201909232050
UNIT TYPE AND RH: 6200
Unit Type and Rh: 6200

## 2018-06-08 LAB — HEMOGLOBIN A1C
Hgb A1c MFr Bld: 6.2 % — ABNORMAL HIGH (ref 4.8–5.6)
Mean Plasma Glucose: 131.24 mg/dL

## 2018-06-08 LAB — PATHOLOGIST SMEAR REVIEW

## 2018-06-08 LAB — FIBRINOGEN: FIBRINOGEN: 446 mg/dL (ref 210–475)

## 2018-06-08 MED ORDER — ORAL CARE MOUTH RINSE
15.0000 mL | OROMUCOSAL | Status: DC
  Administered 2018-06-08 – 2018-06-15 (×74): 15 mL via OROMUCOSAL

## 2018-06-08 MED ORDER — FAMOTIDINE IN NACL 20-0.9 MG/50ML-% IV SOLN
20.0000 mg | Freq: Two times a day (BID) | INTRAVENOUS | Status: DC
  Administered 2018-06-08 – 2018-06-10 (×5): 20 mg via INTRAVENOUS
  Filled 2018-06-08 (×4): qty 50

## 2018-06-08 MED ORDER — CHLORHEXIDINE GLUCONATE 0.12% ORAL RINSE (MEDLINE KIT)
15.0000 mL | Freq: Two times a day (BID) | OROMUCOSAL | Status: DC
  Administered 2018-06-08 – 2018-06-15 (×15): 15 mL via OROMUCOSAL

## 2018-06-08 MED ORDER — FAMOTIDINE 40 MG/5ML PO SUSR
20.0000 mg | Freq: Two times a day (BID) | ORAL | Status: DC
  Filled 2018-06-08: qty 2.5

## 2018-06-08 NOTE — Progress Notes (Signed)
Preliminary notes--Bilateral lower extremities venous duplex exam completed. Negative for DVT. Limited access for the exam, cannot completely exclude possible thrombosis where the segments not visualized. Justin [name] (RDMS RVT) 06/08/18 2:18 PM

## 2018-06-08 NOTE — Progress Notes (Signed)
Referring Physician(s): CODE STROKE  Supervising Physician: Gilmer Mor  Patient Status:  [facility] - In-pt  Chief Complaint: None  Subjective:  Right MCA M1 segment occlusion s/p revascularization using mechanical thrombectomy 06/12/2018 with Dr. Loreta Ave. Patient laying in bed intubated. Patient just returned from MRI scan. Spontaneously moving bilateral lower extremities, left upper extremity withdraws from pain, no movements of right upper extremity. Right groin incision c/d/i.   Allergies: Patient has no known allergies.  Medications: Prior to Admission medications   Medication Sig Start Date End Date Taking? Authorizing Provider  cephALEXin (KEFLEX) 500 MG capsule Take 1 capsule (500 mg total) by mouth 3 (three) times daily for 7 days. 06/02/18 06/09/18 Yes Amin, Loura Halt, MD  tamsulosin (FLOMAX) 0.4 MG CAPS capsule Take 1 capsule (0.4 mg total) by mouth daily. 06/03/18 07/03/18 Yes Amin, Loura Halt, MD     Vital Signs: BP 129/66   Pulse (!) 58   Temp 99.8 F (37.7 C) (Axillary)   Resp 19   Ht 5\' 10"  (1.778 m)   Wt 172 lb 2.9 oz (78.1 kg)   SpO2 100%   BMI 24.71 kg/m   Physical Exam  Constitutional: He appears well-developed and well-nourished. No distress.  Intubated.  Pulmonary/Chest: Effort normal. No respiratory distress.  Intubated.  Neurological:  Intubated without sedation. Does not respond to voice. PERRL bilaterally. EOMs not assessed. Visual fields not assessed. Unable to assess facial asymmetry due to ET tube. Unable to protrude tongue. Wiggles bilateral toes spontaneously, bilateral lower extremities and left upper extremity withdraws from pain, no movement of right upper extremity. Pronator drift not assessed. Fine motor and coordination not assessed. Gait not assessed. Romberg not assessed. Heel to toe not assessed. Distal pulses 2+ bilaterally.  Skin: Skin is warm and dry.  Right groin incision soft without active bleeding or  hematoma.  Psychiatric:  Intubated.  Nursing note and vitals reviewed.   Imaging: Ct Angio Head W Or Wo Contrast  Result Date: 06/03/2018 CLINICAL DATA:  Left-sided weakness and slurred speech. EXAM: CT ANGIOGRAPHY HEAD AND NECK TECHNIQUE: Multidetector CT imaging of the head and neck was performed using the standard protocol during bolus administration of intravenous contrast. Multiplanar CT image reconstructions and MIPs were obtained to evaluate the vascular anatomy. Carotid stenosis measurements (when applicable) are obtained utilizing NASCET criteria, using the distal internal carotid diameter as the denominator. CONTRAST:  50mL ISOVUE-370 IOPAMIDOL (ISOVUE-370) INJECTION 76% COMPARISON:  Head CT same day FINDINGS: CTA NECK FINDINGS AORTIC ARCH: There is mild calcific atherosclerosis of the aortic arch. There is no aneurysm, dissection or hemodynamically significant stenosis of the visualized ascending aorta and aortic arch. Conventional 3 vessel aortic branching pattern. The visualized proximal subclavian arteries are widely patent. RIGHT CAROTID SYSTEM: --Common carotid artery: Widely patent origin without common carotid artery dissection or aneurysm. --Internal carotid artery: No dissection, occlusion or aneurysm. No hemodynamically significant stenosis. --External carotid artery: No acute abnormality. LEFT CAROTID SYSTEM: --Common carotid artery: Widely patent origin without common carotid artery dissection or aneurysm. --Internal carotid artery:No dissection, occlusion or aneurysm. No hemodynamically significant stenosis. --External carotid artery: No acute abnormality. VERTEBRAL ARTERIES: Left dominant configuration. Both origins are normal. No dissection, occlusion or flow-limiting stenosis to the vertebrobasilar confluence. SKELETON: There is no bony spinal canal stenosis. No lytic or blastic lesion. OTHER NECK: Normal pharynx, larynx and major salivary glands. No cervical lymphadenopathy.  Unremarkable thyroid gland. UPPER CHEST: Biapical opacities most suggestive of fibrosis or scarring. A superimposed mass or consolidation with difficult  to exclude. CTA HEAD FINDINGS ANTERIOR CIRCULATION: --Intracranial internal carotid arteries: Normal. --Anterior cerebral arteries: Normal. Both A1 segments are present. Patent anterior communicating artery. --Middle cerebral arteries: Complete occlusion of the right middle cerebral artery M1 segment. No significant collateral flow within the right MCA territory. The left MCA is normal. --Posterior communicating arteries: Present on the right, absent on the left. POSTERIOR CIRCULATION: --Basilar artery: Normal. --Posterior cerebral arteries: Normal. --Superior cerebellar arteries: Normal. --Inferior cerebellar arteries: Normal anterior and posterior inferior cerebellar arteries. VENOUS SINUSES: As permitted by contrast timing, patent. ANATOMIC VARIANTS: None DELAYED PHASE: Not performed. Review of the MIP images confirms the above findings. IMPRESSION: 1. Emergent large vessel occlusion of the right middle cerebral artery M1 segment with minimal collateral flow demonstrated in the right MCA territory. 2. Biapical pulmonary opacities most consistent with fibrosis or scarring. A superimposed mass or consolidation would be difficult to exclude. If prior chest CTs exists, comparison would be helpful. 3. No arterial occlusion or hemodynamically significant stenosis of the major arteries of the neck. Critical Value/emergent results were called by telephone at the time of interpretation on 05/29/2018 at 4:27 pm to Dr. Caryl Pina, who verbally acknowledged these results. Electronically Signed   By: Deatra Robinson M.D.   On: 05/26/2018 16:43   Ct Head Wo Contrast  Result Date: 06/08/2018 CLINICAL DATA:  84 y/o  M; follow-up of intracranial hemorrhage. EXAM: CT HEAD WITHOUT CONTRAST TECHNIQUE: Contiguous axial images were obtained from the base of the skull through the  vertex without intravenous contrast. COMPARISON:  05/29/2018 CT head. FINDINGS: Brain: Multiple brain parenchymal hemorrhages within the paramedian frontal, parietal, and occipital lobes as well as the left temporal lobes with hematocrit levels. Large area of hemorrhage within the left paramedian posterior frontal cortex has decompressed into the ventricular system in the interim. Brain parenchymal hemorrhage is otherwise stable. Stable moderate volume of diffuse subarachnoid hemorrhage over cerebral convexities and cerebellum. Mild interval increase in intraventricular hemorrhage likely redistributed from the left frontal parenchymal hematoma as above within new blood fluid level in the right lateral ventricle atria. Ventricle size is stable. No new acute intracranial hemorrhage, stroke, or focal mass effect. No herniation. Stable background of chronic microvascular ischemic changes and volume loss of the brain. Vascular: Calcific atherosclerosis of the carotid siphons. Skull: Normal. Negative for fracture or focal lesion. Sinuses/Orbits: No acute finding. Other: None. IMPRESSION: 1. Interval decompression of left paramedian frontal lobe brain parenchymal hemorrhage into the ventricular system with increased blood products in both lateral ventricular atria. Stable ventricle size. 2. Otherwise stable brain parenchymal hemorrhage in paramedian frontal, parietal, and occipital lobes as well as the left temporal lobe. 3. Stable moderate volume of subarachnoid hemorrhage over convexities and cerebellum. 4. No new acute intracranial abnormality identified. Electronically Signed   By: Mitzi Hansen M.D.   On: 06/08/2018 00:54   Ct Head Wo Contrast  Result Date: 06/04/2018 CLINICAL DATA:  Stroke. Status post endovascular revascularization M1 occlusion and tPA. EXAM: CT HEAD WITHOUT CONTRAST TECHNIQUE: Contiguous axial images were obtained from the base of the skull through the vertex without intravenous  contrast. COMPARISON:  CT HEAD June 07, 2018 at 1607 hours and procedural CT HEAD at June 07, 2018 1828 hours. FINDINGS: BRAIN: Multiple intraparenchymal hematomas including bilateral frontal parietal lobes, LEFT temporal lobe and likely within vermis. Regional mass effect of frontoparietal hematomas flattening the lateral ventricles. LEFT intraventricular blood products with hematocrit level. Suspected hematocrit levels within the hematomas, however there may be a component of  contrast. Scattered extra-axial blood products predominately along the interhemispheric fissure and supra cerebellar cistern, cerebellar folia. Old LEFT occipital lobe infarct. No acute large vascular territory infarct. No hydrocephalus. Basal cisterns are patent. VASCULAR: Minimal calcific atherosclerosis of the carotid siphons. SKULL: No skull fracture. No significant scalp soft tissue swelling. SINUSES/ORBITS: Trace paranasal sinus mucosal thickening. Mastoid air cells are well aerated.The included ocular globes and orbital contents are non-suspicious. OTHER: None. IMPRESSION: 1. Multiple new intraparenchymal hematomas with hematocrit levels consistent with acute and suspected active hemorrhage. 2. LEFT lateral intraventricular hemorrhage, possible parenchymal extension. 3. Scattered extra-axial blood products, confounded by postcontrast imaging. 4. Critical Value/emergent results were called by telephone at the time of interpretation on 06/11/2018 at 7:39 pm to Dr. Wilford Corner, Neurology, who verbally acknowledged these results. Electronically Signed   By: Awilda Metro M.D.   On: 05/31/2018 19:40   Ct Angio Neck W Or Wo Contrast  Result Date: 06/01/2018 CLINICAL DATA:  Left-sided weakness and slurred speech. EXAM: CT ANGIOGRAPHY HEAD AND NECK TECHNIQUE: Multidetector CT imaging of the head and neck was performed using the standard protocol during bolus administration of intravenous contrast. Multiplanar CT image  reconstructions and MIPs were obtained to evaluate the vascular anatomy. Carotid stenosis measurements (when applicable) are obtained utilizing NASCET criteria, using the distal internal carotid diameter as the denominator. CONTRAST:  50mL ISOVUE-370 IOPAMIDOL (ISOVUE-370) INJECTION 76% COMPARISON:  Head CT same day FINDINGS: CTA NECK FINDINGS AORTIC ARCH: There is mild calcific atherosclerosis of the aortic arch. There is no aneurysm, dissection or hemodynamically significant stenosis of the visualized ascending aorta and aortic arch. Conventional 3 vessel aortic branching pattern. The visualized proximal subclavian arteries are widely patent. RIGHT CAROTID SYSTEM: --Common carotid artery: Widely patent origin without common carotid artery dissection or aneurysm. --Internal carotid artery: No dissection, occlusion or aneurysm. No hemodynamically significant stenosis. --External carotid artery: No acute abnormality. LEFT CAROTID SYSTEM: --Common carotid artery: Widely patent origin without common carotid artery dissection or aneurysm. --Internal carotid artery:No dissection, occlusion or aneurysm. No hemodynamically significant stenosis. --External carotid artery: No acute abnormality. VERTEBRAL ARTERIES: Left dominant configuration. Both origins are normal. No dissection, occlusion or flow-limiting stenosis to the vertebrobasilar confluence. SKELETON: There is no bony spinal canal stenosis. No lytic or blastic lesion. OTHER NECK: Normal pharynx, larynx and major salivary glands. No cervical lymphadenopathy. Unremarkable thyroid gland. UPPER CHEST: Biapical opacities most suggestive of fibrosis or scarring. A superimposed mass or consolidation with difficult to exclude. CTA HEAD FINDINGS ANTERIOR CIRCULATION: --Intracranial internal carotid arteries: Normal. --Anterior cerebral arteries: Normal. Both A1 segments are present. Patent anterior communicating artery. --Middle cerebral arteries: Complete occlusion of the  right middle cerebral artery M1 segment. No significant collateral flow within the right MCA territory. The left MCA is normal. --Posterior communicating arteries: Present on the right, absent on the left. POSTERIOR CIRCULATION: --Basilar artery: Normal. --Posterior cerebral arteries: Normal. --Superior cerebellar arteries: Normal. --Inferior cerebellar arteries: Normal anterior and posterior inferior cerebellar arteries. VENOUS SINUSES: As permitted by contrast timing, patent. ANATOMIC VARIANTS: None DELAYED PHASE: Not performed. Review of the MIP images confirms the above findings. IMPRESSION: 1. Emergent large vessel occlusion of the right middle cerebral artery M1 segment with minimal collateral flow demonstrated in the right MCA territory. 2. Biapical pulmonary opacities most consistent with fibrosis or scarring. A superimposed mass or consolidation would be difficult to exclude. If prior chest CTs exists, comparison would be helpful. 3. No arterial occlusion or hemodynamically significant stenosis of the major arteries of the neck. Critical Value/emergent  results were called by telephone at the time of interpretation on 06/10/2018 at 4:27 pm to Dr. Caryl Pina, who verbally acknowledged these results. Electronically Signed   By: Deatra Robinson M.D.   On: 05/20/2018 16:43   Ir Ct Head Ltd  Result Date: 06/08/2018 INDICATION: 84 year old male with a history of acute right MCA stroke, left-sided symptoms. Functional baseline with NIH stroke scale of 23 EXAM: ULTRASOUND GUIDED ACCESS RIGHT COMMON FEMORAL ARTERY CEREBRAL ANGIOGRAM MECHANICAL THROMBECTOMY FOR EMERGENT LARGE VESSEL OCCLUSION RIGHT MCA DEPLOYMENT ANGIO-SEAL FOR HEMOSTASIS LIMITED HEAD CT COMPARISON:  CT imaging same day 05/28/2018 MEDICATIONS: 2 g Ancef. The antibiotic was administered within 1 hour of the procedure ANESTHESIA/SEDATION: General endotracheal tube anesthesia The patient was also continuously monitored during the procedure by the  interventional radiology nurse under my direct supervision. CONTRAST:  100 cc Omni FLUOROSCOPY TIME:  Fluoroscopy Time: 35 minutes 30 seconds (1761 mGy). COMPLICATIONS: Intracranial hemorrhage identified on flat panel CT TECHNIQUE: Informed written consent was obtained from the patient's family after a thorough discussion of the procedural risks, benefits and alternatives. Specific risks discussed include: Bleeding, infection, contrast reaction, kidney injury/failure, need for further procedure/surgery, arterial injury or dissection, embolization to new territory, intracranial hemorrhage (10-15% risk), neurologic deterioration, cardiopulmonary collapse, death. All questions were addressed. Maximal Sterile Barrier Technique was utilized including during the procedure including caps, mask, sterile gowns, sterile gloves, sterile drape, hand hygiene and skin antiseptic. A timeout was performed prior to the initiation of the procedure. The anesthesia team was present to provide general endotracheal tube anesthesia and for patient monitoring during the procedure. Interventional neuro radiology nursing staff was also present. FINDINGS: Initial: Right common carotid artery:  Normal course caliber and contour. Right external carotid artery: Patent with antegrade flow. Right internal carotid artery: Normal course caliber and contour of the cervical portion. Vertical and petrous segment patent with normal course caliber contour. Cavernous segment patent. Clinoid segment patent. Antegrade flow of the ophthalmic artery. Ophthalmic segment patent. Terminus patent. Fetal right PCA. Right MCA: Acute cut off of the proximal M1 beyond the terminus, with no flow through the segment and no perfusion of the expected territory. Right ACA: A 1 segment patent. A 2 segment perfuses the right territory. Final: After 3 passes with mechanical thrombectomy, there is full restoration of flow through the target MCA segment, TICI 3 flow. Flat  panel CT: Flat panel CT demonstrates acute intracranial hemorrhage located away from the site of mechanical thrombectomy/embolectomy. No hemorrhage in the left sylvian region. Hemorrhage located in the pericallosal subarachnoid spaces and within the left para median frontal/parietal parenchyma. Additional site of hemorrhage within the left temporal lobe. Additional site of hemorrhage overlying the right frontal sulci within subarachnoid spaces. PROCEDURE: Patient is brought emergently to the neuro angiography suite, with the patient identified appropriately and placed supine position on the table. Left radial arterial line was placed by the anesthesia team. The patient is then prepped and draped in the usual sterile fashion. Ultrasound survey of the right inguinal region was performed with images stored and sent to PACs. 11 blade scalpel was used to make a small incision. Blunt dissection was performed. A micropuncture needle was used access the right common femoral artery under ultrasound. With excellent arterial blood flow returned, an .018 micro wire was passed through the needle, observed to enter the abdominal aorta under fluoroscopy. The needle was removed, and a micropuncture sheath was placed over the wire. The inner dilator and wire were removed, and an 035 Bentson wire  was advanced under fluoroscopy into the abdominal aorta. The sheath was removed and a standard 5 [place] vascular sheath was placed. The dilator was removed and the sheath was flushed. A 3F JB-1 diagnostic catheter was advanced over the wire to the proximal descending thoracic aorta. Wire was then removed. Double flush of the catheter was performed. Catheter was then used to select the right common carotid artery. Formal angiogram was performed, with roadmap achieved. Exchange length Rosen wire was then passed through the diagnostic catheter to the cervical ICA and the diagnostic catheter was removed. The 5 French sheath was removed and  exchanged for 8 French 55 centimeter BrightTip sheath. Sheath was flushed and attached to pressurized and heparinized saline bag for constant forward flow. Then a coaxial intermediate catheter and microcatheter combination was prepared on the back table. This combination was 115cm CAT-5 catheter and a Trevo Provue18 microcatheter, with a synchro soft wire. This combination was then advanced through the balloon guide into the ICA. System was advanced into the internal carotid artery, to the level of the occlusion. The micro wire was then carefully advanced through the occluded segment. Microcatheter was then pushed through the occluded segment and the wire was removed. Intermediate catheter was advanced over the micro wire to the M1 segment. Blood was then aspirated through the hub of the microcatheter, and a gentle contrast injection was performed confirming intraluminal position. A rotating hemostatic valve was then attached to the back end of the microcatheter, and a pressurized and heparinized saline bag was attached to the catheter. 5 x 33 Embotrap device was then selected. Back flush was achieved at the rotating hemostatic valve, and then the device was gently advanced through the microcatheter to the distal end. The retriever was then unsheathed by withdrawing the microcatheter under fluoroscopy. Once the retriever was completely unsheathed, the microcatheter was carefully stripped from the delivery wire of the device. Control angiogram was then performed from the balloon catheter. 3 minute time interval was observed. The balloon on the balloon guide was then inflated to profile of the vessel. Constant aspiration was then performed through the intermediate catheter, and constant gentle aspiration was performed at the balloon guide. This aspiration was continued as the retriever was gently and slowly withdrawn with fluoroscopic observation into the distal intermediate catheter, which given the length, was  within the intracranial ICA and not the MCA. The entire system was then gently withdrawn from the intracranial ICA and into the balloon guide. Once the retriever was entirely removed from the system, free aspiration was confirmed at the hub of the balloon guide, with free blood return confirmed. Control angiogram was then performed. Persistent occlusion at the proximal MCA was confirmed. The microcatheter and microwire were again used, with a new CAT 5 132 cm length intermediate catheter. Baseline angiogram demonstrated partial reperfusion at the site of the occlusion at this time,TICI 2a. Microwire and microcatheter were again advanced beyond the occlusion, the micro wire was removed and stentreiver device was deployed, stripping the microcatheter from the wire. After the device was deployed across the occlusion, the intermediate catheter was placed at the site of occlusion. The balloon at the balloon guide was inflated to profile of the vessel. Local aspiration was performed at the intermediate catheter upon withdrawal of the device under fluoroscopic observation, with aspiration at the balloon guide. Once the retrieve her was entirely removed from the system, free aspiration was confirmed at the hub of the intermediate catheter, with free blood return confirmed. Control angiogram was  again performed. Persistent occlusion at the dominant MCA branches confirmed, with unchanged TICI 2a flow. The microcatheter and microwire were again used, with the same CAT 5 132 cm length intermediate catheter. Microwire and microcatheter were again advanced beyond the occluded segments, the micro wire was removed and a new stent tree for device was selected. At this time we deployed a 4 mm x 40 mm solitaire platinum device, stripping the microcatheter from the delivery wire. After the device was deployed across the occlusion, the intermediate catheter was placed at the site of occlusion. 3 minutes interval was observed. The  balloon at the balloon guide was then inflated to profile of the vessel. At this time, the patient experienced asystole, treated by deflating the balloon for 2 minutes and with pharmacologic treatment from the anesthesia team. After recovery and treatment, the balloon was inflated again without incident. Local aspiration was performed at the intermediate catheter upon withdrawal of the device under fluoroscopic observation, with aspiration at the balloon guide. Once the retrieve her was entirely removed from the system, free aspiration was confirmed at the hub of the intermediate catheter, with free blood return confirmed Balloon guide was removed. Angio-Seal was deployed at the right common femoral artery. Flat panel CT was performed. Estimated blood loss approximately 50 cc. IMPRESSION: Status post ultrasound guided access right common femoral artery for cerebral angiogram and mechanical thrombectomy of acute emergent large vessel occlusion of the right MCA, achieving full TICI 3 reperfusion of the right MCA target vessel after 3 passes. Intracranial hemorrhage remote from the target vessel was identified on flat panel CT in the patient remained intubated. Signed, Yvone Neu. Reyne Dumas, RPVI [facility] Radiology PLAN: Patient remained intubated. Formal CT now Admit to neuro ICU NIR to follow Electronically Signed   By: Gilmer Mor D.O.   On: 06/08/2018 06:11   Ir US Guide Vasc Access Right  Result Date: 06/08/2018 INDICATION: 84 year old male with a history of acute right MCA stroke, left-sided symptoms. Functional baseline with NIH stroke scale of 23 EXAM: ULTRASOUND GUIDED ACCESS RIGHT COMMON FEMORAL ARTERY CEREBRAL ANGIOGRAM MECHANICAL THROMBECTOMY FOR EMERGENT LARGE VESSEL OCCLUSION RIGHT MCA DEPLOYMENT ANGIO-SEAL FOR HEMOSTASIS LIMITED HEAD CT COMPARISON:  CT imaging same day 07-02-2018 MEDICATIONS: 2 g Ancef. The antibiotic was administered within 1 hour  of the procedure ANESTHESIA/SEDATION: General endotracheal tube anesthesia The patient was also continuously monitored during the procedure by the interventional radiology nurse under my direct supervision. CONTRAST:  100 cc Omni FLUOROSCOPY TIME:  Fluoroscopy Time: 35 minutes 30 seconds (1761 mGy). COMPLICATIONS: Intracranial hemorrhage identified on flat panel CT TECHNIQUE: Informed written consent was obtained from the patient's family after a thorough discussion of the procedural risks, benefits and alternatives. Specific risks discussed include: Bleeding, infection, contrast reaction, kidney injury/failure, need for further procedure/surgery, arterial injury or dissection, embolization to new territory, intracranial hemorrhage (10-15% risk), neurologic deterioration, cardiopulmonary collapse, death. All questions were addressed. Maximal Sterile Barrier Technique was utilized including during the procedure including caps, mask, sterile gowns, sterile gloves, sterile drape, hand hygiene and skin antiseptic. A timeout was performed prior to the initiation of the procedure. The anesthesia team was present to provide general endotracheal tube anesthesia and for patient monitoring during the procedure. Interventional neuro radiology nursing staff was also present. FINDINGS: Initial: Right common carotid artery:  Normal course caliber and contour. Right external carotid artery: Patent with antegrade flow. Right internal carotid artery: Normal course caliber and contour of the cervical portion. Vertical and  petrous segment patent with normal course caliber contour. Cavernous segment patent. Clinoid segment patent. Antegrade flow of the ophthalmic artery. Ophthalmic segment patent. Terminus patent. Fetal right PCA. Right MCA: Acute cut off of the proximal M1 beyond the terminus, with no flow through the segment and no perfusion of the expected territory. Right ACA: A 1 segment patent. A 2 segment perfuses the right  territory. Final: After 3 passes with mechanical thrombectomy, there is full restoration of flow through the target MCA segment, TICI 3 flow. Flat panel CT: Flat panel CT demonstrates acute intracranial hemorrhage located away from the site of mechanical thrombectomy/embolectomy. No hemorrhage in the left sylvian region. Hemorrhage located in the pericallosal subarachnoid spaces and within the left para median frontal/parietal parenchyma. Additional site of hemorrhage within the left temporal lobe. Additional site of hemorrhage overlying the right frontal sulci within subarachnoid spaces. PROCEDURE: Patient is brought emergently to the neuro angiography suite, with the patient identified appropriately and placed supine position on the table. Left radial arterial line was placed by the anesthesia team. The patient is then prepped and draped in the usual sterile fashion. Ultrasound survey of the right inguinal region was performed with images stored and sent to PACs. 11 blade scalpel was used to make a small incision. Blunt dissection was performed. A micropuncture needle was used access the right common femoral artery under ultrasound. With excellent arterial blood flow returned, an .018 micro wire was passed through the needle, observed to enter the abdominal aorta under fluoroscopy. The needle was removed, and a micropuncture sheath was placed over the wire. The inner dilator and wire were removed, and an 035 Bentson wire was advanced under fluoroscopy into the abdominal aorta. The sheath was removed and a standard 5 [place] vascular sheath was placed. The dilator was removed and the sheath was flushed. A 46F JB-1 diagnostic catheter was advanced over the wire to the proximal descending thoracic aorta. Wire was then removed. Double flush of the catheter was performed. Catheter was then used to select the right common carotid artery. Formal angiogram was performed, with roadmap achieved. Exchange length Rosen wire was  then passed through the diagnostic catheter to the cervical ICA and the diagnostic catheter was removed. The 5 French sheath was removed and exchanged for 8 French 55 centimeter BrightTip sheath. Sheath was flushed and attached to pressurized and heparinized saline bag for constant forward flow. Then a coaxial intermediate catheter and microcatheter combination was prepared on the back table. This combination was 115cm CAT-5 catheter and a Trevo Provue18 microcatheter, with a synchro soft wire. This combination was then advanced through the balloon guide into the ICA. System was advanced into the internal carotid artery, to the level of the occlusion. The micro wire was then carefully advanced through the occluded segment. Microcatheter was then pushed through the occluded segment and the wire was removed. Intermediate catheter was advanced over the micro wire to the M1 segment. Blood was then aspirated through the hub of the microcatheter, and a gentle contrast injection was performed confirming intraluminal position. A rotating hemostatic valve was then attached to the back end of the microcatheter, and a pressurized and heparinized saline bag was attached to the catheter. 5 x 33 Embotrap device was then selected. Back flush was achieved at the rotating hemostatic valve, and then the device was gently advanced through the microcatheter to the distal end. The retriever was then unsheathed by withdrawing the microcatheter under fluoroscopy. Once the retriever was completely unsheathed, the microcatheter was  carefully stripped from the delivery wire of the device. Control angiogram was then performed from the balloon catheter. 3 minute time interval was observed. The balloon on the balloon guide was then inflated to profile of the vessel. Constant aspiration was then performed through the intermediate catheter, and constant gentle aspiration was performed at the balloon guide. This aspiration was continued as the  retriever was gently and slowly withdrawn with fluoroscopic observation into the distal intermediate catheter, which given the length, was within the intracranial ICA and not the MCA. The entire system was then gently withdrawn from the intracranial ICA and into the balloon guide. Once the retriever was entirely removed from the system, free aspiration was confirmed at the hub of the balloon guide, with free blood return confirmed. Control angiogram was then performed. Persistent occlusion at the proximal MCA was confirmed. The microcatheter and microwire were again used, with a new CAT 5 132 cm length intermediate catheter. Baseline angiogram demonstrated partial reperfusion at the site of the occlusion at this time,TICI 2a. Microwire and microcatheter were again advanced beyond the occlusion, the micro wire was removed and stentreiver device was deployed, stripping the microcatheter from the wire. After the device was deployed across the occlusion, the intermediate catheter was placed at the site of occlusion. The balloon at the balloon guide was inflated to profile of the vessel. Local aspiration was performed at the intermediate catheter upon withdrawal of the device under fluoroscopic observation, with aspiration at the balloon guide. Once the retrieve her was entirely removed from the system, free aspiration was confirmed at the hub of the intermediate catheter, with free blood return confirmed. Control angiogram was again performed. Persistent occlusion at the dominant MCA branches confirmed, with unchanged TICI 2a flow. The microcatheter and microwire were again used, with the same CAT 5 132 cm length intermediate catheter. Microwire and microcatheter were again advanced beyond the occluded segments, the micro wire was removed and a new stent tree for device was selected. At this time we deployed a 4 mm x 40 mm solitaire platinum device, stripping the microcatheter from the delivery wire. After the device  was deployed across the occlusion, the intermediate catheter was placed at the site of occlusion. 3 minutes interval was observed. The balloon at the balloon guide was then inflated to profile of the vessel. At this time, the patient experienced asystole, treated by deflating the balloon for 2 minutes and with pharmacologic treatment from the anesthesia team. After recovery and treatment, the balloon was inflated again without incident. Local aspiration was performed at the intermediate catheter upon withdrawal of the device under fluoroscopic observation, with aspiration at the balloon guide. Once the retrieve her was entirely removed from the system, free aspiration was confirmed at the hub of the intermediate catheter, with free blood return confirmed Balloon guide was removed. Angio-Seal was deployed at the right common femoral artery. Flat panel CT was performed. Estimated blood loss approximately 50 cc. IMPRESSION: Status post ultrasound guided access right common femoral artery for cerebral angiogram and mechanical thrombectomy of acute emergent large vessel occlusion of the right MCA, achieving full TICI 3 reperfusion of the right MCA target vessel after 3 passes. Intracranial hemorrhage remote from the target vessel was identified on flat panel CT in the patient remained intubated. Signed, Yvone NeuJaime S. Reyne DumasWagner, DO, RPVI [facility] PLAN: Patient remained intubated. Formal CT now Admit to neuro ICU NIR to follow Electronically Signed   By: Gilmer MorJaime  Wagner D.O.  On: 06/08/2018 06:11   Portable Chest Xray  Result Date: 06/08/2018 CLINICAL DATA:  Respiratory failure. EXAM: PORTABLE CHEST 1 VIEW COMPARISON:  Chest x-ray 05/30/2018.  CT 05/22/2018. FINDINGS: Endotracheal tube noted in stable position. Heart size normal. Bibasilar atelectasis/infiltrates. Biapical pleural-parenchymal thickening noted possibly related to scarring. Active infiltrates in these  regions cannot be excluded. IMPRESSION: 1.  Endotracheal tube in stable position. 2. Bibasilar atelectasis and infiltrates, new from prior. Biapical pleural-parenchymal thickening noted possibly related to scarring. Similar findings noted on prior exam. Active infiltrates in these regions cannot be excluded. Electronically Signed   By: Maisie Fus  Register   On: 06/08/2018 10:18   Dg Chest Port 1 View  Result Date: 06/03/2018 CLINICAL DATA:  84 y/o  M; assess ET tube position. EXAM: PORTABLE CHEST 1 VIEW COMPARISON:  None. FINDINGS: Normal cardiac silhouette. Aortic atherosclerosis with calcification. Endotracheal tube tip projects 4.7 cm above carina. No focal consolidation. No pleural effusion or pneumothorax. No acute osseous abnormality is evident. IMPRESSION: No active disease.  Endotracheal tube tip 4.8 cm above the carina. Electronically Signed   By: Mitzi Hansen M.D.   On: 06/02/2018 22:37   Ir Percutaneous Art Thrombectomy/infusion Intracranial Inc Diag Angio  Result Date: 06/08/2018 INDICATION: 84 year old male with a history of acute right MCA stroke, left-sided symptoms. Functional baseline with NIH stroke scale of 23 EXAM: ULTRASOUND GUIDED ACCESS RIGHT COMMON FEMORAL ARTERY CEREBRAL ANGIOGRAM MECHANICAL THROMBECTOMY FOR EMERGENT LARGE VESSEL OCCLUSION RIGHT MCA DEPLOYMENT ANGIO-SEAL FOR HEMOSTASIS LIMITED HEAD CT COMPARISON:  CT imaging same day 05/26/2018 MEDICATIONS: 2 g Ancef. The antibiotic was administered within 1 hour of the procedure ANESTHESIA/SEDATION: General endotracheal tube anesthesia The patient was also continuously monitored during the procedure by the interventional radiology nurse under my direct supervision. CONTRAST:  100 cc Omni FLUOROSCOPY TIME:  Fluoroscopy Time: 35 minutes 30 seconds (1761 mGy). COMPLICATIONS: Intracranial hemorrhage identified on flat panel CT TECHNIQUE: Informed written consent was obtained from the patient's family after a thorough discussion  of the procedural risks, benefits and alternatives. Specific risks discussed include: Bleeding, infection, contrast reaction, kidney injury/failure, need for further procedure/surgery, arterial injury or dissection, embolization to new territory, intracranial hemorrhage (10-15% risk), neurologic deterioration, cardiopulmonary collapse, death. All questions were addressed. Maximal Sterile Barrier Technique was utilized including during the procedure including caps, mask, sterile gowns, sterile gloves, sterile drape, hand hygiene and skin antiseptic. A timeout was performed prior to the initiation of the procedure. The anesthesia team was present to provide general endotracheal tube anesthesia and for patient monitoring during the procedure. Interventional neuro radiology nursing staff was also present. FINDINGS: Initial: Right common carotid artery:  Normal course caliber and contour. Right external carotid artery: Patent with antegrade flow. Right internal carotid artery: Normal course caliber and contour of the cervical portion. Vertical and petrous segment patent with normal course caliber contour. Cavernous segment patent. Clinoid segment patent. Antegrade flow of the ophthalmic artery. Ophthalmic segment patent. Terminus patent. Fetal right PCA. Right MCA: Acute cut off of the proximal M1 beyond the terminus, with no flow through the segment and no perfusion of the expected territory. Right ACA: A 1 segment patent. A 2 segment perfuses the right territory. Final: After 3 passes with mechanical thrombectomy, there is full restoration of flow through the target MCA segment, TICI 3 flow. Flat panel CT: Flat panel CT demonstrates acute intracranial hemorrhage located away from the site of mechanical thrombectomy/embolectomy. No hemorrhage in the left sylvian region. Hemorrhage located in the pericallosal subarachnoid spaces and within  the left para median frontal/parietal parenchyma. Additional site of hemorrhage  within the left temporal lobe. Additional site of hemorrhage overlying the right frontal sulci within subarachnoid spaces. PROCEDURE: Patient is brought emergently to the neuro angiography suite, with the patient identified appropriately and placed supine position on the table. Left radial arterial line was placed by the anesthesia team. The patient is then prepped and draped in the usual sterile fashion. Ultrasound survey of the right inguinal region was performed with images stored and sent to PACs. 11 blade scalpel was used to make a small incision. Blunt dissection was performed. A micropuncture needle was used access the right common femoral artery under ultrasound. With excellent arterial blood flow returned, an .018 micro wire was passed through the needle, observed to enter the abdominal aorta under fluoroscopy. The needle was removed, and a micropuncture sheath was placed over the wire. The inner dilator and wire were removed, and an 035 Bentson wire was advanced under fluoroscopy into the abdominal aorta. The sheath was removed and a standard 5 [place] vascular sheath was placed. The dilator was removed and the sheath was flushed. A 17F JB-1 diagnostic catheter was advanced over the wire to the proximal descending thoracic aorta. Wire was then removed. Double flush of the catheter was performed. Catheter was then used to select the right common carotid artery. Formal angiogram was performed, with roadmap achieved. Exchange length Rosen wire was then passed through the diagnostic catheter to the cervical ICA and the diagnostic catheter was removed. The 5 French sheath was removed and exchanged for 8 French 55 centimeter BrightTip sheath. Sheath was flushed and attached to pressurized and heparinized saline bag for constant forward flow. Then a coaxial intermediate catheter and microcatheter combination was prepared on the back table. This combination was 115cm CAT-5 catheter and a Trevo Provue18  microcatheter, with a synchro soft wire. This combination was then advanced through the balloon guide into the ICA. System was advanced into the internal carotid artery, to the level of the occlusion. The micro wire was then carefully advanced through the occluded segment. Microcatheter was then pushed through the occluded segment and the wire was removed. Intermediate catheter was advanced over the micro wire to the M1 segment. Blood was then aspirated through the hub of the microcatheter, and a gentle contrast injection was performed confirming intraluminal position. A rotating hemostatic valve was then attached to the back end of the microcatheter, and a pressurized and heparinized saline bag was attached to the catheter. 5 x 33 Embotrap device was then selected. Back flush was achieved at the rotating hemostatic valve, and then the device was gently advanced through the microcatheter to the distal end. The retriever was then unsheathed by withdrawing the microcatheter under fluoroscopy. Once the retriever was completely unsheathed, the microcatheter was carefully stripped from the delivery wire of the device. Control angiogram was then performed from the balloon catheter. 3 minute time interval was observed. The balloon on the balloon guide was then inflated to profile of the vessel. Constant aspiration was then performed through the intermediate catheter, and constant gentle aspiration was performed at the balloon guide. This aspiration was continued as the retriever was gently and slowly withdrawn with fluoroscopic observation into the distal intermediate catheter, which given the length, was within the intracranial ICA and not the MCA. The entire system was then gently withdrawn from the intracranial ICA and into the balloon guide. Once the retriever was entirely removed from the system, free aspiration was confirmed at the  hub of the balloon guide, with free blood return confirmed. Control angiogram was then  performed. Persistent occlusion at the proximal MCA was confirmed. The microcatheter and microwire were again used, with a new CAT 5 132 cm length intermediate catheter. Baseline angiogram demonstrated partial reperfusion at the site of the occlusion at this time,TICI 2a. Microwire and microcatheter were again advanced beyond the occlusion, the micro wire was removed and stentreiver device was deployed, stripping the microcatheter from the wire. After the device was deployed across the occlusion, the intermediate catheter was placed at the site of occlusion. The balloon at the balloon guide was inflated to profile of the vessel. Local aspiration was performed at the intermediate catheter upon withdrawal of the device under fluoroscopic observation, with aspiration at the balloon guide. Once the retrieve her was entirely removed from the system, free aspiration was confirmed at the hub of the intermediate catheter, with free blood return confirmed. Control angiogram was again performed. Persistent occlusion at the dominant MCA branches confirmed, with unchanged TICI 2a flow. The microcatheter and microwire were again used, with the same CAT 5 132 cm length intermediate catheter. Microwire and microcatheter were again advanced beyond the occluded segments, the micro wire was removed and a new stent tree for device was selected. At this time we deployed a 4 mm x 40 mm solitaire platinum device, stripping the microcatheter from the delivery wire. After the device was deployed across the occlusion, the intermediate catheter was placed at the site of occlusion. 3 minutes interval was observed. The balloon at the balloon guide was then inflated to profile of the vessel. At this time, the patient experienced asystole, treated by deflating the balloon for 2 minutes and with pharmacologic treatment from the anesthesia team. After recovery and treatment, the balloon was inflated again without incident. Local aspiration was  performed at the intermediate catheter upon withdrawal of the device under fluoroscopic observation, with aspiration at the balloon guide. Once the retrieve her was entirely removed from the system, free aspiration was confirmed at the hub of the intermediate catheter, with free blood return confirmed Balloon guide was removed. Angio-Seal was deployed at the right common femoral artery. Flat panel CT was performed. Estimated blood loss approximately 50 cc. IMPRESSION: Status post ultrasound guided access right common femoral artery for cerebral angiogram and mechanical thrombectomy of acute emergent large vessel occlusion of the right MCA, achieving full TICI 3 reperfusion of the right MCA target vessel after 3 passes. Intracranial hemorrhage remote from the target vessel was identified on flat panel CT in the patient remained intubated. Signed, Yvone Neu. Reyne Dumas, RPVI [facility] Radiology PLAN: Patient remained intubated. Formal CT now Admit to neuro ICU NIR to follow Electronically Signed   By: Gilmer Mor D.O.   On: 06/08/2018 06:11   Ct Head Code Stroke Wo Contrast  Result Date: 05/26/2018 CLINICAL DATA:  Code stroke.  Left-sided weakness and slurred speech EXAM: CT HEAD WITHOUT CONTRAST TECHNIQUE: Contiguous axial images were obtained from the base of the skull through the vertex without intravenous contrast. COMPARISON:  Head CT 05/28/2018 FINDINGS: Brain: There is no mass, hemorrhage or extra-axial collection. The size and configuration of the ventricles and extra-axial CSF spaces are normal. 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 No evidence of acute cortical infarct. There is hypoattenuation of the periventricular white matter, most commonly indicating chronic ischemic microangiopathy. Vascular: No abnormal hyperdensity of the major intracranial arteries or dural venous sinuses. No intracranial atherosclerosis. Skull: The  visualized skull base, calvarium  and extracranial soft tissues are normal. Sinuses/Orbits: No fluid levels or advanced mucosal thickening of the visualized paranasal sinuses. No mastoid or middle ear effusion. The orbits are normal. ASPECTS [facility] Stroke Program Early CT Score) - Ganglionic level infarction (caudate, lentiform nuclei, internal capsule, insula, M1-M3 cortex): 7 - Supraganglionic infarction (M4-M6 cortex): 3 Total score (0-10 with 10 being normal): 10 IMPRESSION: 1. No acute hemorrhage or mass lesion. 2. ASPECTS is 10. 3. 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 These results were called by telephone at the time of interpretation on 06/01/2018 at 4:09 pm to Dr. Caryl Pina , who verbally acknowledged these results. Electronically Signed   By: Deatra Robinson M.D.   On: 06/04/2018 16:24    Labs:  CBC: Recent Labs    05/30/18 0524 05/31/18 0528 05/30/2018 1600 06/12/2018 1606 06/08/18 0435  WBC 14.0* 12.0* 12.1*  --  12.7*  HGB 12.3* 12.1* 11.7* 12.2* 9.6*  HCT 36.0* 36.1* 35.9* 36.0* 29.3*  PLT 421* 401* 495*  --  407*    COAGS: Recent Labs    06/08/2018 1600  INR 1.26  APTT 32    BMP: Recent Labs    05/31/18 0528 06/02/18 0530 06/01/2018 1600 06/02/2018 1606 06/08/18 0435  NA 140 138 136 136 140  K 3.4* 3.3* 4.1 4.1 3.8  CL 108 106 100 103 109  CO2 21* 23 24  --  22  GLUCOSE 91 96 101* 94 109*  BUN 23 16 24* 27* 17  CALCIUM 8.1* 7.9* 8.5*  --  7.9*  CREATININE 0.83 0.67 0.95 0.90 0.79  GFRNONAA >60 >60 >60  --  >60  GFRAA >60 >60 >60  --  >60    LIVER FUNCTION TESTS: Recent Labs    05/28/18 0301 05/29/18 0519 05/30/2018 1600  BILITOT 0.9 0.5 0.9  AST 51* 42* 24  ALT 34 33 19  ALKPHOS 69 69 70  PROT 5.7* 5.2* 5.9*  ALBUMIN 2.6* 2.3* 2.9*    Assessment and Plan:  Right MCA M1 segment occlusion s/p revascularization using mechanical thrombectomy 05/17/2018 with Dr. Loreta Ave. Patient's condition stable- remains intubated, no movements of right upper  extremity. Awaiting MRI results. Right groin incision stable. Appreciate and agree with neurology management. IR to follow.   Electronically Signed: Elwin Mocha, PA-C 06/08/2018, 12:48 PM   I spent a total of 15 Minutes at the the patient's bedside AND on the patient's hospital floor or unit, greater than 50% of which was counseling/coordinating care for right MCA M1 segment occlusion s/p revascularization.

## 2018-06-08 NOTE — Progress Notes (Signed)
SLP Cancellation Note  Patient Details Name: Justin [name] MRN: [id number] DOB: 1927-12-27   Cancelled treatment:       Reason Eval/Treat Not Completed: Patient not medically ready   Justin [name], [name] 06/08/2018, 7:29 AM

## 2018-06-08 NOTE — Consult Note (Signed)
WOC Nurse wound consult note Reason for Consult: Chronic nonhealing wounds to bilateral lower legs, edema consistent with venous insufficiency but no ABI studies on file.  Legs are erythematous and oozing from scattered non intact lesions.  This was present last hospitalization.  Will continue antimicrobial topical therapy to lower legs.  Wound type:chronic nonhealing Pressure Injury POA: NA Measurement: scattered nonintact 1 cm lesions to bilateral lower legs.   Wound ZOX:WRUEAbed:Ruddy red, some fibrin slough noted Drainage (amount, consistency, odor) moderate serosanguinous   No odor Periwound:erythema and edema Dressing procedure/placement/frequency: Cleanse bilateral lower legs with soap and water and pat dry.  Apply Aquacel Ag to nonintact areas.  Wrap with kerlix and tape.  Change Tuesday and Friday.  Will not follow at this time.  Please re-consult if needed.  Maple HudsonKaren Edie Darley MSN, RN, FNP-BC CWON Wound, Ostomy, Continence Nurse Pager [phone]

## 2018-06-08 NOTE — Progress Notes (Addendum)
Initial Nutrition Assessment  DOCUMENTATION CODES:   Not applicable  INTERVENTION:   Once NGT/OGT placed:   Vital AF 1.2 @ 60 ml/hr (1440 ml)  Provides: 1728 kcals, 108 grams protein, 1166 ml free water. Meets 100% of needs.   NUTRITION DIAGNOSIS:   Increased nutrient needs related to wound healing as evidenced by estimated needs  GOAL:   Patient will meet greater than or equal to 90% of their needs  MONITOR:   Diet advancement, Vent status, Labs, Weight trends, TF tolerance, I & O's, Skin  REASON FOR ASSESSMENT:   Ventilator    ASSESSMENT:   Patient with PMH significant for venous stasis ulcers of both lower extremities and erysipelas. [facility] cellul[facility]. Presents this admission with aphagia, right gaze, and left hemiparesis. Admitted for right MCA M1 occlusion.    9/23- TPA administered, IR mechanical thrombectomy, intubated  Pt discussed with RN. No OGT/NGT in place at this time due to TPA administration. Possible placement tonight if pt remains intubated. Pt not able to wean this morning. Off cleviprex. RD to leave TF recommendations.   Patient is currently intubated on ventilator support MV: 7.5 L/min Temp (24hrs), Avg:97.9 F (36.6 C), Min:94 F (34.4 C), Max:99.8 F (37.7 C) BP: 132/64 MAP: 84  I/O: + 1.5 L since admit UOP: 1.9 L x 24 hrs  Weight records are limited but show pt weighed 165 lb on 9/13 and 172 lb this admission. Will continue to monitor trends.   A limited Nutrition-Focused physical exam was completed. Pt's legs covered and mits secured on both hands at this time. Will re-attempt exam if possible.   Medications reviewed and include: NS @ 75 ml/hr, neosynephrine Labs reviewed.   NUTRITION - FOCUSED PHYSICAL EXAM:    Most Recent Value  Orbital Region  No depletion  Upper Arm Region  Mild depletion  Thoracic and Lumbar Region  Unable to assess  Buccal Region  Unable to assess  Temple Region  Severe  depletion  Clavicle Bone Region  Moderate depletion  Clavicle and Acromion Bone Region  Moderate depletion  Scapular Bone Region  Unable to assess  Dorsal Hand  Unable to assess [mits]  Patellar Region  Unable to assess  Anterior Thigh Region  Unable to assess  Posterior Calf Region  Unable to assess  Edema (RD Assessment)  Unable to assess     Diet Order:   Diet Order            Diet NPO time specified  Diet effective now              EDUCATION NEEDS:   Not appropriate for education at this time  Skin:  Skin Assessment: Skin Integrity Issues: Skin Integrity Issues:: Other (Comment), Stage II Stage II: buttocks Other: bilateral venous stasis ulcers   Last BM:  PTA  Height:   Ht Readings from Last 1 Encounters:  05/29/2018 5\' 10"  (1.778 m)    Weight:   Wt Readings from Last 1 Encounters:  05/29/2018 78.1 kg    Ideal Body Weight:  75.5 kg  BMI:  Body mass index is 24.71 kg/m.  Estimated Nutritional Needs:   Kcal:  1715 kcal  Protein:  105-120 grams  Fluid:  >/= 1.7 L/day  Vanessa Kickarly Montina Dorrance RD, LDN Clinical Nutrition Pager # - [phone]

## 2018-06-08 NOTE — Progress Notes (Signed)
PT Cancellation Note  Patient Details Name: Justin [name] MRN: [id number] DOB: 1928/08/22   Cancelled Treatment:    Reason Eval/Treat Not Completed: Other (comment)(Order discontinued).  Noted order for PT discontinued.  Please reorder with activity order update if/when pt becomes appropriate for therapies. 06/08/2018  [name], PT Acute Rehabilitation Services [phone]  (pager) [phone]  (office)   [name] 06/08/2018, 12:24 PM

## 2018-06-08 NOTE — Progress Notes (Addendum)
STROKE TEAM PROGRESS NOTE   SUBJECTIVE (INTERVAL HISTORY) His family is at the bedside.  I talked to wife over the phone and got more history. He was admitted in [facility] on 9/13 and discharged on 9/18 for LE cellulitis and his Xarelto was stopped on discharge. He had foley catheter placed due to BPH on discharge. He supposed to be on kflex at home but he could not find the medication. Currently he was intubated, not on sedation. Slightly open eyes on pain, off cleviprex and now on Neo. Wound care on board, will have LE venous doppler done.    OBJECTIVE Temp:  [94 F (34.4 C)-99.2 F (37.3 C)] 99.2 F (37.3 C) (09/24 0400) Pulse Rate:  [25-93] 75 (09/24 0744) Cardiac Rhythm: Normal sinus rhythm (09/24 0800) Resp:  [12-19] 16 (09/24 0744) BP: (104-151)/(56-102) 109/102 (09/24 0744) SpO2:  [97 %-100 %] 99 % (09/24 0700) Arterial Line BP: (86-167)/(48-101) 89/79 (09/24 0700) FiO2 (%):  [30 %-40 %] 30 % (09/24 0744) Weight:  [78.1 kg] 78.1 kg (09/23 1617)  No results for input(s): GLUCAP in the last 168 hours. Recent Labs  Lab 06/02/18 0530 05/31/2018 1600 05/30/2018 1606 06/09/2018 2052 06/08/18 0435  NA 138 136 136  --  140  K 3.3* 4.1 4.1  --  3.8  CL 106 100 103  --  109  CO2 23 24  --   --  22  GLUCOSE 96 101* 94  --  109*  BUN 16 24* 27*  --  17  CREATININE 0.67 0.95 0.90  --  0.79  CALCIUM 7.9* 8.5*  --   --  7.9*  MG  --   --   --  1.8  --   PHOS  --   --   --  3.9  --    Recent Labs  Lab 05/28/2018 1600  AST 24  ALT 19  ALKPHOS 70  BILITOT 0.9  PROT 5.9*  ALBUMIN 2.9*   Recent Labs  Lab 05/17/2018 1600 05/25/2018 1606 06/08/18 0435  WBC 12.1*  --  12.7*  NEUTROABS 5.8  --   --   HGB 11.7* 12.2* 9.6*  HCT 35.9* 36.0* 29.3*  MCV 96.0  --  95.4  PLT 495*  --  407*   No results for input(s): CKTOTAL, CKMB, CKMBINDEX, TROPONINI in the last 168 hours. Recent Labs    05/22/2018 1600  LABPROT 15.7*  INR 1.26   Recent Labs    05/29/2018 2342  COLORURINE STRAW*  LABSPEC  1.021  PHURINE 6.0  GLUCOSEU NEGATIVE  HGBUR NEGATIVE  BILIRUBINUR NEGATIVE  KETONESUR 5*  PROTEINUR NEGATIVE  NITRITE NEGATIVE  LEUKOCYTESUR NEGATIVE       Component Value Date/Time   CHOL 103 06/08/2018 0435   TRIG 90 06/08/2018 0435   HDL 24 (L) 06/08/2018 0435   CHOLHDL 4.3 06/08/2018 0435   VLDL 18 06/08/2018 0435   LDLCALC 61 06/08/2018 0435   Lab Results  Component Value Date   HGBA1C 6.2 (H) 06/08/2018      Component Value Date/Time   LABOPIA NONE DETECTED 09/26/2012 0034   COCAINSCRNUR NONE DETECTED 09/26/2012 0034   LABBENZ NONE DETECTED 09/26/2012 0034   AMPHETMU NONE DETECTED 09/26/2012 0034   THCU NONE DETECTED 09/26/2012 0034   LABBARB NONE DETECTED 09/26/2012 0034    No results for input(s): ETH in the last 168 hours.  I have personally reviewed the radiological images below and agree with the radiology interpretations.  Ct Angio Head W Or  Wo Contrast  Result Date: 05/21/2018 CLINICAL DATA:  Left-sided weakness and slurred speech. EXAM: CT ANGIOGRAPHY HEAD AND NECK TECHNIQUE: Multidetector CT imaging of the head and neck was performed using the standard protocol during bolus administration of intravenous contrast. Multiplanar CT image reconstructions and MIPs were obtained to evaluate the vascular anatomy. Carotid stenosis measurements (when applicable) are obtained utilizing NASCET criteria, using the distal internal carotid diameter as the denominator. CONTRAST:  50mL ISOVUE-370 IOPAMIDOL (ISOVUE-370) INJECTION 76% COMPARISON:  Head CT same day FINDINGS: CTA NECK FINDINGS AORTIC ARCH: There is mild calcific atherosclerosis of the aortic arch. There is no aneurysm, dissection or hemodynamically significant stenosis of the visualized ascending aorta and aortic arch. Conventional 3 vessel aortic branching pattern. The visualized proximal subclavian arteries are widely patent. RIGHT CAROTID SYSTEM: --Common carotid artery: Widely patent origin without common carotid  artery dissection or aneurysm. --Internal carotid artery: No dissection, occlusion or aneurysm. No hemodynamically significant stenosis. --External carotid artery: No acute abnormality. LEFT CAROTID SYSTEM: --Common carotid artery: Widely patent origin without common carotid artery dissection or aneurysm. --Internal carotid artery:No dissection, occlusion or aneurysm. No hemodynamically significant stenosis. --External carotid artery: No acute abnormality. VERTEBRAL ARTERIES: Left dominant configuration. Both origins are normal. No dissection, occlusion or flow-limiting stenosis to the vertebrobasilar confluence. SKELETON: There is no bony spinal canal stenosis. No lytic or blastic lesion. OTHER NECK: Normal pharynx, larynx and major salivary glands. No cervical lymphadenopathy. Unremarkable thyroid gland. UPPER CHEST: Biapical opacities most suggestive of fibrosis or scarring. A superimposed mass or consolidation with difficult to exclude. CTA HEAD FINDINGS ANTERIOR CIRCULATION: --Intracranial internal carotid arteries: Normal. --Anterior cerebral arteries: Normal. Both A1 segments are present. Patent anterior communicating artery. --Middle cerebral arteries: Complete occlusion of the right middle cerebral artery M1 segment. No significant collateral flow within the right MCA territory. The left MCA is normal. --Posterior communicating arteries: Present on the right, absent on the left. POSTERIOR CIRCULATION: --Basilar artery: Normal. --Posterior cerebral arteries: Normal. --Superior cerebellar arteries: Normal. --Inferior cerebellar arteries: Normal anterior and posterior inferior cerebellar arteries. VENOUS SINUSES: As permitted by contrast timing, patent. ANATOMIC VARIANTS: None DELAYED PHASE: Not performed. Review of the MIP images confirms the above findings. IMPRESSION: 1. Emergent large vessel occlusion of the right middle cerebral artery M1 segment with minimal collateral flow demonstrated in the right MCA  territory. 2. Biapical pulmonary opacities most consistent with fibrosis or scarring. A superimposed mass or consolidation would be difficult to exclude. If prior chest CTs exists, comparison would be helpful. 3. No arterial occlusion or hemodynamically significant stenosis of the major arteries of the neck. Critical Value/emergent results were called by telephone at the time of interpretation on 06/05/2018 at 4:27 pm to Dr. Caryl Pina, who verbally acknowledged these results. Electronically Signed   By: Deatra Robinson M.D.   On: 05/17/2018 16:43   Ct Head Wo Contrast  Result Date: 06/08/2018 CLINICAL DATA:  84 y/o  M; follow-up of intracranial hemorrhage. EXAM: CT HEAD WITHOUT CONTRAST TECHNIQUE: Contiguous axial images were obtained from the base of the skull through the vertex without intravenous contrast. COMPARISON:  06/13/2018 CT head. FINDINGS: Brain: Multiple brain parenchymal hemorrhages within the paramedian frontal, parietal, and occipital lobes as well as the left temporal lobes with hematocrit levels. Large area of hemorrhage within the left paramedian posterior frontal cortex has decompressed into the ventricular system in the interim. Brain parenchymal hemorrhage is otherwise stable. Stable moderate volume of diffuse subarachnoid hemorrhage over cerebral convexities and cerebellum. Mild interval increase in intraventricular  hemorrhage likely redistributed from the left frontal parenchymal hematoma as above within new blood fluid level in the right lateral ventricle atria. Ventricle size is stable. No new acute intracranial hemorrhage, stroke, or focal mass effect. No herniation. Stable background of chronic microvascular ischemic changes and volume loss of the brain. Vascular: Calcific atherosclerosis of the carotid siphons. Skull: Normal. Negative for fracture or focal lesion. Sinuses/Orbits: No acute finding. Other: None. IMPRESSION: 1. Interval decompression of left paramedian frontal lobe  brain parenchymal hemorrhage into the ventricular system with increased blood products in both lateral ventricular atria. Stable ventricle size. 2. Otherwise stable brain parenchymal hemorrhage in paramedian frontal, parietal, and occipital lobes as well as the left temporal lobe. 3. Stable moderate volume of subarachnoid hemorrhage over convexities and cerebellum. 4. No new acute intracranial abnormality identified. Electronically Signed   By: Mitzi Hansen M.D.   On: 06/08/2018 00:54   Ct Head Wo Contrast  Result Date: 07-Jul-2018 CLINICAL DATA:  Stroke. Status post endovascular revascularization M1 occlusion and tPA. EXAM: CT HEAD WITHOUT CONTRAST TECHNIQUE: Contiguous axial images were obtained from the base of the skull through the vertex without intravenous contrast. COMPARISON:  CT HEAD 2018/07/07 at 1607 hours and procedural CT HEAD at 2018-07-07 1828 hours. FINDINGS: BRAIN: Multiple intraparenchymal hematomas including bilateral frontal parietal lobes, LEFT temporal lobe and likely within vermis. Regional mass effect of frontoparietal hematomas flattening the lateral ventricles. LEFT intraventricular blood products with hematocrit level. Suspected hematocrit levels within the hematomas, however there may be a component of contrast. Scattered extra-axial blood products predominately along the interhemispheric fissure and supra cerebellar cistern, cerebellar folia. Old LEFT occipital lobe infarct. No acute large vascular territory infarct. No hydrocephalus. Basal cisterns are patent. VASCULAR: Minimal calcific atherosclerosis of the carotid siphons. SKULL: No skull fracture. No significant scalp soft tissue swelling. SINUSES/ORBITS: Trace paranasal sinus mucosal thickening. Mastoid air cells are well aerated.The included ocular globes and orbital contents are non-suspicious. OTHER: None. IMPRESSION: 1. Multiple new intraparenchymal hematomas with hematocrit levels consistent with  acute and suspected active hemorrhage. 2. LEFT lateral intraventricular hemorrhage, possible parenchymal extension. 3. Scattered extra-axial blood products, confounded by postcontrast imaging. 4. Critical Value/emergent results were called by telephone at the time of interpretation on 2018/07/07 at 7:39 pm to Dr. Wilford Corner, Neurology, who verbally acknowledged these results. Electronically Signed   By: Awilda Metro M.D.   On: 2018/07/07 19:40   Ct Head Wo Contrast  Result Date: 05/28/2018 CLINICAL DATA:  Visual loss or uveitis/scleritis EXAM: CT HEAD WITHOUT CONTRAST TECHNIQUE: Contiguous axial images were obtained from the base of the skull through the vertex without intravenous contrast. COMPARISON:  Remote head CT 06/02/2005 FINDINGS: Brain: Progressive atrophy and chronic small vessel ischemia from 2006 exam. No intracranial hemorrhage, mass effect, or midline shift. No hydrocephalus. The basilar cisterns are patent. No evidence of territorial infarct or acute ischemia. No extra-axial or intracranial fluid collection. Vascular: No hyperdense vessel. Skull: No fracture or focal lesion. Sinuses/Orbits: Paranasal sinuses and mastoid air cells are clear. The visualized orbits are unremarkable. Other: None. IMPRESSION: 1.  No acute intracranial abnormality. 2. Atrophy with moderate to advanced chronic small vessel ischemia. Electronically Signed   By: Narda Rutherford M.D.   On: 05/28/2018 03:53   Ct Angio Neck W Or Wo Contrast  Result Date: 07-07-18 CLINICAL DATA:  Left-sided weakness and slurred speech. EXAM: CT ANGIOGRAPHY HEAD AND NECK TECHNIQUE: Multidetector CT imaging of the head and neck was performed using the standard protocol during  bolus administration of intravenous contrast. Multiplanar CT image reconstructions and MIPs were obtained to evaluate the vascular anatomy. Carotid stenosis measurements (when applicable) are obtained utilizing NASCET criteria, using the distal internal carotid  diameter as the denominator. CONTRAST:  50mL ISOVUE-370 IOPAMIDOL (ISOVUE-370) INJECTION 76% COMPARISON:  Head CT same day FINDINGS: CTA NECK FINDINGS AORTIC ARCH: There is mild calcific atherosclerosis of the aortic arch. There is no aneurysm, dissection or hemodynamically significant stenosis of the visualized ascending aorta and aortic arch. Conventional 3 vessel aortic branching pattern. The visualized proximal subclavian arteries are widely patent. RIGHT CAROTID SYSTEM: --Common carotid artery: Widely patent origin without common carotid artery dissection or aneurysm. --Internal carotid artery: No dissection, occlusion or aneurysm. No hemodynamically significant stenosis. --External carotid artery: No acute abnormality. LEFT CAROTID SYSTEM: --Common carotid artery: Widely patent origin without common carotid artery dissection or aneurysm. --Internal carotid artery:No dissection, occlusion or aneurysm. No hemodynamically significant stenosis. --External carotid artery: No acute abnormality. VERTEBRAL ARTERIES: Left dominant configuration. Both origins are normal. No dissection, occlusion or flow-limiting stenosis to the vertebrobasilar confluence. SKELETON: There is no bony spinal canal stenosis. No lytic or blastic lesion. OTHER NECK: Normal pharynx, larynx and major salivary glands. No cervical lymphadenopathy. Unremarkable thyroid gland. UPPER CHEST: Biapical opacities most suggestive of fibrosis or scarring. A superimposed mass or consolidation with difficult to exclude. CTA HEAD FINDINGS ANTERIOR CIRCULATION: --Intracranial internal carotid arteries: Normal. --Anterior cerebral arteries: Normal. Both A1 segments are present. Patent anterior communicating artery. --Middle cerebral arteries: Complete occlusion of the right middle cerebral artery M1 segment. No significant collateral flow within the right MCA territory. The left MCA is normal. --Posterior communicating arteries: Present on the right, absent on  the left. POSTERIOR CIRCULATION: --Basilar artery: Normal. --Posterior cerebral arteries: Normal. --Superior cerebellar arteries: Normal. --Inferior cerebellar arteries: Normal anterior and posterior inferior cerebellar arteries. VENOUS SINUSES: As permitted by contrast timing, patent. ANATOMIC VARIANTS: None DELAYED PHASE: Not performed. Review of the MIP images confirms the above findings. IMPRESSION: 1. Emergent large vessel occlusion of the right middle cerebral artery M1 segment with minimal collateral flow demonstrated in the right MCA territory. 2. Biapical pulmonary opacities most consistent with fibrosis or scarring. A superimposed mass or consolidation would be difficult to exclude. If prior chest CTs exists, comparison would be helpful. 3. No arterial occlusion or hemodynamically significant stenosis of the major arteries of the neck. Critical Value/emergent results were called by telephone at the time of interpretation on 28-Sep-2017 at 4:27 pm to Dr. Caryl PinaEric Lindzen, who verbally acknowledged these results. Electronically Signed   By: Deatra RobinsonKevin  Herman M.D.   On: 014-Jan-2019 16:43   Ir Ct Head Ltd  Result Date: 06/08/2018 INDICATION: 84 year old male with a history of acute right MCA stroke, left-sided symptoms. Functional baseline with NIH stroke scale of 23 EXAM: ULTRASOUND GUIDED ACCESS RIGHT COMMON FEMORAL ARTERY CEREBRAL ANGIOGRAM MECHANICAL THROMBECTOMY FOR EMERGENT LARGE VESSEL OCCLUSION RIGHT MCA DEPLOYMENT ANGIO-SEAL FOR HEMOSTASIS LIMITED HEAD CT COMPARISON:  CT imaging same day 014-Jan-2019 MEDICATIONS: 2 g Ancef. The antibiotic was administered within 1 hour of the procedure ANESTHESIA/SEDATION: General endotracheal tube anesthesia The patient was also continuously monitored during the procedure by the interventional radiology nurse under my direct supervision. CONTRAST:  100 cc Omni FLUOROSCOPY TIME:  Fluoroscopy Time: 35 minutes 30 seconds (1761 mGy). COMPLICATIONS: Intracranial hemorrhage  identified on flat panel CT TECHNIQUE: Informed written consent was obtained from the patient's family after a thorough discussion of the procedural risks, benefits and alternatives. Specific risks discussed include: Bleeding,  infection, contrast reaction, kidney injury/failure, need for further procedure/surgery, arterial injury or dissection, embolization to new territory, intracranial hemorrhage (10-15% risk), neurologic deterioration, cardiopulmonary collapse, death. All questions were addressed. Maximal Sterile Barrier Technique was utilized including during the procedure including caps, mask, sterile gowns, sterile gloves, sterile drape, hand hygiene and skin antiseptic. A timeout was performed prior to the initiation of the procedure. The anesthesia team was present to provide general endotracheal tube anesthesia and for patient monitoring during the procedure. Interventional neuro radiology nursing staff was also present. FINDINGS: Initial: Right common carotid artery:  Normal course caliber and contour. Right external carotid artery: Patent with antegrade flow. Right internal carotid artery: Normal course caliber and contour of the cervical portion. Vertical and petrous segment patent with normal course caliber contour. Cavernous segment patent. Clinoid segment patent. Antegrade flow of the ophthalmic artery. Ophthalmic segment patent. Terminus patent. Fetal right PCA. Right MCA: Acute cut off of the proximal M1 beyond the terminus, with no flow through the segment and no perfusion of the expected territory. Right ACA: A 1 segment patent. A 2 segment perfuses the right territory. Final: After 3 passes with mechanical thrombectomy, there is full restoration of flow through the target MCA segment, TICI 3 flow. Flat panel CT: Flat panel CT demonstrates acute intracranial hemorrhage located away from the site of mechanical thrombectomy/embolectomy. No hemorrhage in the left sylvian region. Hemorrhage located in  the pericallosal subarachnoid spaces and within the left para median frontal/parietal parenchyma. Additional site of hemorrhage within the left temporal lobe. Additional site of hemorrhage overlying the right frontal sulci within subarachnoid spaces. PROCEDURE: Patient is brought emergently to the neuro angiography suite, with the patient identified appropriately and placed supine position on the table. Left radial arterial line was placed by the anesthesia team. The patient is then prepped and draped in the usual sterile fashion. Ultrasound survey of the right inguinal region was performed with images stored and sent to PACs. 11 blade scalpel was used to make a small incision. Blunt dissection was performed. A micropuncture needle was used access the right common femoral artery under ultrasound. With excellent arterial blood flow returned, an .018 micro wire was passed through the needle, observed to enter the abdominal aorta under fluoroscopy. The needle was removed, and a micropuncture sheath was placed over the wire. The inner dilator and wire were removed, and an 035 Bentson wire was advanced under fluoroscopy into the abdominal aorta. The sheath was removed and a standard 5 [place] vascular sheath was placed. The dilator was removed and the sheath was flushed. A 66F JB-1 diagnostic catheter was advanced over the wire to the proximal descending thoracic aorta. Wire was then removed. Double flush of the catheter was performed. Catheter was then used to select the right common carotid artery. Formal angiogram was performed, with roadmap achieved. Exchange length Rosen wire was then passed through the diagnostic catheter to the cervical ICA and the diagnostic catheter was removed. The 5 French sheath was removed and exchanged for 8 French 55 centimeter BrightTip sheath. Sheath was flushed and attached to pressurized and heparinized saline bag for constant forward flow. Then a coaxial intermediate catheter and  microcatheter combination was prepared on the back table. This combination was 115cm CAT-5 catheter and a Trevo Provue18 microcatheter, with a synchro soft wire. This combination was then advanced through the balloon guide into the ICA. System was advanced into the internal carotid artery, to the level of the occlusion. The micro wire was then carefully advanced  through the occluded segment. Microcatheter was then pushed through the occluded segment and the wire was removed. Intermediate catheter was advanced over the micro wire to the M1 segment. Blood was then aspirated through the hub of the microcatheter, and a gentle contrast injection was performed confirming intraluminal position. A rotating hemostatic valve was then attached to the back end of the microcatheter, and a pressurized and heparinized saline bag was attached to the catheter. 5 x 33 Embotrap device was then selected. Back flush was achieved at the rotating hemostatic valve, and then the device was gently advanced through the microcatheter to the distal end. The retriever was then unsheathed by withdrawing the microcatheter under fluoroscopy. Once the retriever was completely unsheathed, the microcatheter was carefully stripped from the delivery wire of the device. Control angiogram was then performed from the balloon catheter. 3 minute time interval was observed. The balloon on the balloon guide was then inflated to profile of the vessel. Constant aspiration was then performed through the intermediate catheter, and constant gentle aspiration was performed at the balloon guide. This aspiration was continued as the retriever was gently and slowly withdrawn with fluoroscopic observation into the distal intermediate catheter, which given the length, was within the intracranial ICA and not the MCA. The entire system was then gently withdrawn from the intracranial ICA and into the balloon guide. Once the retriever was entirely removed from the system,  free aspiration was confirmed at the hub of the balloon guide, with free blood return confirmed. Control angiogram was then performed. Persistent occlusion at the proximal MCA was confirmed. The microcatheter and microwire were again used, with a new CAT 5 132 cm length intermediate catheter. Baseline angiogram demonstrated partial reperfusion at the site of the occlusion at this time,TICI 2a. Microwire and microcatheter were again advanced beyond the occlusion, the micro wire was removed and stentreiver device was deployed, stripping the microcatheter from the wire. After the device was deployed across the occlusion, the intermediate catheter was placed at the site of occlusion. The balloon at the balloon guide was inflated to profile of the vessel. Local aspiration was performed at the intermediate catheter upon withdrawal of the device under fluoroscopic observation, with aspiration at the balloon guide. Once the retrieve her was entirely removed from the system, free aspiration was confirmed at the hub of the intermediate catheter, with free blood return confirmed. Control angiogram was again performed. Persistent occlusion at the dominant MCA branches confirmed, with unchanged TICI 2a flow. The microcatheter and microwire were again used, with the same CAT 5 132 cm length intermediate catheter. Microwire and microcatheter were again advanced beyond the occluded segments, the micro wire was removed and a new stent tree for device was selected. At this time we deployed a 4 mm x 40 mm solitaire platinum device, stripping the microcatheter from the delivery wire. After the device was deployed across the occlusion, the intermediate catheter was placed at the site of occlusion. 3 minutes interval was observed. The balloon at the balloon guide was then inflated to profile of the vessel. At this time, the patient experienced asystole, treated by deflating the balloon for 2 minutes and with pharmacologic treatment from  the anesthesia team. After recovery and treatment, the balloon was inflated again without incident. Local aspiration was performed at the intermediate catheter upon withdrawal of the device under fluoroscopic observation, with aspiration at the balloon guide. Once the retrieve her was entirely removed from the system, free aspiration was confirmed at the hub of the  intermediate catheter, with free blood return confirmed Balloon guide was removed. Angio-Seal was deployed at the right common femoral artery. Flat panel CT was performed. Estimated blood loss approximately 50 cc. IMPRESSION: Status post ultrasound guided access right common femoral artery for cerebral angiogram and mechanical thrombectomy of acute emergent large vessel occlusion of the right MCA, achieving full TICI 3 reperfusion of the right MCA target vessel after 3 passes. Intracranial hemorrhage remote from the target vessel was identified on flat panel CT in the patient remained intubated. Signed, Yvone Neu. Reyne Dumas, RPVI [facility] Radiology PLAN: Patient remained intubated. Formal CT now Admit to neuro ICU NIR to follow Electronically Signed   By: Gilmer Mor D.O.   On: 06/08/2018 06:11   Ir US Guide Vasc Access Right  Result Date: 06/08/2018 INDICATION: 84 year old male with a history of acute right MCA stroke, left-sided symptoms. Functional baseline with NIH stroke scale of 23 EXAM: ULTRASOUND GUIDED ACCESS RIGHT COMMON FEMORAL ARTERY CEREBRAL ANGIOGRAM MECHANICAL THROMBECTOMY FOR EMERGENT LARGE VESSEL OCCLUSION RIGHT MCA DEPLOYMENT ANGIO-SEAL FOR HEMOSTASIS LIMITED HEAD CT COMPARISON:  CT imaging same day 2018/06/22 MEDICATIONS: 2 g Ancef. The antibiotic was administered within 1 hour of the procedure ANESTHESIA/SEDATION: General endotracheal tube anesthesia The patient was also continuously monitored during the procedure by the interventional radiology nurse under my direct supervision.  CONTRAST:  100 cc Omni FLUOROSCOPY TIME:  Fluoroscopy Time: 35 minutes 30 seconds (1761 mGy). COMPLICATIONS: Intracranial hemorrhage identified on flat panel CT TECHNIQUE: Informed written consent was obtained from the patient's family after a thorough discussion of the procedural risks, benefits and alternatives. Specific risks discussed include: Bleeding, infection, contrast reaction, kidney injury/failure, need for further procedure/surgery, arterial injury or dissection, embolization to new territory, intracranial hemorrhage (10-15% risk), neurologic deterioration, cardiopulmonary collapse, death. All questions were addressed. Maximal Sterile Barrier Technique was utilized including during the procedure including caps, mask, sterile gowns, sterile gloves, sterile drape, hand hygiene and skin antiseptic. A timeout was performed prior to the initiation of the procedure. The anesthesia team was present to provide general endotracheal tube anesthesia and for patient monitoring during the procedure. Interventional neuro radiology nursing staff was also present. FINDINGS: Initial: Right common carotid artery:  Normal course caliber and contour. Right external carotid artery: Patent with antegrade flow. Right internal carotid artery: Normal course caliber and contour of the cervical portion. Vertical and petrous segment patent with normal course caliber contour. Cavernous segment patent. Clinoid segment patent. Antegrade flow of the ophthalmic artery. Ophthalmic segment patent. Terminus patent. Fetal right PCA. Right MCA: Acute cut off of the proximal M1 beyond the terminus, with no flow through the segment and no perfusion of the expected territory. Right ACA: A 1 segment patent. A 2 segment perfuses the right territory. Final: After 3 passes with mechanical thrombectomy, there is full restoration of flow through the target MCA segment, TICI 3 flow. Flat panel CT: Flat panel CT demonstrates acute intracranial  hemorrhage located away from the site of mechanical thrombectomy/embolectomy. No hemorrhage in the left sylvian region. Hemorrhage located in the pericallosal subarachnoid spaces and within the left para median frontal/parietal parenchyma. Additional site of hemorrhage within the left temporal lobe. Additional site of hemorrhage overlying the right frontal sulci within subarachnoid spaces. PROCEDURE: Patient is brought emergently to the neuro angiography suite, with the patient identified appropriately and placed supine position on the table. Left radial arterial line was placed by the anesthesia team. The patient is then prepped and draped in the  usual sterile fashion. Ultrasound survey of the right inguinal region was performed with images stored and sent to PACs. 11 blade scalpel was used to make a small incision. Blunt dissection was performed. A micropuncture needle was used access the right common femoral artery under ultrasound. With excellent arterial blood flow returned, an .018 micro wire was passed through the needle, observed to enter the abdominal aorta under fluoroscopy. The needle was removed, and a micropuncture sheath was placed over the wire. The inner dilator and wire were removed, and an 035 Bentson wire was advanced under fluoroscopy into the abdominal aorta. The sheath was removed and a standard 5 [place] vascular sheath was placed. The dilator was removed and the sheath was flushed. A 70F JB-1 diagnostic catheter was advanced over the wire to the proximal descending thoracic aorta. Wire was then removed. Double flush of the catheter was performed. Catheter was then used to select the right common carotid artery. Formal angiogram was performed, with roadmap achieved. Exchange length Rosen wire was then passed through the diagnostic catheter to the cervical ICA and the diagnostic catheter was removed. The 5 French sheath was removed and exchanged for 8 French 55 centimeter BrightTip sheath. Sheath  was flushed and attached to pressurized and heparinized saline bag for constant forward flow. Then a coaxial intermediate catheter and microcatheter combination was prepared on the back table. This combination was 115cm CAT-5 catheter and a Trevo Provue18 microcatheter, with a synchro soft wire. This combination was then advanced through the balloon guide into the ICA. System was advanced into the internal carotid artery, to the level of the occlusion. The micro wire was then carefully advanced through the occluded segment. Microcatheter was then pushed through the occluded segment and the wire was removed. Intermediate catheter was advanced over the micro wire to the M1 segment. Blood was then aspirated through the hub of the microcatheter, and a gentle contrast injection was performed confirming intraluminal position. A rotating hemostatic valve was then attached to the back end of the microcatheter, and a pressurized and heparinized saline bag was attached to the catheter. 5 x 33 Embotrap device was then selected. Back flush was achieved at the rotating hemostatic valve, and then the device was gently advanced through the microcatheter to the distal end. The retriever was then unsheathed by withdrawing the microcatheter under fluoroscopy. Once the retriever was completely unsheathed, the microcatheter was carefully stripped from the delivery wire of the device. Control angiogram was then performed from the balloon catheter. 3 minute time interval was observed. The balloon on the balloon guide was then inflated to profile of the vessel. Constant aspiration was then performed through the intermediate catheter, and constant gentle aspiration was performed at the balloon guide. This aspiration was continued as the retriever was gently and slowly withdrawn with fluoroscopic observation into the distal intermediate catheter, which given the length, was within the intracranial ICA and not the MCA. The entire system was  then gently withdrawn from the intracranial ICA and into the balloon guide. Once the retriever was entirely removed from the system, free aspiration was confirmed at the hub of the balloon guide, with free blood return confirmed. Control angiogram was then performed. Persistent occlusion at the proximal MCA was confirmed. The microcatheter and microwire were again used, with a new CAT 5 132 cm length intermediate catheter. Baseline angiogram demonstrated partial reperfusion at the site of the occlusion at this time,TICI 2a. Microwire and microcatheter were again advanced beyond the occlusion, the micro wire was  removed and stentreiver device was deployed, stripping the microcatheter from the wire. After the device was deployed across the occlusion, the intermediate catheter was placed at the site of occlusion. The balloon at the balloon guide was inflated to profile of the vessel. Local aspiration was performed at the intermediate catheter upon withdrawal of the device under fluoroscopic observation, with aspiration at the balloon guide. Once the retrieve her was entirely removed from the system, free aspiration was confirmed at the hub of the intermediate catheter, with free blood return confirmed. Control angiogram was again performed. Persistent occlusion at the dominant MCA branches confirmed, with unchanged TICI 2a flow. The microcatheter and microwire were again used, with the same CAT 5 132 cm length intermediate catheter. Microwire and microcatheter were again advanced beyond the occluded segments, the micro wire was removed and a new stent tree for device was selected. At this time we deployed a 4 mm x 40 mm solitaire platinum device, stripping the microcatheter from the delivery wire. After the device was deployed across the occlusion, the intermediate catheter was placed at the site of occlusion. 3 minutes interval was observed. The balloon at the balloon guide was then inflated to profile of the vessel.  At this time, the patient experienced asystole, treated by deflating the balloon for 2 minutes and with pharmacologic treatment from the anesthesia team. After recovery and treatment, the balloon was inflated again without incident. Local aspiration was performed at the intermediate catheter upon withdrawal of the device under fluoroscopic observation, with aspiration at the balloon guide. Once the retrieve her was entirely removed from the system, free aspiration was confirmed at the hub of the intermediate catheter, with free blood return confirmed Balloon guide was removed. Angio-Seal was deployed at the right common femoral artery. Flat panel CT was performed. Estimated blood loss approximately 50 cc. IMPRESSION: Status post ultrasound guided access right common femoral artery for cerebral angiogram and mechanical thrombectomy of acute emergent large vessel occlusion of the right MCA, achieving full TICI 3 reperfusion of the right MCA target vessel after 3 passes. Intracranial hemorrhage remote from the target vessel was identified on flat panel CT in the patient remained intubated. Signed, Yvone Neu. Reyne Dumas, RPVI Vascular and Interventional Radiology Specialists [facility] Radiology PLAN: Patient remained intubated. Formal CT now Admit to neuro ICU NIR to follow Electronically Signed   By: Gilmer Mor D.O.   On: 06/08/2018 06:11   Dg Chest Port 1 View  Result Date: 06/09/18 CLINICAL DATA:  84 y/o  M; assess ET tube position. EXAM: PORTABLE CHEST 1 VIEW COMPARISON:  None. FINDINGS: Normal cardiac silhouette. Aortic atherosclerosis with calcification. Endotracheal tube tip projects 4.7 cm above carina. No focal consolidation. No pleural effusion or pneumothorax. No acute osseous abnormality is evident. IMPRESSION: No active disease.  Endotracheal tube tip 4.8 cm above the carina. Electronically Signed   By: Mitzi Hansen M.D.   On: Jun 09, 2018 22:37   Ir Percutaneous Art  Thrombectomy/infusion Intracranial Inc Diag Angio  Result Date: 06/08/2018 INDICATION: 84 year old male with a history of acute right MCA stroke, left-sided symptoms. Functional baseline with NIH stroke scale of 23 EXAM: ULTRASOUND GUIDED ACCESS RIGHT COMMON FEMORAL ARTERY CEREBRAL ANGIOGRAM MECHANICAL THROMBECTOMY FOR EMERGENT LARGE VESSEL OCCLUSION RIGHT MCA DEPLOYMENT ANGIO-SEAL FOR HEMOSTASIS LIMITED HEAD CT COMPARISON:  CT imaging same day 09-Jun-2018 MEDICATIONS: 2 g Ancef. The antibiotic was administered within 1 hour of the procedure ANESTHESIA/SEDATION: General endotracheal tube anesthesia The patient was also continuously monitored during the procedure by the  interventional radiology nurse under my direct supervision. CONTRAST:  100 cc Omni FLUOROSCOPY TIME:  Fluoroscopy Time: 35 minutes 30 seconds (1761 mGy). COMPLICATIONS: Intracranial hemorrhage identified on flat panel CT TECHNIQUE: Informed written consent was obtained from the patient's family after a thorough discussion of the procedural risks, benefits and alternatives. Specific risks discussed include: Bleeding, infection, contrast reaction, kidney injury/failure, need for further procedure/surgery, arterial injury or dissection, embolization to new territory, intracranial hemorrhage (10-15% risk), neurologic deterioration, cardiopulmonary collapse, death. All questions were addressed. Maximal Sterile Barrier Technique was utilized including during the procedure including caps, mask, sterile gowns, sterile gloves, sterile drape, hand hygiene and skin antiseptic. A timeout was performed prior to the initiation of the procedure. The anesthesia team was present to provide general endotracheal tube anesthesia and for patient monitoring during the procedure. Interventional neuro radiology nursing staff was also present. FINDINGS: Initial: Right common carotid artery:  Normal course caliber and contour. Right external carotid artery: Patent with  antegrade flow. Right internal carotid artery: Normal course caliber and contour of the cervical portion. Vertical and petrous segment patent with normal course caliber contour. Cavernous segment patent. Clinoid segment patent. Antegrade flow of the ophthalmic artery. Ophthalmic segment patent. Terminus patent. Fetal right PCA. Right MCA: Acute cut off of the proximal M1 beyond the terminus, with no flow through the segment and no perfusion of the expected territory. Right ACA: A 1 segment patent. A 2 segment perfuses the right territory. Final: After 3 passes with mechanical thrombectomy, there is full restoration of flow through the target MCA segment, TICI 3 flow. Flat panel CT: Flat panel CT demonstrates acute intracranial hemorrhage located away from the site of mechanical thrombectomy/embolectomy. No hemorrhage in the left sylvian region. Hemorrhage located in the pericallosal subarachnoid spaces and within the left para median frontal/parietal parenchyma. Additional site of hemorrhage within the left temporal lobe. Additional site of hemorrhage overlying the right frontal sulci within subarachnoid spaces. PROCEDURE: Patient is brought emergently to the neuro angiography suite, with the patient identified appropriately and placed supine position on the table. Left radial arterial line was placed by the anesthesia team. The patient is then prepped and draped in the usual sterile fashion. Ultrasound survey of the right inguinal region was performed with images stored and sent to PACs. 11 blade scalpel was used to make a small incision. Blunt dissection was performed. A micropuncture needle was used access the right common femoral artery under ultrasound. With excellent arterial blood flow returned, an .018 micro wire was passed through the needle, observed to enter the abdominal aorta under fluoroscopy. The needle was removed, and a micropuncture sheath was placed over the wire. The inner dilator and wire were  removed, and an 035 Bentson wire was advanced under fluoroscopy into the abdominal aorta. The sheath was removed and a standard 5 [place] vascular sheath was placed. The dilator was removed and the sheath was flushed. A 51F JB-1 diagnostic catheter was advanced over the wire to the proximal descending thoracic aorta. Wire was then removed. Double flush of the catheter was performed. Catheter was then used to select the right common carotid artery. Formal angiogram was performed, with roadmap achieved. Exchange length Rosen wire was then passed through the diagnostic catheter to the cervical ICA and the diagnostic catheter was removed. The 5 French sheath was removed and exchanged for 8 French 55 centimeter BrightTip sheath. Sheath was flushed and attached to pressurized and heparinized saline bag for constant forward flow. Then a coaxial intermediate catheter and  microcatheter combination was prepared on the back table. This combination was 115cm CAT-5 catheter and a Trevo Provue18 microcatheter, with a synchro soft wire. This combination was then advanced through the balloon guide into the ICA. System was advanced into the internal carotid artery, to the level of the occlusion. The micro wire was then carefully advanced through the occluded segment. Microcatheter was then pushed through the occluded segment and the wire was removed. Intermediate catheter was advanced over the micro wire to the M1 segment. Blood was then aspirated through the hub of the microcatheter, and a gentle contrast injection was performed confirming intraluminal position. A rotating hemostatic valve was then attached to the back end of the microcatheter, and a pressurized and heparinized saline bag was attached to the catheter. 5 x 33 Embotrap device was then selected. Back flush was achieved at the rotating hemostatic valve, and then the device was gently advanced through the microcatheter to the distal end. The retriever was then unsheathed  by withdrawing the microcatheter under fluoroscopy. Once the retriever was completely unsheathed, the microcatheter was carefully stripped from the delivery wire of the device. Control angiogram was then performed from the balloon catheter. 3 minute time interval was observed. The balloon on the balloon guide was then inflated to profile of the vessel. Constant aspiration was then performed through the intermediate catheter, and constant gentle aspiration was performed at the balloon guide. This aspiration was continued as the retriever was gently and slowly withdrawn with fluoroscopic observation into the distal intermediate catheter, which given the length, was within the intracranial ICA and not the MCA. The entire system was then gently withdrawn from the intracranial ICA and into the balloon guide. Once the retriever was entirely removed from the system, free aspiration was confirmed at the hub of the balloon guide, with free blood return confirmed. Control angiogram was then performed. Persistent occlusion at the proximal MCA was confirmed. The microcatheter and microwire were again used, with a new CAT 5 132 cm length intermediate catheter. Baseline angiogram demonstrated partial reperfusion at the site of the occlusion at this time,TICI 2a. Microwire and microcatheter were again advanced beyond the occlusion, the micro wire was removed and stentreiver device was deployed, stripping the microcatheter from the wire. After the device was deployed across the occlusion, the intermediate catheter was placed at the site of occlusion. The balloon at the balloon guide was inflated to profile of the vessel. Local aspiration was performed at the intermediate catheter upon withdrawal of the device under fluoroscopic observation, with aspiration at the balloon guide. Once the retrieve her was entirely removed from the system, free aspiration was confirmed at the hub of the intermediate catheter, with free blood return  confirmed. Control angiogram was again performed. Persistent occlusion at the dominant MCA branches confirmed, with unchanged TICI 2a flow. The microcatheter and microwire were again used, with the same CAT 5 132 cm length intermediate catheter. Microwire and microcatheter were again advanced beyond the occluded segments, the micro wire was removed and a new stent tree for device was selected. At this time we deployed a 4 mm x 40 mm solitaire platinum device, stripping the microcatheter from the delivery wire. After the device was deployed across the occlusion, the intermediate catheter was placed at the site of occlusion. 3 minutes interval was observed. The balloon at the balloon guide was then inflated to profile of the vessel. At this time, the patient experienced asystole, treated by deflating the balloon for 2 minutes and with pharmacologic  treatment from the anesthesia team. After recovery and treatment, the balloon was inflated again without incident. Local aspiration was performed at the intermediate catheter upon withdrawal of the device under fluoroscopic observation, with aspiration at the balloon guide. Once the retrieve her was entirely removed from the system, free aspiration was confirmed at the hub of the intermediate catheter, with free blood return confirmed Balloon guide was removed. Angio-Seal was deployed at the right common femoral artery. Flat panel CT was performed. Estimated blood loss approximately 50 cc. IMPRESSION: Status post ultrasound guided access right common femoral artery for cerebral angiogram and mechanical thrombectomy of acute emergent large vessel occlusion of the right MCA, achieving full TICI 3 reperfusion of the right MCA target vessel after 3 passes. Intracranial hemorrhage remote from the target vessel was identified on flat panel CT in the patient remained intubated. Signed, Yvone Neu. Reyne Dumas, RPVI Vascular and Interventional Radiology Specialists [facility] Radiology  PLAN: Patient remained intubated. Formal CT now Admit to neuro ICU NIR to follow Electronically Signed   By: Gilmer Mor D.O.   On: 06/08/2018 06:11   Ct Head Code Stroke Wo Contrast  Result Date: 05/25/2018 CLINICAL DATA:  Code stroke.  Left-sided weakness and slurred speech EXAM: CT HEAD WITHOUT CONTRAST TECHNIQUE: Contiguous axial images were obtained from the base of the skull through the vertex without intravenous contrast. COMPARISON:  Head CT 05/28/2018 FINDINGS: Brain: There is no mass, hemorrhage or extra-axial collection. The size and configuration of the ventricles and extra-axial CSF spaces are normal. 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 No evidence of acute cortical infarct. There is hypoattenuation of the periventricular white matter, most commonly indicating chronic ischemic microangiopathy. Vascular: No abnormal hyperdensity of the major intracranial arteries or dural venous sinuses. No intracranial atherosclerosis. Skull: The visualized skull base, calvarium and extracranial soft tissues are normal. Sinuses/Orbits: No fluid levels or advanced mucosal thickening of the visualized paranasal sinuses. No mastoid or middle ear effusion. The orbits are normal. ASPECTS [facility] Stroke Program Early CT Score) - Ganglionic level infarction (caudate, lentiform nuclei, internal capsule, insula, M1-M3 cortex): 7 - Supraganglionic infarction (M4-M6 cortex): 3 Total score (0-10 with 10 being normal): 10 IMPRESSION: 1. No acute hemorrhage or mass lesion. 2. ASPECTS is 10. 3. 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 These results were called by telephone at the time of interpretation on 06/11/2018 at 4:09 pm to Dr. Caryl Pina , who verbally acknowledged these results. Electronically Signed   By: Deatra Robinson M.D.   On: 05/18/2018 16:24   MRI and MRA brain pending   TTE pending  LE venous doppler pending   PHYSICAL EXAM  Temp:  [94 F (34.4 C)-99.2 F (37.3  C)] 99.2 F (37.3 C) (09/24 0400) Pulse Rate:  [25-93] 75 (09/24 0744) Resp:  [12-19] 16 (09/24 0744) BP: (104-151)/(56-102) 109/102 (09/24 0744) SpO2:  [97 %-100 %] 99 % (09/24 0700) Arterial Line BP: (86-167)/(48-101) 89/79 (09/24 0700) FiO2 (%):  [30 %-40 %] 30 % (09/24 0744) Weight:  [78.1 kg] 78.1 kg (09/23 1617)  General - Well nourished, well developed, intubated not on sedation.  Ophthalmologic - fundi not visualized due to noncooperation.  Cardiovascular - Regular rate and rhythm.  Skin - b/l LEs foreleg b/l ulcers and wounds, with red/purple discoloration.   Neuro - intubated not on sedation, on pressor. Eyes closed, barely open on pain stimulation. PERRL, eye mid position, sluggish doll's eyes but able to move to the right slowly, not to the  left, no tracking, no blinking to visual threat bilaterally. Corneal weak on the left but positive on the right. Positive gag and cough. Facial symmetry not able to test due to ET tube. On pain stimulation, RUE 2/5 withdraw, RLE 3-/5 withdraw, LLE 2/5 and LUE 1/5 withdraw. DTR diminished and no babinski. Sensation, coordination and gait not tested.   ASSESSMENT/PLAN Mr. Justin [name] is a 84 y.o. male with history of DVT on Xarelto but stopped recently after LE cellulitis, BPH on foley placed recently in [facility] admitted for aphasia, left facial droop, left weakness and neglect. tPA given.    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 s/p tPA and IR with TICI3 reperfusion, embolic pattern, source unclear, concerning for LE DVT or cardioembolic source  Resultant intubated and left sided weakness and right gaze preference  MRI  pending  MRA  pending  CT head no acute finding  CTA head and neck - right M1 occlusion  2D Echo  pending  LDL 61  HgbA1c 6.2  none for VTE prophylaxis due to b/l LE ulcers and ICH  No antithrombotic prior to admission, now on No antithrombotic   Ongoing aggressive stroke risk factor  management  Therapy recommendations:  Pending   Disposition:  Pending   ICH with IVH likely due to tPA s/p reversal  CT post procedure showed b/l frontal ICH, left BG and temporal ICH with IVH and SAH  Repeat CT stable with left frontal/BG ICH decompressed to lateral ventricle  MRI pending  If hydrocephalus, will have NSG consult  tPA reversed with TXA and cryo  Fibrinogen 370->pending  LE venous stasis with ulcers and cellulitis  Wound care on board  LE venous doppler pending  On rocephin   Will do blood culture  Leukocytosis   Blood culture pending  UA pending  Urine culture pending  WBC 14->12.1->12.7  Hypertension/hypotension . Stable on the low side  Long term BP goal < 140  Off cleviprex  Now on neo  Other Stroke Risk Factors  Advanced age  Other Active Problems  BPH - on foley catheter  Dysphagia - consider OG tube if needed  Hospital day # 1  This patient is critically ill due to right MCA infarct, ICH, SAH, IVH due to tPA, LE ulcers, hypotension and at significant risk of neurological worsening, death form cerebral edema, brain herniation, recurrent stroke, hematoma expansion, DVT, paradoxical emboli. This patient's care requires constant monitoring of vital signs, hemodynamics, respiratory and cardiac monitoring, review of multiple databases, neurological assessment, discussion with family, other specialists and medical decision making of high complexity. I spent 50 minutes of neurocritical care time in the care of this patient. I had long discussion with wife over the phone, updated pt current condition, treatment plan and potential prognosis. She expressed understanding and appreciation.    Marvel Plan, MD PhD Stroke Neurology 06/08/2018 10:02 AM    To contact Stroke Continuity provider, please refer to [email or web address]. After hours, contact General Neurology

## 2018-06-08 NOTE — Progress Notes (Signed)
OT Cancellation Note  Patient Details Name: Forest BeckerJames A Arocho MRN: [id number] DOB: May 07, 1928   Cancelled Treatment:    Reason Eval/Treat Not Completed: Other (comment). Noted OT order discontinued.Please reorder when appropriate for OT. Thanks  Burnett CorrenteWARD,HILLARY  Theron Cumbie, OT/L   Acute OT Clinical Specialist Acute Rehabilitation Services Pager [phone] Office [phone]  06/08/2018, 8:24 AM

## 2018-06-08 NOTE — Progress Notes (Signed)
On call cross coverage note  Repeat CTH done past midnight. Left frontal hematoma decompressed in to the ventricular system with increased blood in ventricles. Stable size of ventricles and stable bleeds. Stable SAH over the convexities and cerebellum.  Plan: Monitor clinically with strict BP goals 120-140. Post tPA and post EVT care as ordered. Will update family. No need for NSGY consultation at this time.  -- Milon DikesAshish Auburn Hester, MD [facility] Pager: [phone] If 7pm to 7am, please call on call as listed on AMION.

## 2018-06-08 NOTE — Anesthesia Postprocedure Evaluation (Signed)
Anesthesia Post Note  Patient: Justin BeckerJames A [name]  Procedure(s) Performed: IR WITH ANESTHESIA (N/A )     Patient location during evaluation: SICU Anesthesia Type: General Level of consciousness: sedated and patient remains intubated per anesthesia plan Pain management: pain level controlled Vital Signs Assessment: post-procedure vital signs reviewed and stable Respiratory status: patient remains intubated per anesthesia plan Cardiovascular status: stable Anesthetic complications: no    Last Vitals:  Vitals:   06/08/18 0050 06/08/18 0058  BP:    Pulse: (!) 25 73  Resp: 18 15  Temp: 36.9 C   SpO2: 100% 100%    Last Pain:  Vitals:   06/08/18 0050  TempSrc: Axillary                 Justin LoronJohn Jessee [name]

## 2018-06-08 NOTE — Progress Notes (Signed)
Patient transferred to MRI and returned to 4N30. Patient tolerated well. No complications. Vital signs stable throughout. RT will continue to monitor.

## 2018-06-08 NOTE — Plan of Care (Signed)
Pt with chronic foley catheter in place, urine clear yellow, without odor.

## 2018-06-08 NOTE — Progress Notes (Signed)
Shortly after arrival to 4N30 pt BP above goal of SBP 120-140, also being informed of post IR CT results showing pt to have large amount of hemorrhage with new orders for TXA and Cryo. Remained in the room for extensive time getting medications initiated and titrated to obtain SBP goal along with TXA. Left A-Line initially with appropriate wave form. After return from 3 hr follow up CT around 0005 switched to using cuff pressures for SBP goal management r/t A-Line now very positional. [facility]

## 2018-06-08 NOTE — Progress Notes (Signed)
NAME:  Justin BeckerJames A [name], MRN:  [id number], DOB:  [birth year], LOS: 1 ADMISSION DATE:  03-05-2018, CONSULTATION DATE:  10/03/17 REFERRING MD:  Otelia LimesLINDZEN MD CHIEF COMPLAINT: Left sided flaccidity and aphasia  Brief History   84 yo male former smoker with aphasia, Rt gaze, Lt side weakness.  CT angio head/neck showed Rt MCA M1 occlusion.  Tx with tPA and then neuro IR did mechanical thrombectomy.  Developed acute SAH afterward.  Intubated for airway protection.   Significant Hospital Events   9/23 Admit, ETT, A-line, Neuro IR, reversal TPA  Consults: date of consult/date signed off & final recs:  9/23 Neuro IR   Procedures (surgical and bedside):  9/23 ETT >>  9/23 Neuro IR- thrombectomy/ revascularization  Significant Diagnostic Tests:  CT head 9/23 >> old Lt basal ganglia infarct, chronic ischemic microangiopathy CT angio head/neck 9/23 >> Rt MCA M1 occlusion, apical lung fibrosis CT head 9/24 >> decompression of Lt frontal lobe hemorrhage into ventricular system, stable SAH; frontal/parietal, occipital, lt temporal lobe hemorrhage  Micro Data:  Antimicrobials:  Rocephin 9/23 >>   Subjective:  Remains on vent.  Objective   Blood pressure 127/67, pulse 78, temperature 99.2 F (37.3 C), temperature source Axillary, resp. rate 17, height 5\' 10"  (1.778 m), weight 78.1 kg, SpO2 99 %.    Vent Mode: PRVC FiO2 (%):  [30 %-40 %] 30 % Set Rate:  [14 bmp] 14 bmp Vt Set:  [580 mL] 580 mL PEEP:  [5 cmH20] 5 cmH20 Plateau Pressure:  [12 cmH20-16 cmH20] 12 cmH20   Intake/Output Summary (Last 24 hours) at 06/08/2018 0743 Last data filed at 06/08/2018 0700 Gross per 24 hour  Intake 3171.76 ml  Output 2000 ml  Net 1171.76 ml   Filed Weights   001/19/19 1617  Weight: 78.1 kg    Examination:  General - unresponsive Eyes - pupils dilated and weakly reactive Lt more than Rt ENT - ETT in place Cardiac - regular rate/rhythm, no murmur Chest - scattered rhonchi Abdomen - soft, non  tender, + bowel sounds GU - foley in place Extremities - 2+ edema Skin - multiple areas of ecchymosis, ulceration on lower legs b/l, stage 2 sacral wound Neuro - doesn't follow commands, grimaces with stimulation   Assessment & Plan:   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 ICH with IVH. - neuro to assess prognosis  Compromised airway with acute hypoxic respiratory failure. Fibrotic changes on chest imaging. - pressure support wean - mental status barrier to extubation trial - f/u CXR  Hypotension. - goal SBP 120 to 140 - continue pressors  Sacral wound, and lower leg ulcers. - present prior to admission - continue rocephin - would care consult  Anemia of critical illness. - f/u CBC  Chronic foley. - monitor urine outpt    Disposition / Summary of Today's Plan 06/08/18   Admit to ICU, reverse TPA, strict BP control, frequent neuro monitoring.    Diet: NPO Pain/Anxiety/Delirium protocol: prn versed, fentanyl DVT prophylaxis:SCD if able with leg wounds GI prophylaxis: pepcid Mobility: bed rest  Code Status: full  Family Communication: no family at bedside  Labs   CBC: Recent Labs  Lab 001/19/19 1600 001/19/19 1606 06/08/18 0435  WBC 12.1*  --  12.7*  NEUTROABS 5.8  --   --   HGB 11.7* 12.2* 9.6*  HCT 35.9* 36.0* 29.3*  MCV 96.0  --  95.4  PLT 495*  --  407*   Basic Metabolic Panel: Recent Labs  Lab  06/02/18 0530 05/25/2018 1600 05/21/2018 1606 06/08/2018 2052 06/08/18 0435  NA 138 136 136  --  140  K 3.3* 4.1 4.1  --  3.8  CL 106 100 103  --  109  CO2 23 24  --   --  22  GLUCOSE 96 101* 94  --  109*  BUN 16 24* 27*  --  17  CREATININE 0.67 0.95 0.90  --  0.79  CALCIUM 7.9* 8.5*  --   --  7.9*  MG  --   --   --  1.8  --   PHOS  --   --   --  3.9  --    GFR: Estimated Creatinine Clearance: 65.9 mL/min (by C-G formula based on SCr of 0.79 mg/dL). Recent Labs  Lab 05/22/2018 1600 06/08/18 0435  WBC 12.1* 12.7*   Liver Function  Tests: Recent Labs  Lab 05/31/2018 1600  AST 24  ALT 19  ALKPHOS 70  BILITOT 0.9  PROT 5.9*  ALBUMIN 2.9*   No results for input(s): LIPASE, AMYLASE in the last 168 hours. No results for input(s): AMMONIA in the last 168 hours. ABG    Component Value Date/Time   PHART 7.421 06/08/2018 0452   PCO2ART 32.2 06/08/2018 0452   PO2ART 118.0 (H) 06/08/2018 0452   HCO3 20.9 06/08/2018 0452   TCO2 22 06/08/2018 0452   ACIDBASEDEF 3.0 (H) 06/08/2018 0452   O2SAT 99.0 06/08/2018 0452    Coagulation Profile: Recent Labs  Lab 06/08/2018 1600  INR 1.26   Cardiac Enzymes: No results for input(s): CKTOTAL, CKMB, CKMBINDEX, TROPONINI in the last 168 hours. HbA1C: Hgb A1c MFr Bld  Date/Time Value Ref Range Status  06/08/2018 04:35 AM 6.2 (H) 4.8 - 5.6 % Final    Comment:    (NOTE) Pre diabetes:          5.7%-6.4% Diabetes:              >6.4% Glycemic control for   <7.0% adults with diabetes    CBG: No results for input(s): GLUCAP in the last 168 hours.  CC time 33 minutes  Coralyn Helling, MD [facility] Pulmonary/Critical Care 06/08/2018, 8:03 AM

## 2018-06-08 NOTE — Progress Notes (Signed)
Patient transported from 4N30 to CT and back without any complications. 

## 2018-06-09 ENCOUNTER — Inpatient Hospital Stay (HOSPITAL_COMMUNITY): Payer: Medicare Other

## 2018-06-09 DIAGNOSIS — L03119 Cellulitis of unspecified part of limb: Secondary | ICD-10-CM

## 2018-06-09 DIAGNOSIS — I340 Nonrheumatic mitral (valve) insufficiency: Secondary | ICD-10-CM

## 2018-06-09 LAB — BASIC METABOLIC PANEL
Anion gap: 14 (ref 5–15)
BUN: 15 mg/dL (ref 8–23)
CHLORIDE: 107 mmol/L (ref 98–111)
CO2: 18 mmol/L — ABNORMAL LOW (ref 22–32)
Calcium: 7.9 mg/dL — ABNORMAL LOW (ref 8.9–10.3)
Creatinine, Ser: 0.76 mg/dL (ref 0.61–1.24)
Glucose, Bld: 91 mg/dL (ref 70–99)
POTASSIUM: 3.7 mmol/L (ref 3.5–5.1)
SODIUM: 139 mmol/L (ref 135–145)

## 2018-06-09 LAB — GLUCOSE, CAPILLARY: Glucose-Capillary: 131 mg/dL — ABNORMAL HIGH (ref 70–99)

## 2018-06-09 LAB — URINE CULTURE: Culture: NO GROWTH

## 2018-06-09 LAB — CBC
HEMATOCRIT: 30 % — AB (ref 39.0–52.0)
Hemoglobin: 9.8 g/dL — ABNORMAL LOW (ref 13.0–17.0)
MCH: 31.6 pg (ref 26.0–34.0)
MCHC: 32.7 g/dL (ref 30.0–36.0)
MCV: 96.8 fL (ref 78.0–100.0)
PLATELETS: 387 10*3/uL (ref 150–400)
RBC: 3.1 MIL/uL — AB (ref 4.22–5.81)
RDW: 14.1 % (ref 11.5–15.5)
WBC: 11.3 10*3/uL — AB (ref 4.0–10.5)

## 2018-06-09 LAB — ECHOCARDIOGRAM COMPLETE
Height: 70 in
Weight: 2754.87 oz

## 2018-06-09 LAB — MAGNESIUM
MAGNESIUM: 1.6 mg/dL — AB (ref 1.7–2.4)
MAGNESIUM: 6.7 mg/dL — AB (ref 1.7–2.4)

## 2018-06-09 LAB — PHOSPHORUS
PHOSPHORUS: 1.7 mg/dL — AB (ref 2.5–4.6)
Phosphorus: 2 mg/dL — ABNORMAL LOW (ref 2.5–4.6)

## 2018-06-09 MED ORDER — VITAL AF 1.2 CAL PO LIQD
1000.0000 mL | ORAL | Status: DC
  Administered 2018-06-09: 1000 mL

## 2018-06-09 MED ORDER — VITAL HIGH PROTEIN PO LIQD: 1000.0000 mL | ORAL | Status: DC

## 2018-06-09 MED ORDER — OCUVITE-LUTEIN PO CAPS
1.0000 | ORAL_CAPSULE | Freq: Every day | ORAL | Status: DC
  Filled 2018-06-09 (×2): qty 1

## 2018-06-09 MED ORDER — POTASSIUM PHOSPHATES 15 MMOLE/5ML IV SOLN
10.0000 mmol | Freq: Once | INTRAVENOUS | Status: AC
  Administered 2018-06-09: 10 mmol via INTRAVENOUS
  Filled 2018-06-09: qty 3.33

## 2018-06-09 MED ORDER — PRO-STAT SUGAR FREE PO LIQD
30.0000 mL | Freq: Two times a day (BID) | ORAL | Status: DC
  Filled 2018-06-09: qty 30

## 2018-06-09 MED ORDER — MAGNESIUM SULFATE IN D5W 1-5 GM/100ML-% IV SOLN
1.0000 g | Freq: Once | INTRAVENOUS | Status: AC
  Administered 2018-06-09: 1 g via INTRAVENOUS
  Filled 2018-06-09: qty 100

## 2018-06-09 MED ORDER — FREE WATER
30.0000 mL | Status: DC
  Administered 2018-06-09 – 2018-06-10 (×5): 30 mL

## 2018-06-09 MED ORDER — PROSIGHT PO TABS
1.0000 | ORAL_TABLET | Freq: Every day | ORAL | Status: DC
  Administered 2018-06-09 – 2018-06-10 (×2): 1 via ORAL
  Filled 2018-06-09 (×2): qty 1

## 2018-06-09 MED ORDER — VITAL 1.5 CAL PO LIQD
1000.0000 mL | ORAL | Status: DC
  Filled 2018-06-09: qty 1000

## 2018-06-09 MED ORDER — LABETALOL HCL 5 MG/ML IV SOLN: 10.0000 mg | INTRAVENOUS | Status: DC | PRN

## 2018-06-09 MED ORDER — SODIUM CHLORIDE 0.9 % IV SOLN
INTRAVENOUS | Status: DC | PRN
  Administered 2018-06-09: 16:00:00 via INTRAVENOUS

## 2018-06-09 MED ORDER — VITAL AF 1.2 CAL PO LIQD
1000.0000 mL | ORAL | Status: DC
  Administered 2018-06-09 – 2018-06-15 (×6): 1000 mL
  Filled 2018-06-09: qty 1000

## 2018-06-09 NOTE — Progress Notes (Signed)
Referring Physician(s): CODE STROKE  Supervising Physician: Gilmer Mor  Patient Status:  [facility] - In-pt  Chief Complaint: None  Subjective:  Right MCA M1 segment occlusion s/p revascularization using mechanical thrombectomy July 07, 2018 with Dr. Loreta Ave. Patient laying in bed intubated. All extremities withdraw from pain. Right groin incision c/d/i.   Allergies: Patient has no known allergies.  Medications: Prior to Admission medications   Medication Sig Start Date End Date Taking? Authorizing Provider  cephALEXin (KEFLEX) 500 MG capsule Take 1 capsule (500 mg total) by mouth 3 (three) times daily for 7 days. 06/02/18 06/09/18 Yes Amin, Loura Halt, MD  tamsulosin (FLOMAX) 0.4 MG CAPS capsule Take 1 capsule (0.4 mg total) by mouth daily. 06/03/18 07/03/18 Yes Amin, Loura Halt, MD     Vital Signs: BP 125/60   Pulse 71   Temp 99.7 F (37.6 C) (Axillary) Comment: tylenol given   Resp 10   Ht 5\' 10"  (1.778 m)   Wt 172 lb 2.9 oz (78.1 kg)   SpO2 99%   BMI 24.71 kg/m   Physical Exam  Constitutional: He appears well-developed and well-nourished. No distress.  Intubated.  Pulmonary/Chest: Effort normal. No respiratory distress.  Intubated.  Neurological:  Intubated without sedation. PERRL bilaterally and opens eyes to pain stimulus. EOMs not assessed. Visual fields not assessed. Unable to assess facial asymmetry due to ET tube. Unable to protrude tongue. All extremities withdraw from pain. Pronator drift not assessed. Fine motor and coordination not assessed. Gait not assessed. Romberg not assessed. Heel to toe not assessed. Distal pulses 2+ bilaterally.  Skin: Skin is warm and dry.  Right groin incision stable without active bleeding or hematoma.  Psychiatric:  Intubated.    Imaging: Ct Angio Head W Or Wo Contrast  Result Date: 07-07-2018 CLINICAL DATA:  Left-sided weakness and slurred speech. EXAM: CT ANGIOGRAPHY HEAD AND NECK TECHNIQUE: Multidetector CT  imaging of the head and neck was performed using the standard protocol during bolus administration of intravenous contrast. Multiplanar CT image reconstructions and MIPs were obtained to evaluate the vascular anatomy. Carotid stenosis measurements (when applicable) are obtained utilizing NASCET criteria, using the distal internal carotid diameter as the denominator. CONTRAST:  50mL ISOVUE-370 IOPAMIDOL (ISOVUE-370) INJECTION 76% COMPARISON:  Head CT same day FINDINGS: CTA NECK FINDINGS AORTIC ARCH: There is mild calcific atherosclerosis of the aortic arch. There is no aneurysm, dissection or hemodynamically significant stenosis of the visualized ascending aorta and aortic arch. Conventional 3 vessel aortic branching pattern. The visualized proximal subclavian arteries are widely patent. RIGHT CAROTID SYSTEM: --Common carotid artery: Widely patent origin without common carotid artery dissection or aneurysm. --Internal carotid artery: No dissection, occlusion or aneurysm. No hemodynamically significant stenosis. --External carotid artery: No acute abnormality. LEFT CAROTID SYSTEM: --Common carotid artery: Widely patent origin without common carotid artery dissection or aneurysm. --Internal carotid artery:No dissection, occlusion or aneurysm. No hemodynamically significant stenosis. --External carotid artery: No acute abnormality. VERTEBRAL ARTERIES: Left dominant configuration. Both origins are normal. No dissection, occlusion or flow-limiting stenosis to the vertebrobasilar confluence. SKELETON: There is no bony spinal canal stenosis. No lytic or blastic lesion. OTHER NECK: Normal pharynx, larynx and major salivary glands. No cervical lymphadenopathy. Unremarkable thyroid gland. UPPER CHEST: Biapical opacities most suggestive of fibrosis or scarring. A superimposed mass or consolidation with difficult to exclude. CTA HEAD FINDINGS ANTERIOR CIRCULATION: --Intracranial internal carotid arteries: Normal. --Anterior  cerebral arteries: Normal. Both A1 segments are present. Patent anterior communicating artery. --Middle cerebral arteries: Complete occlusion of the right middle cerebral  artery M1 segment. No significant collateral flow within the right MCA territory. The left MCA is normal. --Posterior communicating arteries: Present on the right, absent on the left. POSTERIOR CIRCULATION: --Basilar artery: Normal. --Posterior cerebral arteries: Normal. --Superior cerebellar arteries: Normal. --Inferior cerebellar arteries: Normal anterior and posterior inferior cerebellar arteries. VENOUS SINUSES: As permitted by contrast timing, patent. ANATOMIC VARIANTS: None DELAYED PHASE: Not performed. Review of the MIP images confirms the above findings. IMPRESSION: 1. Emergent large vessel occlusion of the right middle cerebral artery M1 segment with minimal collateral flow demonstrated in the right MCA territory. 2. Biapical pulmonary opacities most consistent with fibrosis or scarring. A superimposed mass or consolidation would be difficult to exclude. If prior chest CTs exists, comparison would be helpful. 3. No arterial occlusion or hemodynamically significant stenosis of the major arteries of the neck. Critical Value/emergent results were called by telephone at the time of interpretation on July 05, 2018 at 4:27 pm to Dr. Caryl Pina, who verbally acknowledged these results. Electronically Signed   By: Deatra Robinson M.D.   On: July 05, 2018 16:43   Ct Head Wo Contrast  Result Date: 06/08/2018 CLINICAL DATA:  84 y/o  M; follow-up of intracranial hemorrhage. EXAM: CT HEAD WITHOUT CONTRAST TECHNIQUE: Contiguous axial images were obtained from the base of the skull through the vertex without intravenous contrast. COMPARISON:  July 05, 2018 CT head. FINDINGS: Brain: Multiple brain parenchymal hemorrhages within the paramedian frontal, parietal, and occipital lobes as well as the left temporal lobes with hematocrit levels. Large area of  hemorrhage within the left paramedian posterior frontal cortex has decompressed into the ventricular system in the interim. Brain parenchymal hemorrhage is otherwise stable. Stable moderate volume of diffuse subarachnoid hemorrhage over cerebral convexities and cerebellum. Mild interval increase in intraventricular hemorrhage likely redistributed from the left frontal parenchymal hematoma as above within new blood fluid level in the right lateral ventricle atria. Ventricle size is stable. No new acute intracranial hemorrhage, stroke, or focal mass effect. No herniation. Stable background of chronic microvascular ischemic changes and volume loss of the brain. Vascular: Calcific atherosclerosis of the carotid siphons. Skull: Normal. Negative for fracture or focal lesion. Sinuses/Orbits: No acute finding. Other: None. IMPRESSION: 1. Interval decompression of left paramedian frontal lobe brain parenchymal hemorrhage into the ventricular system with increased blood products in both lateral ventricular atria. Stable ventricle size. 2. Otherwise stable brain parenchymal hemorrhage in paramedian frontal, parietal, and occipital lobes as well as the left temporal lobe. 3. Stable moderate volume of subarachnoid hemorrhage over convexities and cerebellum. 4. No new acute intracranial abnormality identified. Electronically Signed   By: Mitzi Hansen M.D.   On: 06/08/2018 00:54   Ct Head Wo Contrast  Result Date: 2018-07-05 CLINICAL DATA:  Stroke. Status post endovascular revascularization M1 occlusion and tPA. EXAM: CT HEAD WITHOUT CONTRAST TECHNIQUE: Contiguous axial images were obtained from the base of the skull through the vertex without intravenous contrast. COMPARISON:  CT HEAD July 05, 2018 at 1607 hours and procedural CT HEAD at Jul 05, 2018 1828 hours. FINDINGS: BRAIN: Multiple intraparenchymal hematomas including bilateral frontal parietal lobes, LEFT temporal lobe and likely within vermis.  Regional mass effect of frontoparietal hematomas flattening the lateral ventricles. LEFT intraventricular blood products with hematocrit level. Suspected hematocrit levels within the hematomas, however there may be a component of contrast. Scattered extra-axial blood products predominately along the interhemispheric fissure and supra cerebellar cistern, cerebellar folia. Old LEFT occipital lobe infarct. No acute large vascular territory infarct. No hydrocephalus. Basal cisterns are patent. VASCULAR: Minimal  calcific atherosclerosis of the carotid siphons. SKULL: No skull fracture. No significant scalp soft tissue swelling. SINUSES/ORBITS: Trace paranasal sinus mucosal thickening. Mastoid air cells are well aerated.The included ocular globes and orbital contents are non-suspicious. OTHER: None. IMPRESSION: 1. Multiple new intraparenchymal hematomas with hematocrit levels consistent with acute and suspected active hemorrhage. 2. LEFT lateral intraventricular hemorrhage, possible parenchymal extension. 3. Scattered extra-axial blood products, confounded by postcontrast imaging. 4. Critical Value/emergent results were called by telephone at the time of interpretation on Jun 23, 2018 at 7:39 pm to Dr. Wilford Corner, Neurology, who verbally acknowledged these results. Electronically Signed   By: Awilda Metro M.D.   On: 07/04/2018 19:40   Ct Angio Neck W Or Wo Contrast  Result Date: 23-Jun-2018 CLINICAL DATA:  Left-sided weakness and slurred speech. EXAM: CT ANGIOGRAPHY HEAD AND NECK TECHNIQUE: Multidetector CT imaging of the head and neck was performed using the standard protocol during bolus administration of intravenous contrast. Multiplanar CT image reconstructions and MIPs were obtained to evaluate the vascular anatomy. Carotid stenosis measurements (when applicable) are obtained utilizing NASCET criteria, using the distal internal carotid diameter as the denominator. CONTRAST:  50mL ISOVUE-370 IOPAMIDOL (ISOVUE-370)  INJECTION 76% COMPARISON:  Head CT same day FINDINGS: CTA NECK FINDINGS AORTIC ARCH: There is mild calcific atherosclerosis of the aortic arch. There is no aneurysm, dissection or hemodynamically significant stenosis of the visualized ascending aorta and aortic arch. Conventional 3 vessel aortic branching pattern. The visualized proximal subclavian arteries are widely patent. RIGHT CAROTID SYSTEM: --Common carotid artery: Widely patent origin without common carotid artery dissection or aneurysm. --Internal carotid artery: No dissection, occlusion or aneurysm. No hemodynamically significant stenosis. --External carotid artery: No acute abnormality. LEFT CAROTID SYSTEM: --Common carotid artery: Widely patent origin without common carotid artery dissection or aneurysm. --Internal carotid artery:No dissection, occlusion or aneurysm. No hemodynamically significant stenosis. --External carotid artery: No acute abnormality. VERTEBRAL ARTERIES: Left dominant configuration. Both origins are normal. No dissection, occlusion or flow-limiting stenosis to the vertebrobasilar confluence. SKELETON: There is no bony spinal canal stenosis. No lytic or blastic lesion. OTHER NECK: Normal pharynx, larynx and major salivary glands. No cervical lymphadenopathy. Unremarkable thyroid gland. UPPER CHEST: Biapical opacities most suggestive of fibrosis or scarring. A superimposed mass or consolidation with difficult to exclude. CTA HEAD FINDINGS ANTERIOR CIRCULATION: --Intracranial internal carotid arteries: Normal. --Anterior cerebral arteries: Normal. Both A1 segments are present. Patent anterior communicating artery. --Middle cerebral arteries: Complete occlusion of the right middle cerebral artery M1 segment. No significant collateral flow within the right MCA territory. The left MCA is normal. --Posterior communicating arteries: Present on the right, absent on the left. POSTERIOR CIRCULATION: --Basilar artery: Normal. --Posterior  cerebral arteries: Normal. --Superior cerebellar arteries: Normal. --Inferior cerebellar arteries: Normal anterior and posterior inferior cerebellar arteries. VENOUS SINUSES: As permitted by contrast timing, patent. ANATOMIC VARIANTS: None DELAYED PHASE: Not performed. Review of the MIP images confirms the above findings. IMPRESSION: 1. Emergent large vessel occlusion of the right middle cerebral artery M1 segment with minimal collateral flow demonstrated in the right MCA territory. 2. Biapical pulmonary opacities most consistent with fibrosis or scarring. A superimposed mass or consolidation would be difficult to exclude. If prior chest CTs exists, comparison would be helpful. 3. No arterial occlusion or hemodynamically significant stenosis of the major arteries of the neck. Critical Value/emergent results were called by telephone at the time of interpretation on 07/15/2018 at 4:27 pm to Dr. Caryl Pina, who verbally acknowledged these results. Electronically Signed   By: Chrisandra Netters.D.  On: 05/26/2018 16:43   Mr Maxine Glenn Head Wo Contrast  Result Date: 06/08/2018 CLINICAL DATA:  Right MCA stroke with right M1 occlusion status post IV tPA and endovascular thrombectomy. Bilateral parenchymal and extra-axial hemorrhage on follow-up CTs. EXAM: MRI HEAD WITHOUT CONTRAST MRA HEAD WITHOUT CONTRAST TECHNIQUE: Multiplanar, multiecho pulse sequences of the brain and surrounding structures were obtained without intravenous contrast. Angiographic images of the head were obtained using MRA technique without contrast. COMPARISON:  Head CT 06/08/2018 and CTA 05/21/2018 FINDINGS: MRI HEAD FINDINGS Brain: There is an acute right MCA infarct with largely confluent cortical restricted diffusion throughout much of the frontal and parietal lobes as well as insula with posterior temporal and lateral occipital lobe involvement as well. The white matter of the right MCA territory is largely spared, however the right basal ganglia are  involved. Scattered areas of diffusion signal abnormality elsewhere in both cerebral hemispheres are predominantly related to blood products, however there may be a few small areas of nonhemorrhagic acute infarct superimposed. Punctate acute infarcts are present in the cerebellum bilaterally and left midbrain. Parenchymal hemorrhages are again seen in the cerebral and cerebellar hemispheres bilaterally with unchanged appearance of the largest hemorrhage is located in the paramedian frontal lobes and left temporal lobe. Numerous petechial hemorrhages are more conspicuous on MRI than on CT. Bilateral supratentorial and posterior fossa subarachnoid hemorrhage and intraventricular hemorrhage are similar to the prior CT. The ventricles are unchanged in size. There is moderate cerebral atrophy. There is a background of moderate chronic small vessel ischemic disease in the cerebral white matter. Vascular: Major intracranial vascular flow voids are preserved. Skull and upper cervical spine: Unremarkable bone marrow signal. Sinuses/Orbits: Unremarkable orbits. Paranasal sinuses and mastoid air cells are clear. Other: None. MRA HEAD FINDINGS The visualized distal vertebral arteries are patent to the basilar with the left being dominant. Patent PICA, AICA, and SCA origins are identified bilaterally. The basilar artery is widely patent. There are right larger than left posterior communicating arteries with a fetal origin of the right PCA. Both PCAs are patent without evidence of significant stenosis. The internal carotid arteries are widely patent from skull base to carotid termini. ACAs and MCAs are patent without evidence of proximal branch occlusion or significant stenosis. Specifically, the revascularized right M1 segment remains patent. No aneurysm is identified. IMPRESSION: 1. Acute large right MCA infarct primarily involving cortex and basal ganglia. 2. Bilateral parenchymal hemorrhages, subarachnoid hemorrhage, and  intraventricular hemorrhage, not significantly changed from today's earlier CT. 3. Moderate cerebral atrophy and chronic small vessel ischemic disease. 4. Negative head MRA. Continued patency of the revascularized right MCA. Electronically Signed   By: Sebastian Ache M.D.   On: 06/08/2018 12:54   Mr Brain Wo Contrast  Result Date: 06/08/2018 CLINICAL DATA:  Right MCA stroke with right M1 occlusion status post IV tPA and endovascular thrombectomy. Bilateral parenchymal and extra-axial hemorrhage on follow-up CTs. EXAM: MRI HEAD WITHOUT CONTRAST MRA HEAD WITHOUT CONTRAST TECHNIQUE: Multiplanar, multiecho pulse sequences of the brain and surrounding structures were obtained without intravenous contrast. Angiographic images of the head were obtained using MRA technique without contrast. COMPARISON:  Head CT 06/08/2018 and CTA 06/11/2018 FINDINGS: MRI HEAD FINDINGS Brain: There is an acute right MCA infarct with largely confluent cortical restricted diffusion throughout much of the frontal and parietal lobes as well as insula with posterior temporal and lateral occipital lobe involvement as well. The white matter of the right MCA territory is largely spared, however the right basal ganglia are involved.  Scattered areas of diffusion signal abnormality elsewhere in both cerebral hemispheres are predominantly related to blood products, however there may be a few small areas of nonhemorrhagic acute infarct superimposed. Punctate acute infarcts are present in the cerebellum bilaterally and left midbrain. Parenchymal hemorrhages are again seen in the cerebral and cerebellar hemispheres bilaterally with unchanged appearance of the largest hemorrhage is located in the paramedian frontal lobes and left temporal lobe. Numerous petechial hemorrhages are more conspicuous on MRI than on CT. Bilateral supratentorial and posterior fossa subarachnoid hemorrhage and intraventricular hemorrhage are similar to the prior CT. The  ventricles are unchanged in size. There is moderate cerebral atrophy. There is a background of moderate chronic small vessel ischemic disease in the cerebral white matter. Vascular: Major intracranial vascular flow voids are preserved. Skull and upper cervical spine: Unremarkable bone marrow signal. Sinuses/Orbits: Unremarkable orbits. Paranasal sinuses and mastoid air cells are clear. Other: None. MRA HEAD FINDINGS The visualized distal vertebral arteries are patent to the basilar with the left being dominant. Patent PICA, AICA, and SCA origins are identified bilaterally. The basilar artery is widely patent. There are right larger than left posterior communicating arteries with a fetal origin of the right PCA. Both PCAs are patent without evidence of significant stenosis. The internal carotid arteries are widely patent from skull base to carotid termini. ACAs and MCAs are patent without evidence of proximal branch occlusion or significant stenosis. Specifically, the revascularized right M1 segment remains patent. No aneurysm is identified. IMPRESSION: 1. Acute large right MCA infarct primarily involving cortex and basal ganglia. 2. Bilateral parenchymal hemorrhages, subarachnoid hemorrhage, and intraventricular hemorrhage, not significantly changed from today's earlier CT. 3. Moderate cerebral atrophy and chronic small vessel ischemic disease. 4. Negative head MRA. Continued patency of the revascularized right MCA. Electronically Signed   By: Sebastian Ache M.D.   On: 06/08/2018 12:54   Ir Ct Head Ltd  Result Date: 06/08/2018 INDICATION: 84 year old male with a history of acute right MCA stroke, left-sided symptoms. Functional baseline with NIH stroke scale of 23 EXAM: ULTRASOUND GUIDED ACCESS RIGHT COMMON FEMORAL ARTERY CEREBRAL ANGIOGRAM MECHANICAL THROMBECTOMY FOR EMERGENT LARGE VESSEL OCCLUSION RIGHT MCA DEPLOYMENT ANGIO-SEAL FOR HEMOSTASIS LIMITED HEAD CT COMPARISON:  CT imaging same day 06/10/2018  MEDICATIONS: 2 g Ancef. The antibiotic was administered within 1 hour of the procedure ANESTHESIA/SEDATION: General endotracheal tube anesthesia The patient was also continuously monitored during the procedure by the interventional radiology nurse under my direct supervision. CONTRAST:  100 cc Omni FLUOROSCOPY TIME:  Fluoroscopy Time: 35 minutes 30 seconds (1761 mGy). COMPLICATIONS: Intracranial hemorrhage identified on flat panel CT TECHNIQUE: Informed written consent was obtained from the patient's family after a thorough discussion of the procedural risks, benefits and alternatives. Specific risks discussed include: Bleeding, infection, contrast reaction, kidney injury/failure, need for further procedure/surgery, arterial injury or dissection, embolization to new territory, intracranial hemorrhage (10-15% risk), neurologic deterioration, cardiopulmonary collapse, death. All questions were addressed. Maximal Sterile Barrier Technique was utilized including during the procedure including caps, mask, sterile gowns, sterile gloves, sterile drape, hand hygiene and skin antiseptic. A timeout was performed prior to the initiation of the procedure. The anesthesia team was present to provide general endotracheal tube anesthesia and for patient monitoring during the procedure. Interventional neuro radiology nursing staff was also present. FINDINGS: Initial: Right common carotid artery:  Normal course caliber and contour. Right external carotid artery: Patent with antegrade flow. Right internal carotid artery: Normal course caliber and contour of the cervical portion. Vertical and petrous segment patent  with normal course caliber contour. Cavernous segment patent. Clinoid segment patent. Antegrade flow of the ophthalmic artery. Ophthalmic segment patent. Terminus patent. Fetal right PCA. Right MCA: Acute cut off of the proximal M1 beyond the terminus, with no flow through the segment and no perfusion of the expected  territory. Right ACA: A 1 segment patent. A 2 segment perfuses the right territory. Final: After 3 passes with mechanical thrombectomy, there is full restoration of flow through the target MCA segment, TICI 3 flow. Flat panel CT: Flat panel CT demonstrates acute intracranial hemorrhage located away from the site of mechanical thrombectomy/embolectomy. No hemorrhage in the left sylvian region. Hemorrhage located in the pericallosal subarachnoid spaces and within the left para median frontal/parietal parenchyma. Additional site of hemorrhage within the left temporal lobe. Additional site of hemorrhage overlying the right frontal sulci within subarachnoid spaces. PROCEDURE: Patient is brought emergently to the neuro angiography suite, with the patient identified appropriately and placed supine position on the table. Left radial arterial line was placed by the anesthesia team. The patient is then prepped and draped in the usual sterile fashion. Ultrasound survey of the right inguinal region was performed with images stored and sent to PACs. 11 blade scalpel was used to make a small incision. Blunt dissection was performed. A micropuncture needle was used access the right common femoral artery under ultrasound. With excellent arterial blood flow returned, an .018 micro wire was passed through the needle, observed to enter the abdominal aorta under fluoroscopy. The needle was removed, and a micropuncture sheath was placed over the wire. The inner dilator and wire were removed, and an 035 Bentson wire was advanced under fluoroscopy into the abdominal aorta. The sheath was removed and a standard 5 [place] vascular sheath was placed. The dilator was removed and the sheath was flushed. A 36F JB-1 diagnostic catheter was advanced over the wire to the proximal descending thoracic aorta. Wire was then removed. Double flush of the catheter was performed. Catheter was then used to select the right common carotid artery. Formal  angiogram was performed, with roadmap achieved. Exchange length Rosen wire was then passed through the diagnostic catheter to the cervical ICA and the diagnostic catheter was removed. The 5 French sheath was removed and exchanged for 8 French 55 centimeter BrightTip sheath. Sheath was flushed and attached to pressurized and heparinized saline bag for constant forward flow. Then a coaxial intermediate catheter and microcatheter combination was prepared on the back table. This combination was 115cm CAT-5 catheter and a Trevo Provue18 microcatheter, with a synchro soft wire. This combination was then advanced through the balloon guide into the ICA. System was advanced into the internal carotid artery, to the level of the occlusion. The micro wire was then carefully advanced through the occluded segment. Microcatheter was then pushed through the occluded segment and the wire was removed. Intermediate catheter was advanced over the micro wire to the M1 segment. Blood was then aspirated through the hub of the microcatheter, and a gentle contrast injection was performed confirming intraluminal position. A rotating hemostatic valve was then attached to the back end of the microcatheter, and a pressurized and heparinized saline bag was attached to the catheter. 5 x 33 Embotrap device was then selected. Back flush was achieved at the rotating hemostatic valve, and then the device was gently advanced through the microcatheter to the distal end. The retriever was then unsheathed by withdrawing the microcatheter under fluoroscopy. Once the retriever was completely unsheathed, the microcatheter was carefully stripped from  the delivery wire of the device. Control angiogram was then performed from the balloon catheter. 3 minute time interval was observed. The balloon on the balloon guide was then inflated to profile of the vessel. Constant aspiration was then performed through the intermediate catheter, and constant gentle  aspiration was performed at the balloon guide. This aspiration was continued as the retriever was gently and slowly withdrawn with fluoroscopic observation into the distal intermediate catheter, which given the length, was within the intracranial ICA and not the MCA. The entire system was then gently withdrawn from the intracranial ICA and into the balloon guide. Once the retriever was entirely removed from the system, free aspiration was confirmed at the hub of the balloon guide, with free blood return confirmed. Control angiogram was then performed. Persistent occlusion at the proximal MCA was confirmed. The microcatheter and microwire were again used, with a new CAT 5 132 cm length intermediate catheter. Baseline angiogram demonstrated partial reperfusion at the site of the occlusion at this time,TICI 2a. Microwire and microcatheter were again advanced beyond the occlusion, the micro wire was removed and stentreiver device was deployed, stripping the microcatheter from the wire. After the device was deployed across the occlusion, the intermediate catheter was placed at the site of occlusion. The balloon at the balloon guide was inflated to profile of the vessel. Local aspiration was performed at the intermediate catheter upon withdrawal of the device under fluoroscopic observation, with aspiration at the balloon guide. Once the retrieve her was entirely removed from the system, free aspiration was confirmed at the hub of the intermediate catheter, with free blood return confirmed. Control angiogram was again performed. Persistent occlusion at the dominant MCA branches confirmed, with unchanged TICI 2a flow. The microcatheter and microwire were again used, with the same CAT 5 132 cm length intermediate catheter. Microwire and microcatheter were again advanced beyond the occluded segments, the micro wire was removed and a new stent tree for device was selected. At this time we deployed a 4 mm x 40 mm solitaire  platinum device, stripping the microcatheter from the delivery wire. After the device was deployed across the occlusion, the intermediate catheter was placed at the site of occlusion. 3 minutes interval was observed. The balloon at the balloon guide was then inflated to profile of the vessel. At this time, the patient experienced asystole, treated by deflating the balloon for 2 minutes and with pharmacologic treatment from the anesthesia team. After recovery and treatment, the balloon was inflated again without incident. Local aspiration was performed at the intermediate catheter upon withdrawal of the device under fluoroscopic observation, with aspiration at the balloon guide. Once the retrieve her was entirely removed from the system, free aspiration was confirmed at the hub of the intermediate catheter, with free blood return confirmed Balloon guide was removed. Angio-Seal was deployed at the right common femoral artery. Flat panel CT was performed. Estimated blood loss approximately 50 cc. IMPRESSION: Status post ultrasound guided access right common femoral artery for cerebral angiogram and mechanical thrombectomy of acute emergent large vessel occlusion of the right MCA, achieving full TICI 3 reperfusion of the right MCA target vessel after 3 passes. Intracranial hemorrhage remote from the target vessel was identified on flat panel CT in the patient remained intubated. Signed, Yvone Neu. Reyne Dumas, RPVI [facility] Inter[facility] Radiology [facility] Radiology PLAN: Patient remained intubated. Formal CT now Admit to neuro ICU NIR to follow Electronically Signed   By: Gilmer Mor D.O.   On: 06/08/2018  06:11   Ir US Guide Vasc Access Right  Result Date: 06/08/2018 INDICATION: 84 year old male with a history of acute right MCA stroke, left-sided symptoms. Functional baseline with NIH stroke scale of 23 EXAM: ULTRASOUND GUIDED ACCESS RIGHT COMMON FEMORAL ARTERY CEREBRAL ANGIOGRAM MECHANICAL  THROMBECTOMY FOR EMERGENT LARGE VESSEL OCCLUSION RIGHT MCA DEPLOYMENT ANGIO-SEAL FOR HEMOSTASIS LIMITED HEAD CT COMPARISON:  CT imaging same day 2018-06-21 MEDICATIONS: 2 g Ancef. The antibiotic was administered within 1 hour of the procedure ANESTHESIA/SEDATION: General endotracheal tube anesthesia The patient was also continuously monitored during the procedure by the interventional radiology nurse under my direct supervision. CONTRAST:  100 cc Omni FLUOROSCOPY TIME:  Fluoroscopy Time: 35 minutes 30 seconds (1761 mGy). COMPLICATIONS: Intracranial hemorrhage identified on flat panel CT TECHNIQUE: Informed written consent was obtained from the patient's family after a thorough discussion of the procedural risks, benefits and alternatives. Specific risks discussed include: Bleeding, infection, contrast reaction, kidney injury/failure, need for further procedure/surgery, arterial injury or dissection, embolization to new territory, intracranial hemorrhage (10-15% risk), neurologic deterioration, cardiopulmonary collapse, death. All questions were addressed. Maximal Sterile Barrier Technique was utilized including during the procedure including caps, mask, sterile gowns, sterile gloves, sterile drape, hand hygiene and skin antiseptic. A timeout was performed prior to the initiation of the procedure. The anesthesia team was present to provide general endotracheal tube anesthesia and for patient monitoring during the procedure. Interventional neuro radiology nursing staff was also present. FINDINGS: Initial: Right common carotid artery:  Normal course caliber and contour. Right external carotid artery: Patent with antegrade flow. Right internal carotid artery: Normal course caliber and contour of the cervical portion. Vertical and petrous segment patent with normal course caliber contour. Cavernous segment patent. Clinoid segment patent. Antegrade flow of the ophthalmic artery. Ophthalmic segment patent. Terminus patent.  Fetal right PCA. Right MCA: Acute cut off of the proximal M1 beyond the terminus, with no flow through the segment and no perfusion of the expected territory. Right ACA: A 1 segment patent. A 2 segment perfuses the right territory. Final: After 3 passes with mechanical thrombectomy, there is full restoration of flow through the target MCA segment, TICI 3 flow. Flat panel CT: Flat panel CT demonstrates acute intracranial hemorrhage located away from the site of mechanical thrombectomy/embolectomy. No hemorrhage in the left sylvian region. Hemorrhage located in the pericallosal subarachnoid spaces and within the left para median frontal/parietal parenchyma. Additional site of hemorrhage within the left temporal lobe. Additional site of hemorrhage overlying the right frontal sulci within subarachnoid spaces. PROCEDURE: Patient is brought emergently to the neuro angiography suite, with the patient identified appropriately and placed supine position on the table. Left radial arterial line was placed by the anesthesia team. The patient is then prepped and draped in the usual sterile fashion. Ultrasound survey of the right inguinal region was performed with images stored and sent to PACs. 11 blade scalpel was used to make a small incision. Blunt dissection was performed. A micropuncture needle was used access the right common femoral artery under ultrasound. With excellent arterial blood flow returned, an .018 micro wire was passed through the needle, observed to enter the abdominal aorta under fluoroscopy. The needle was removed, and a micropuncture sheath was placed over the wire. The inner dilator and wire were removed, and an 035 Bentson wire was advanced under fluoroscopy into the abdominal aorta. The sheath was removed and a standard 5 [place] vascular sheath was placed. The dilator was removed and the sheath was flushed. A 43F JB-1 diagnostic catheter  was advanced over the wire to the proximal descending thoracic  aorta. Wire was then removed. Double flush of the catheter was performed. Catheter was then used to select the right common carotid artery. Formal angiogram was performed, with roadmap achieved. Exchange length Rosen wire was then passed through the diagnostic catheter to the cervical ICA and the diagnostic catheter was removed. The 5 French sheath was removed and exchanged for 8 French 55 centimeter BrightTip sheath. Sheath was flushed and attached to pressurized and heparinized saline bag for constant forward flow. Then a coaxial intermediate catheter and microcatheter combination was prepared on the back table. This combination was 115cm CAT-5 catheter and a Trevo Provue18 microcatheter, with a synchro soft wire. This combination was then advanced through the balloon guide into the ICA. System was advanced into the internal carotid artery, to the level of the occlusion. The micro wire was then carefully advanced through the occluded segment. Microcatheter was then pushed through the occluded segment and the wire was removed. Intermediate catheter was advanced over the micro wire to the M1 segment. Blood was then aspirated through the hub of the microcatheter, and a gentle contrast injection was performed confirming intraluminal position. A rotating hemostatic valve was then attached to the back end of the microcatheter, and a pressurized and heparinized saline bag was attached to the catheter. 5 x 33 Embotrap device was then selected. Back flush was achieved at the rotating hemostatic valve, and then the device was gently advanced through the microcatheter to the distal end. The retriever was then unsheathed by withdrawing the microcatheter under fluoroscopy. Once the retriever was completely unsheathed, the microcatheter was carefully stripped from the delivery wire of the device. Control angiogram was then performed from the balloon catheter. 3 minute time interval was observed. The balloon on the balloon guide  was then inflated to profile of the vessel. Constant aspiration was then performed through the intermediate catheter, and constant gentle aspiration was performed at the balloon guide. This aspiration was continued as the retriever was gently and slowly withdrawn with fluoroscopic observation into the distal intermediate catheter, which given the length, was within the intracranial ICA and not the MCA. The entire system was then gently withdrawn from the intracranial ICA and into the balloon guide. Once the retriever was entirely removed from the system, free aspiration was confirmed at the hub of the balloon guide, with free blood return confirmed. Control angiogram was then performed. Persistent occlusion at the proximal MCA was confirmed. The microcatheter and microwire were again used, with a new CAT 5 132 cm length intermediate catheter. Baseline angiogram demonstrated partial reperfusion at the site of the occlusion at this time,TICI 2a. Microwire and microcatheter were again advanced beyond the occlusion, the micro wire was removed and stentreiver device was deployed, stripping the microcatheter from the wire. After the device was deployed across the occlusion, the intermediate catheter was placed at the site of occlusion. The balloon at the balloon guide was inflated to profile of the vessel. Local aspiration was performed at the intermediate catheter upon withdrawal of the device under fluoroscopic observation, with aspiration at the balloon guide. Once the retrieve her was entirely removed from the system, free aspiration was confirmed at the hub of the intermediate catheter, with free blood return confirmed. Control angiogram was again performed. Persistent occlusion at the dominant MCA branches confirmed, with unchanged TICI 2a flow. The microcatheter and microwire were again used, with the same CAT 5 132 cm length intermediate catheter. Microwire and microcatheter  were again advanced beyond the occluded  segments, the micro wire was removed and a new stent tree for device was selected. At this time we deployed a 4 mm x 40 mm solitaire platinum device, stripping the microcatheter from the delivery wire. After the device was deployed across the occlusion, the intermediate catheter was placed at the site of occlusion. 3 minutes interval was observed. The balloon at the balloon guide was then inflated to profile of the vessel. At this time, the patient experienced asystole, treated by deflating the balloon for 2 minutes and with pharmacologic treatment from the anesthesia team. After recovery and treatment, the balloon was inflated again without incident. Local aspiration was performed at the intermediate catheter upon withdrawal of the device under fluoroscopic observation, with aspiration at the balloon guide. Once the retrieve her was entirely removed from the system, free aspiration was confirmed at the hub of the intermediate catheter, with free blood return confirmed Balloon guide was removed. Angio-Seal was deployed at the right common femoral artery. Flat panel CT was performed. Estimated blood loss approximately 50 cc. IMPRESSION: Status post ultrasound guided access right common femoral artery for cerebral angiogram and mechanical thrombectomy of acute emergent large vessel occlusion of the right MCA, achieving full TICI 3 reperfusion of the right MCA target vessel after 3 passes. Intracranial hemorrhage remote from the target vessel was identified on flat panel CT in the patient remained intubated. Signed, Yvone Neu. Reyne Dumas, RPVI Vascular and Interventional Radiology Specialists [facility] Radiology PLAN: Patient remained intubated. Formal CT now Admit to neuro ICU NIR to follow Electronically Signed   By: Gilmer Mor D.O.   On: 06/08/2018 06:11   Portable Chest Xray  Result Date: 06/08/2018 CLINICAL DATA:  Respiratory failure. EXAM: PORTABLE CHEST 1 VIEW COMPARISON:  Chest x-ray 05/27/2018.  CT  06/09/2018. FINDINGS: Endotracheal tube noted in stable position. Heart size normal. Bibasilar atelectasis/infiltrates. Biapical pleural-parenchymal thickening noted possibly related to scarring. Active infiltrates in these regions cannot be excluded. IMPRESSION: 1.  Endotracheal tube in stable position. 2. Bibasilar atelectasis and infiltrates, new from prior. Biapical pleural-parenchymal thickening noted possibly related to scarring. Similar findings noted on prior exam. Active infiltrates in these regions cannot be excluded. Electronically Signed   By: Maisie Fus  Register   On: 06/08/2018 10:18   Dg Chest Port 1 View  Result Date: 06/03/2018 CLINICAL DATA:  84 y/o  M; assess ET tube position. EXAM: PORTABLE CHEST 1 VIEW COMPARISON:  None. FINDINGS: Normal cardiac silhouette. Aortic atherosclerosis with calcification. Endotracheal tube tip projects 4.7 cm above carina. No focal consolidation. No pleural effusion or pneumothorax. No acute osseous abnormality is evident. IMPRESSION: No active disease.  Endotracheal tube tip 4.8 cm above the carina. Electronically Signed   By: Mitzi Hansen M.D.   On: 06/06/2018 22:37   Ir Percutaneous Art Thrombectomy/infusion Intracranial Inc Diag Angio  Result Date: 06/08/2018 INDICATION: 84 year old male with a history of acute right MCA stroke, left-sided symptoms. Functional baseline with NIH stroke scale of 23 EXAM: ULTRASOUND GUIDED ACCESS RIGHT COMMON FEMORAL ARTERY CEREBRAL ANGIOGRAM MECHANICAL THROMBECTOMY FOR EMERGENT LARGE VESSEL OCCLUSION RIGHT MCA DEPLOYMENT ANGIO-SEAL FOR HEMOSTASIS LIMITED HEAD CT COMPARISON:  CT imaging same day 06/06/2018 MEDICATIONS: 2 g Ancef. The antibiotic was administered within 1 hour of the procedure ANESTHESIA/SEDATION: General endotracheal tube anesthesia The patient was also continuously monitored during the procedure by the interventional radiology nurse under my direct supervision. CONTRAST:  100 cc Omni FLUOROSCOPY  TIME:  Fluoroscopy Time: 35 minutes 30 seconds (1761  mGy). COMPLICATIONS: Intracranial hemorrhage identified on flat panel CT TECHNIQUE: Informed written consent was obtained from the patient's family after a thorough discussion of the procedural risks, benefits and alternatives. Specific risks discussed include: Bleeding, infection, contrast reaction, kidney injury/failure, need for further procedure/surgery, arterial injury or dissection, embolization to new territory, intracranial hemorrhage (10-15% risk), neurologic deterioration, cardiopulmonary collapse, death. All questions were addressed. Maximal Sterile Barrier Technique was utilized including during the procedure including caps, mask, sterile gowns, sterile gloves, sterile drape, hand hygiene and skin antiseptic. A timeout was performed prior to the initiation of the procedure. The anesthesia team was present to provide general endotracheal tube anesthesia and for patient monitoring during the procedure. Interventional neuro radiology nursing staff was also present. FINDINGS: Initial: Right common carotid artery:  Normal course caliber and contour. Right external carotid artery: Patent with antegrade flow. Right internal carotid artery: Normal course caliber and contour of the cervical portion. Vertical and petrous segment patent with normal course caliber contour. Cavernous segment patent. Clinoid segment patent. Antegrade flow of the ophthalmic artery. Ophthalmic segment patent. Terminus patent. Fetal right PCA. Right MCA: Acute cut off of the proximal M1 beyond the terminus, with no flow through the segment and no perfusion of the expected territory. Right ACA: A 1 segment patent. A 2 segment perfuses the right territory. Final: After 3 passes with mechanical thrombectomy, there is full restoration of flow through the target MCA segment, TICI 3 flow. Flat panel CT: Flat panel CT demonstrates acute intracranial hemorrhage located away from the site of  mechanical thrombectomy/embolectomy. No hemorrhage in the left sylvian region. Hemorrhage located in the pericallosal subarachnoid spaces and within the left para median frontal/parietal parenchyma. Additional site of hemorrhage within the left temporal lobe. Additional site of hemorrhage overlying the right frontal sulci within subarachnoid spaces. PROCEDURE: Patient is brought emergently to the neuro angiography suite, with the patient identified appropriately and placed supine position on the table. Left radial arterial line was placed by the anesthesia team. The patient is then prepped and draped in the usual sterile fashion. Ultrasound survey of the right inguinal region was performed with images stored and sent to PACs. 11 blade scalpel was used to make a small incision. Blunt dissection was performed. A micropuncture needle was used access the right common femoral artery under ultrasound. With excellent arterial blood flow returned, an .018 micro wire was passed through the needle, observed to enter the abdominal aorta under fluoroscopy. The needle was removed, and a micropuncture sheath was placed over the wire. The inner dilator and wire were removed, and an 035 Bentson wire was advanced under fluoroscopy into the abdominal aorta. The sheath was removed and a standard 5 [place] vascular sheath was placed. The dilator was removed and the sheath was flushed. A 76F JB-1 diagnostic catheter was advanced over the wire to the proximal descending thoracic aorta. Wire was then removed. Double flush of the catheter was performed. Catheter was then used to select the right common carotid artery. Formal angiogram was performed, with roadmap achieved. Exchange length Rosen wire was then passed through the diagnostic catheter to the cervical ICA and the diagnostic catheter was removed. The 5 French sheath was removed and exchanged for 8 French 55 centimeter BrightTip sheath. Sheath was flushed and attached to pressurized  and heparinized saline bag for constant forward flow. Then a coaxial intermediate catheter and microcatheter combination was prepared on the back table. This combination was 115cm CAT-5 catheter and a Trevo Provue18 microcatheter, with a synchro  soft wire. This combination was then advanced through the balloon guide into the ICA. System was advanced into the internal carotid artery, to the level of the occlusion. The micro wire was then carefully advanced through the occluded segment. Microcatheter was then pushed through the occluded segment and the wire was removed. Intermediate catheter was advanced over the micro wire to the M1 segment. Blood was then aspirated through the hub of the microcatheter, and a gentle contrast injection was performed confirming intraluminal position. A rotating hemostatic valve was then attached to the back end of the microcatheter, and a pressurized and heparinized saline bag was attached to the catheter. 5 x 33 Embotrap device was then selected. Back flush was achieved at the rotating hemostatic valve, and then the device was gently advanced through the microcatheter to the distal end. The retriever was then unsheathed by withdrawing the microcatheter under fluoroscopy. Once the retriever was completely unsheathed, the microcatheter was carefully stripped from the delivery wire of the device. Control angiogram was then performed from the balloon catheter. 3 minute time interval was observed. The balloon on the balloon guide was then inflated to profile of the vessel. Constant aspiration was then performed through the intermediate catheter, and constant gentle aspiration was performed at the balloon guide. This aspiration was continued as the retriever was gently and slowly withdrawn with fluoroscopic observation into the distal intermediate catheter, which given the length, was within the intracranial ICA and not the MCA. The entire system was then gently withdrawn from the  intracranial ICA and into the balloon guide. Once the retriever was entirely removed from the system, free aspiration was confirmed at the hub of the balloon guide, with free blood return confirmed. Control angiogram was then performed. Persistent occlusion at the proximal MCA was confirmed. The microcatheter and microwire were again used, with a new CAT 5 132 cm length intermediate catheter. Baseline angiogram demonstrated partial reperfusion at the site of the occlusion at this time,TICI 2a. Microwire and microcatheter were again advanced beyond the occlusion, the micro wire was removed and stentreiver device was deployed, stripping the microcatheter from the wire. After the device was deployed across the occlusion, the intermediate catheter was placed at the site of occlusion. The balloon at the balloon guide was inflated to profile of the vessel. Local aspiration was performed at the intermediate catheter upon withdrawal of the device under fluoroscopic observation, with aspiration at the balloon guide. Once the retrieve her was entirely removed from the system, free aspiration was confirmed at the hub of the intermediate catheter, with free blood return confirmed. Control angiogram was again performed. Persistent occlusion at the dominant MCA branches confirmed, with unchanged TICI 2a flow. The microcatheter and microwire were again used, with the same CAT 5 132 cm length intermediate catheter. Microwire and microcatheter were again advanced beyond the occluded segments, the micro wire was removed and a new stent tree for device was selected. At this time we deployed a 4 mm x 40 mm solitaire platinum device, stripping the microcatheter from the delivery wire. After the device was deployed across the occlusion, the intermediate catheter was placed at the site of occlusion. 3 minutes interval was observed. The balloon at the balloon guide was then inflated to profile of the vessel. At this time, the patient  experienced asystole, treated by deflating the balloon for 2 minutes and with pharmacologic treatment from the anesthesia team. After recovery and treatment, the balloon was inflated again without incident. Local aspiration was performed at the  intermediate catheter upon withdrawal of the device under fluoroscopic observation, with aspiration at the balloon guide. Once the retrieve her was entirely removed from the system, free aspiration was confirmed at the hub of the intermediate catheter, with free blood return confirmed Balloon guide was removed. Angio-Seal was deployed at the right common femoral artery. Flat panel CT was performed. Estimated blood loss approximately 50 cc. IMPRESSION: Status post ultrasound guided access right common femoral artery for cerebral angiogram and mechanical thrombectomy of acute emergent large vessel occlusion of the right MCA, achieving full TICI 3 reperfusion of the right MCA target vessel after 3 passes. Intracranial hemorrhage remote from the target vessel was identified on flat panel CT in the patient remained intubated. Signed, Yvone Neu. Reyne Dumas, RPVI [facility] Radiology PLAN: Patient remained intubated. Formal CT now Admit to neuro ICU NIR to follow Electronically Signed   By: Gilmer Mor D.O.   On: 06/08/2018 06:11   Ct Head Code Stroke Wo Contrast  Result Date: 2018/07/02 CLINICAL DATA:  Code stroke.  Left-sided weakness and slurred speech EXAM: CT HEAD WITHOUT CONTRAST TECHNIQUE: Contiguous axial images were obtained from the base of the skull through the vertex without intravenous contrast. COMPARISON:  Head CT 05/28/2018 FINDINGS: Brain: There is no mass, hemorrhage or extra-axial collection. The size and configuration of the ventricles and extra-axial CSF spaces are normal. 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 No evidence of acute cortical infarct. There is hypoattenuation of the periventricular white matter, most  commonly indicating chronic ischemic microangiopathy. Vascular: No abnormal hyperdensity of the major intracranial arteries or dural venous sinuses. No intracranial atherosclerosis. Skull: The visualized skull base, calvarium and extracranial soft tissues are normal. Sinuses/Orbits: No fluid levels or advanced mucosal thickening of the visualized paranasal sinuses. No mastoid or middle ear effusion. The orbits are normal. ASPECTS [facility] Stroke Program Early CT Score) - Ganglionic level infarction (caudate, lentiform nuclei, internal capsule, insula, M1-M3 cortex): 7 - Supraganglionic infarction (M4-M6 cortex): 3 Total score (0-10 with 10 being normal): 10 IMPRESSION: 1. No acute hemorrhage or mass lesion. 2. ASPECTS is 10. 3. 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 These results were called by telephone at the time of interpretation on July 02, 2018 at 4:09 pm to Dr. Caryl Pina , who verbally acknowledged these results. Electronically Signed   By: Deatra Robinson M.D.   On: Jul 02, 2018 16:24    Labs:  CBC: Recent Labs    05/31/18 0528 July 02, 2018 1600 2018-07-02 1606 06/08/18 0435 06/09/18 0313  WBC 12.0* 12.1*  --  12.7* 11.3*  HGB 12.1* 11.7* 12.2* 9.6* 9.8*  HCT 36.1* 35.9* 36.0* 29.3* 30.0*  PLT 401* 495*  --  407* 387    COAGS: Recent Labs    07/02/18 1600  INR 1.26  APTT 32    BMP: Recent Labs    06/02/18 0530 Jul 02, 2018 1600 2018/07/02 1606 06/08/18 0435 06/09/18 0313  NA 138 136 136 140 139  K 3.3* 4.1 4.1 3.8 3.7  CL 106 100 103 109 107  CO2 23 24  --  22 18*  GLUCOSE 96 101* 94 109* 91  BUN 16 24* 27* 17 15  CALCIUM 7.9* 8.5*  --  7.9* 7.9*  CREATININE 0.67 0.95 0.90 0.79 0.76  GFRNONAA >60 >60  --  >60 >60  GFRAA >60 >60  --  >60 >60    LIVER FUNCTION TESTS: Recent Labs    05/28/18 0301 05/29/18 0519 07/02/18 1600  BILITOT  0.9 0.5 0.9  AST 51* 42* 24  ALT 34 33 19  ALKPHOS 69 69 70  PROT 5.7* 5.2* 5.9*  ALBUMIN 2.6*  2.3* 2.9*    Assessment and Plan:  Right MCA M1 segment occlusion s/p revascularization using mechanical thrombectomy 05/30/2018 with Dr. Loreta Ave. Patient's condition stable- remains intubated, withdraws from pain in all extremities. Right groin incision stable. Appreciate and agree with neurology management. Please call IR with questions/concerns.   Electronically Signed: Elwin Mocha, PA-C 06/09/2018, 10:28 AM   I spent a total of 15 Minutes at the the patient's bedside AND on the patient's hospital floor or unit, greater than 50% of which was counseling/coordinating care for right MCA M1 segment occlusion s/p revascularization.

## 2018-06-09 NOTE — Progress Notes (Signed)
NAME:  Justin [name], MRN:  [id number], DOB:  10-25-1927, LOS: 2 ADMISSION DATE:  06/13/2018, CONSULTATION DATE:  06/03/2018 REFERRING MD:  Otelia LimesLINDZEN MD CHIEF COMPLAINT: Left sided flaccidity and aphasia  Brief History   84 yo male former smoker with aphasia, Rt gaze, Lt side weakness.  CT angio head/neck showed Rt MCA M1 occlusion.  Tx with tPA and then neuro IR did mechanical thrombectomy.  Developed ICH, SAH, IVH after procedure.  Intubated for airway protection.  He was previously on xarelto for hx of DVT, but recently stopped due to leg infections.   Significant Hospital Events   9/23 Admit, ETT, A-line, Neuro IR, reversal TPA  Consults: date of consult/date signed off & final recs:  9/23 Neuro IR   Procedures (surgical and bedside):  9/23 ETT >>  9/23 Neuro IR- thrombectomy/ revascularization  Significant Diagnostic Tests:  CT head 9/23 >> old Lt basal ganglia infarct, chronic ischemic microangiopathy CT angio head/neck 9/23 >> Rt MCA M1 occlusion, apical lung fibrosis CT head 9/24 >> decompression of Lt frontal lobe hemorrhage into ventricular system, stable SAH; frontal/parietal, occipital, lt temporal lobe hemorrhage MRI brain 9/24 >> large Acute Rt MCA infarct involving cortex and basal ganglia; b/l parenchymal, SH, and IVH; moderate atrophy and small vessel ischemic disease Doppler legs 9/24 >> no DVT  Micro Data: Blood 9/24 >> Urine 9/24 >> negative  Antimicrobials:  Rocephin 9/23 >>   Subjective:  Not on any sedation.  Objective   Blood pressure (!) 146/76, pulse 76, temperature 99.7 F (37.6 C), temperature source Axillary, resp. rate 15, height 5\' 10"  (1.778 m), weight 78.1 kg, SpO2 98 %.    Vent Mode: PRVC FiO2 (%):  [30 %-40 %] 30 % Set Rate:  [14 bmp] 14 bmp Vt Set:  [580 mL] 580 mL PEEP:  [5 cmH20] 5 cmH20 Plateau Pressure:  [9 cmH20-12 cmH20] 11 cmH20   Intake/Output Summary (Last 24 hours) at 06/09/2018 [id number] Last data filed at 06/09/2018 0700 Gross  per 24 hour  Intake 2156.93 ml  Output 650 ml  Net 1506.93 ml   Filed Weights   05/17/2018 1617  Weight: 78.1 kg    Examination:  General - unresponsive Eyes - pupils reactive ENT - ETT in place Cardiac - regular rate/rhythm, no murmur Chest - equal breath sounds b/l, no wheezing or rales Abdomen - soft, non tender, + bowel sounds GU - foley in place Extremities - 1+ edema Skin - sacral pressure wound, b/l lower leg ulcers, multiple areas of ecchymosis Neuro - grimaces with stimulation, has gag reflex, not following commands  Assessment & Plan:   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 ICH, SAH, and IVH after procedure. - neuro to assess prognosis - defer when to add antiplatelet therapy to neurology  Compromised airway with acute hypoxic respiratory failure. Fibrotic changes on chest imaging. - can wean on vent, but mental status precludes extubation trial  Hypertension. - goal SBP < 140  Sacral wound, and lower leg ulcers. - present prior to admission - day 2 of rocephin - cleanse b/l legs with soap and water, pat dry, apply aquacel Ag to nonintact areas, wrap with kerlix and tape, change Tuesday/Friday  Anemia of critical illness. - f/u CBC  Chronic foley. - monitor urine outpt    Disposition / Summary of Today's Plan 06/09/18   Will have cortrak placed and start tube feeds.  Can do pressure support weaning.  Will need to d/w neurology about prognosis for meaningful  recovery.    Diet: tube feeds Pain/Anxiety/Delirium protocol: prn versed, fentanyl DVT prophylaxis:SCD if able with leg wounds GI prophylaxis: pepcid Mobility: bed rest  Code Status: full  Family Communication: no family at bedside  Labs   CBC: Recent Labs  Lab 06/22/2018 1600 06-22-2018 1606 06/08/18 0435 06/09/18 0313  WBC 12.1*  --  12.7* 11.3*  NEUTROABS 5.8  --   --   --   HGB 11.7* 12.2* 9.6* 9.8*  HCT 35.9* 36.0* 29.3* 30.0*  MCV 96.0  --  95.4 96.8  PLT 495*  --  407*  387   Basic Metabolic Panel: Recent Labs  Lab 2018/06/22 1600 2018-06-22 1606 June 22, 2018 2052 06/08/18 0435 06/09/18 0313  NA 136 136  --  140 139  K 4.1 4.1  --  3.8 3.7  CL 100 103  --  109 107  CO2 24  --   --  22 18*  GLUCOSE 101* 94  --  109* 91  BUN 24* 27*  --  17 15  CREATININE 0.95 0.90  --  0.79 0.76  CALCIUM 8.5*  --   --  7.9* 7.9*  MG  --   --  1.8  --   --   PHOS  --   --  3.9  --   --    GFR: Estimated Creatinine Clearance: 65.9 mL/min (by C-G formula based on SCr of 0.76 mg/dL). Recent Labs  Lab 06/22/2018 1600 06/08/18 0435 06/09/18 0313  WBC 12.1* 12.7* 11.3*   Liver Function Tests: Recent Labs  Lab 06/22/2018 1600  AST 24  ALT 19  ALKPHOS 70  BILITOT 0.9  PROT 5.9*  ALBUMIN 2.9*   No results for input(s): LIPASE, AMYLASE in the last 168 hours. No results for input(s): AMMONIA in the last 168 hours. ABG    Component Value Date/Time   PHART 7.421 06/08/2018 0452   PCO2ART 32.2 06/08/2018 0452   PO2ART 118.0 (H) 06/08/2018 0452   HCO3 20.9 06/08/2018 0452   TCO2 22 06/08/2018 0452   ACIDBASEDEF 3.0 (H) 06/08/2018 0452   O2SAT 99.0 06/08/2018 0452    Coagulation Profile: Recent Labs  Lab 2018-06-22 1600  INR 1.26   Cardiac Enzymes: No results for input(s): CKTOTAL, CKMB, CKMBINDEX, TROPONINI in the last 168 hours. HbA1C: Hgb A1c MFr Bld  Date/Time Value Ref Range Status  06/08/2018 04:35 AM 6.2 (H) 4.8 - 5.6 % Final    Comment:    (NOTE) Pre diabetes:          5.7%-6.4% Diabetes:              >6.4% Glycemic control for   <7.0% adults with diabetes    CBG: No results for input(s): GLUCAP in the last 168 hours.  CC time 31 minutes  Coralyn Helling, MD [facility] Pulmonary/Critical Care 06/09/2018, 8:06 AM

## 2018-06-09 NOTE — Progress Notes (Signed)
RT note-Patient has weaned al day, placed back to full support at this time.

## 2018-06-09 NOTE — Progress Notes (Addendum)
STROKE TEAM PROGRESS NOTE   SUBJECTIVE (INTERVAL HISTORY) No family is at the bedside. Pt still intubated not on sedation. Able to open eyes with pain stimulation but not interactive or following commands. Off neo and BP stable. No DVT. Withdraw to pain BLEs but minimal movement BUEs. Discussed with wife over the phone, updated pt condition to her, she is OK with DNR and would like me to call her son for update. I called Ebony Cargo at [phone] but he is not available and left message.    OBJECTIVE Temp:  [98.9 F (37.2 C)-99.7 F (37.6 C)] 98.9 F (37.2 C) (09/25 1200) Pulse Rate:  [53-83] 77 (09/25 1200) Cardiac Rhythm: Normal sinus rhythm (09/25 1200) Resp:  [10-21] 15 (09/25 1200) BP: (114-150)/(53-77) 148/71 (09/25 1200) SpO2:  [97 %-100 %] 100 % (09/25 1200) Arterial Line BP: (83-105)/(79-101) 83/79 (09/24 1400) FiO2 (%):  [30 %-40 %] 30 % (09/25 1200)  No results for input(s): GLUCAP in the last 168 hours. Recent Labs  Lab 05-Jul-2018 1600 Jul 05, 2018 1606 Jul 05, 2018 2052 06/08/18 0435 06/09/18 0313 06/09/18 0901  NA 136 136  --  140 139  --   K 4.1 4.1  --  3.8 3.7  --   CL 100 103  --  109 107  --   CO2 24  --   --  22 18*  --   GLUCOSE 101* 94  --  109* 91  --   BUN 24* 27*  --  17 15  --   CREATININE 0.95 0.90  --  0.79 0.76  --   CALCIUM 8.5*  --   --  7.9* 7.9*  --   MG  --   --  1.8  --   --  1.6*  PHOS  --   --  3.9  --   --  2.0*   Recent Labs  Lab 07/05/2018 1600  AST 24  ALT 19  ALKPHOS 70  BILITOT 0.9  PROT 5.9*  ALBUMIN 2.9*   Recent Labs  Lab 07/05/18 1600 07-05-18 1606 06/08/18 0435 06/09/18 0313  WBC 12.1*  --  12.7* 11.3*  NEUTROABS 5.8  --   --   --   HGB 11.7* 12.2* 9.6* 9.8*  HCT 35.9* 36.0* 29.3* 30.0*  MCV 96.0  --  95.4 96.8  PLT 495*  --  407* 387   No results for input(s): CKTOTAL, CKMB, CKMBINDEX, TROPONINI in the last 168 hours. Recent Labs    07/05/18 1600  LABPROT 15.7*  INR 1.26   Recent Labs    2018/07/05 2342  06/08/18 1029  COLORURINE STRAW* YELLOW  LABSPEC 1.021 1.030  PHURINE 6.0 5.0  GLUCOSEU NEGATIVE NEGATIVE  HGBUR NEGATIVE SMALL*  BILIRUBINUR NEGATIVE NEGATIVE  KETONESUR 5* 20*  PROTEINUR NEGATIVE NEGATIVE  NITRITE NEGATIVE NEGATIVE  LEUKOCYTESUR NEGATIVE NEGATIVE       Component Value Date/Time   CHOL 103 06/08/2018 0435   TRIG 90 06/08/2018 0435   HDL 24 (L) 06/08/2018 0435   CHOLHDL 4.3 06/08/2018 0435   VLDL 18 06/08/2018 0435   LDLCALC 61 06/08/2018 0435   Lab Results  Component Value Date   HGBA1C 6.2 (H) 06/08/2018      Component Value Date/Time   LABOPIA NONE DETECTED 09/26/2012 0034   COCAINSCRNUR NONE DETECTED 09/26/2012 0034   LABBENZ NONE DETECTED 09/26/2012 0034   AMPHETMU NONE DETECTED 09/26/2012 0034   THCU NONE DETECTED 09/26/2012 0034   LABBARB NONE DETECTED 09/26/2012 0034    No results  for input(s): ETH in the last 168 hours.  I have personally reviewed the radiological images below and agree with the radiology interpretations.  Ct Angio Head W Or Wo Contrast  Result Date: 05/22/2018 CLINICAL DATA:  Left-sided weakness and slurred speech. EXAM: CT ANGIOGRAPHY HEAD AND NECK TECHNIQUE: Multidetector CT imaging of the head and neck was performed using the standard protocol during bolus administration of intravenous contrast. Multiplanar CT image reconstructions and MIPs were obtained to evaluate the vascular anatomy. Carotid stenosis measurements (when applicable) are obtained utilizing NASCET criteria, using the distal internal carotid diameter as the denominator. CONTRAST:  50mL ISOVUE-370 IOPAMIDOL (ISOVUE-370) INJECTION 76% COMPARISON:  Head CT same day FINDINGS: CTA NECK FINDINGS AORTIC ARCH: There is mild calcific atherosclerosis of the aortic arch. There is no aneurysm, dissection or hemodynamically significant stenosis of the visualized ascending aorta and aortic arch. Conventional 3 vessel aortic branching pattern. The visualized proximal subclavian  arteries are widely patent. RIGHT CAROTID SYSTEM: --Common carotid artery: Widely patent origin without common carotid artery dissection or aneurysm. --Internal carotid artery: No dissection, occlusion or aneurysm. No hemodynamically significant stenosis. --External carotid artery: No acute abnormality. LEFT CAROTID SYSTEM: --Common carotid artery: Widely patent origin without common carotid artery dissection or aneurysm. --Internal carotid artery:No dissection, occlusion or aneurysm. No hemodynamically significant stenosis. --External carotid artery: No acute abnormality. VERTEBRAL ARTERIES: Left dominant configuration. Both origins are normal. No dissection, occlusion or flow-limiting stenosis to the vertebrobasilar confluence. SKELETON: There is no bony spinal canal stenosis. No lytic or blastic lesion. OTHER NECK: Normal pharynx, larynx and major salivary glands. No cervical lymphadenopathy. Unremarkable thyroid gland. UPPER CHEST: Biapical opacities most suggestive of fibrosis or scarring. A superimposed mass or consolidation with difficult to exclude. CTA HEAD FINDINGS ANTERIOR CIRCULATION: --Intracranial internal carotid arteries: Normal. --Anterior cerebral arteries: Normal. Both A1 segments are present. Patent anterior communicating artery. --Middle cerebral arteries: Complete occlusion of the right middle cerebral artery M1 segment. No significant collateral flow within the right MCA territory. The left MCA is normal. --Posterior communicating arteries: Present on the right, absent on the left. POSTERIOR CIRCULATION: --Basilar artery: Normal. --Posterior cerebral arteries: Normal. --Superior cerebellar arteries: Normal. --Inferior cerebellar arteries: Normal anterior and posterior inferior cerebellar arteries. VENOUS SINUSES: As permitted by contrast timing, patent. ANATOMIC VARIANTS: None DELAYED PHASE: Not performed. Review of the MIP images confirms the above findings. IMPRESSION: 1. Emergent large  vessel occlusion of the right middle cerebral artery M1 segment with minimal collateral flow demonstrated in the right MCA territory. 2. Biapical pulmonary opacities most consistent with fibrosis or scarring. A superimposed mass or consolidation would be difficult to exclude. If prior chest CTs exists, comparison would be helpful. 3. No arterial occlusion or hemodynamically significant stenosis of the major arteries of the neck. Critical Value/emergent results were called by telephone at the time of interpretation on 05/25/2018 at 4:27 pm to Dr. Caryl Pina, who verbally acknowledged these results. Electronically Signed   By: Deatra Robinson M.D.   On: 06/14/2018 16:43   Ct Head Wo Contrast  Result Date: 06/08/2018 CLINICAL DATA:  84 y/o  M; follow-up of intracranial hemorrhage. EXAM: CT HEAD WITHOUT CONTRAST TECHNIQUE: Contiguous axial images were obtained from the base of the skull through the vertex without intravenous contrast. COMPARISON:  05/30/2018 CT head. FINDINGS: Brain: Multiple brain parenchymal hemorrhages within the paramedian frontal, parietal, and occipital lobes as well as the left temporal lobes with hematocrit levels. Large area of hemorrhage within the left paramedian posterior frontal cortex has decompressed into  the ventricular system in the interim. Brain parenchymal hemorrhage is otherwise stable. Stable moderate volume of diffuse subarachnoid hemorrhage over cerebral convexities and cerebellum. Mild interval increase in intraventricular hemorrhage likely redistributed from the left frontal parenchymal hematoma as above within new blood fluid level in the right lateral ventricle atria. Ventricle size is stable. No new acute intracranial hemorrhage, stroke, or focal mass effect. No herniation. Stable background of chronic microvascular ischemic changes and volume loss of the brain. Vascular: Calcific atherosclerosis of the carotid siphons. Skull: Normal. Negative for fracture or focal  lesion. Sinuses/Orbits: No acute finding. Other: None. IMPRESSION: 1. Interval decompression of left paramedian frontal lobe brain parenchymal hemorrhage into the ventricular system with increased blood products in both lateral ventricular atria. Stable ventricle size. 2. Otherwise stable brain parenchymal hemorrhage in paramedian frontal, parietal, and occipital lobes as well as the left temporal lobe. 3. Stable moderate volume of subarachnoid hemorrhage over convexities and cerebellum. 4. No new acute intracranial abnormality identified. Electronically Signed   By: Mitzi Hansen M.D.   On: 06/08/2018 00:54   Ct Head Wo Contrast  Result Date: 2018-07-01 CLINICAL DATA:  Stroke. Status post endovascular revascularization M1 occlusion and tPA. EXAM: CT HEAD WITHOUT CONTRAST TECHNIQUE: Contiguous axial images were obtained from the base of the skull through the vertex without intravenous contrast. COMPARISON:  CT HEAD 07-01-2018 at 1607 hours and procedural CT HEAD at 2018/07/01 1828 hours. FINDINGS: BRAIN: Multiple intraparenchymal hematomas including bilateral frontal parietal lobes, LEFT temporal lobe and likely within vermis. Regional mass effect of frontoparietal hematomas flattening the lateral ventricles. LEFT intraventricular blood products with hematocrit level. Suspected hematocrit levels within the hematomas, however there may be a component of contrast. Scattered extra-axial blood products predominately along the interhemispheric fissure and supra cerebellar cistern, cerebellar folia. Old LEFT occipital lobe infarct. No acute large vascular territory infarct. No hydrocephalus. Basal cisterns are patent. VASCULAR: Minimal calcific atherosclerosis of the carotid siphons. SKULL: No skull fracture. No significant scalp soft tissue swelling. SINUSES/ORBITS: Trace paranasal sinus mucosal thickening. Mastoid air cells are well aerated.The included ocular globes and orbital contents  are non-suspicious. OTHER: None. IMPRESSION: 1. Multiple new intraparenchymal hematomas with hematocrit levels consistent with acute and suspected active hemorrhage. 2. LEFT lateral intraventricular hemorrhage, possible parenchymal extension. 3. Scattered extra-axial blood products, confounded by postcontrast imaging. 4. Critical Value/emergent results were called by telephone at the time of interpretation on 2018-07-01 at 7:39 pm to Dr. Wilford Corner, Neurology, who verbally acknowledged these results. Electronically Signed   By: Awilda Metro M.D.   On: 01-Jul-2018 19:40   Ct Head Wo Contrast  Result Date: 05/28/2018 CLINICAL DATA:  Visual loss or uveitis/scleritis EXAM: CT HEAD WITHOUT CONTRAST TECHNIQUE: Contiguous axial images were obtained from the base of the skull through the vertex without intravenous contrast. COMPARISON:  Remote head CT 06/02/2005 FINDINGS: Brain: Progressive atrophy and chronic small vessel ischemia from 2006 exam. No intracranial hemorrhage, mass effect, or midline shift. No hydrocephalus. The basilar cisterns are patent. No evidence of territorial infarct or acute ischemia. No extra-axial or intracranial fluid collection. Vascular: No hyperdense vessel. Skull: No fracture or focal lesion. Sinuses/Orbits: Paranasal sinuses and mastoid air cells are clear. The visualized orbits are unremarkable. Other: None. IMPRESSION: 1.  No acute intracranial abnormality. 2. Atrophy with moderate to advanced chronic small vessel ischemia. Electronically Signed   By: Narda Rutherford M.D.   On: 05/28/2018 03:53   Ct Angio Neck W Or Wo Contrast  Result Date: 07/01/18 CLINICAL  DATA:  Left-sided weakness and slurred speech. EXAM: CT ANGIOGRAPHY HEAD AND NECK TECHNIQUE: Multidetector CT imaging of the head and neck was performed using the standard protocol during bolus administration of intravenous contrast. Multiplanar CT image reconstructions and MIPs were obtained to evaluate the vascular anatomy.  Carotid stenosis measurements (when applicable) are obtained utilizing NASCET criteria, using the distal internal carotid diameter as the denominator. CONTRAST:  50mL ISOVUE-370 IOPAMIDOL (ISOVUE-370) INJECTION 76% COMPARISON:  Head CT same day FINDINGS: CTA NECK FINDINGS AORTIC ARCH: There is mild calcific atherosclerosis of the aortic arch. There is no aneurysm, dissection or hemodynamically significant stenosis of the visualized ascending aorta and aortic arch. Conventional 3 vessel aortic branching pattern. The visualized proximal subclavian arteries are widely patent. RIGHT CAROTID SYSTEM: --Common carotid artery: Widely patent origin without common carotid artery dissection or aneurysm. --Internal carotid artery: No dissection, occlusion or aneurysm. No hemodynamically significant stenosis. --External carotid artery: No acute abnormality. LEFT CAROTID SYSTEM: --Common carotid artery: Widely patent origin without common carotid artery dissection or aneurysm. --Internal carotid artery:No dissection, occlusion or aneurysm. No hemodynamically significant stenosis. --External carotid artery: No acute abnormality. VERTEBRAL ARTERIES: Left dominant configuration. Both origins are normal. No dissection, occlusion or flow-limiting stenosis to the vertebrobasilar confluence. SKELETON: There is no bony spinal canal stenosis. No lytic or blastic lesion. OTHER NECK: Normal pharynx, larynx and major salivary glands. No cervical lymphadenopathy. Unremarkable thyroid gland. UPPER CHEST: Biapical opacities most suggestive of fibrosis or scarring. A superimposed mass or consolidation with difficult to exclude. CTA HEAD FINDINGS ANTERIOR CIRCULATION: --Intracranial internal carotid arteries: Normal. --Anterior cerebral arteries: Normal. Both A1 segments are present. Patent anterior communicating artery. --Middle cerebral arteries: Complete occlusion of the right middle cerebral artery M1 segment. No significant collateral flow  within the right MCA territory. The left MCA is normal. --Posterior communicating arteries: Present on the right, absent on the left. POSTERIOR CIRCULATION: --Basilar artery: Normal. --Posterior cerebral arteries: Normal. --Superior cerebellar arteries: Normal. --Inferior cerebellar arteries: Normal anterior and posterior inferior cerebellar arteries. VENOUS SINUSES: As permitted by contrast timing, patent. ANATOMIC VARIANTS: None DELAYED PHASE: Not performed. Review of the MIP images confirms the above findings. IMPRESSION: 1. Emergent large vessel occlusion of the right middle cerebral artery M1 segment with minimal collateral flow demonstrated in the right MCA territory. 2. Biapical pulmonary opacities most consistent with fibrosis or scarring. A superimposed mass or consolidation would be difficult to exclude. If prior chest CTs exists, comparison would be helpful. 3. No arterial occlusion or hemodynamically significant stenosis of the major arteries of the neck. Critical Value/emergent results were called by telephone at the time of interpretation on 05/25/2018 at 4:27 pm to Dr. Caryl Pina, who verbally acknowledged these results. Electronically Signed   By: Deatra Robinson M.D.   On: 05/23/2018 16:43   Dg Chest Port 1 View  Result Date: 05/18/2018 CLINICAL DATA:  84 y/o  M; assess ET tube position. EXAM: PORTABLE CHEST 1 VIEW COMPARISON:  None. FINDINGS: Normal cardiac silhouette. Aortic atherosclerosis with calcification. Endotracheal tube tip projects 4.7 cm above carina. No focal consolidation. No pleural effusion or pneumothorax. No acute osseous abnormality is evident. IMPRESSION: No active disease.  Endotracheal tube tip 4.8 cm above the carina. Electronically Signed   By: Mitzi Hansen M.D.   On: 06/12/2018 22:37   Ct Head Code Stroke Wo Contrast  Result Date: 06/14/2018 CLINICAL DATA:  Code stroke.  Left-sided weakness and slurred speech EXAM: CT HEAD WITHOUT CONTRAST TECHNIQUE:  Contiguous axial images were obtained from  the base of the skull through the vertex without intravenous contrast. COMPARISON:  Head CT 05/28/2018 FINDINGS: Brain: There is no mass, hemorrhage or extra-axial collection. The size and configuration of the ventricles and extra-axial CSF spaces are normal. 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 No evidence of acute cortical infarct. There is hypoattenuation of the periventricular white matter, most commonly indicating chronic ischemic microangiopathy. Vascular: No abnormal hyperdensity of the major intracranial arteries or dural venous sinuses. No intracranial atherosclerosis. Skull: The visualized skull base, calvarium and extracranial soft tissues are normal. Sinuses/Orbits: No fluid levels or advanced mucosal thickening of the visualized paranasal sinuses. No mastoid or middle ear effusion. The orbits are normal. ASPECTS [facility] Stroke Program Early CT Score) - Ganglionic level infarction (caudate, lentiform nuclei, internal capsule, insula, M1-M3 cortex): 7 - Supraganglionic infarction (M4-M6 cortex): 3 Total score (0-10 with 10 being normal): 10 IMPRESSION: 1. No acute hemorrhage or mass lesion. 2. ASPECTS is 10. 3. 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 These results were called by telephone at the time of interpretation on 2018-06-27 at 4:09 pm to Dr. Caryl Pina , who verbally acknowledged these results. Electronically Signed   By: Deatra Robinson M.D.   On: June 27, 2018 16:24   MRI and MRA 1. Acute large right MCA infarct primarily involving cortex and basal ganglia. 2. Bilateral parenchymal hemorrhages, subarachnoid hemorrhage, and intraventricular hemorrhage, not significantly changed from today's earlier CT. 3. Moderate cerebral atrophy and chronic small vessel ischemic disease. 4. Negative head MRA. Continued patency of the revascularized right MCA.  TTE pending  LE venous doppler Right: There is no evidence of  deep vein thrombosis in the lower extremity. However, portions of this examination were limited- see technologist comments above. Left: There is no evidence of deep vein thrombosis in the lower extremity. However, portions of this examination were limited- see technologist comments above.   PHYSICAL EXAM  Temp:  [98.9 F (37.2 C)-99.7 F (37.6 C)] 98.9 F (37.2 C) (09/25 1200) Pulse Rate:  [53-83] 77 (09/25 1200) Resp:  [10-21] 15 (09/25 1200) BP: (114-150)/(53-77) 148/71 (09/25 1200) SpO2:  [97 %-100 %] 100 % (09/25 1200) Arterial Line BP: (83-105)/(79-101) 83/79 (09/24 1400) FiO2 (%):  [30 %-40 %] 30 % (09/25 1200)  General - Well nourished, well developed, intubated not on sedation.  Ophthalmologic - fundi not visualized due to noncooperation.  Cardiovascular - Regular rate and rhythm.  Skin - b/l LEs foreleg b/l ulcers and wounds, with red/purple discoloration, on dressing.   Neuro - intubated not on sedation, on pressor. Eyes closed, open on strong pain stimulation. PERRL, eye mid position, sluggish doll's eyes but able to move to the right slowly, not to the left, no tracking, no blinking to visual threat bilaterally. Corneal weak on the left but positive on the right. Biting on the ET tubes, not able to obtain gag and cough. Facial symmetry not able to test due to ET tube. On pain stimulation, RUE 1/5 withdraw, RLE 2/5 withdraw, LLE 2/5 and LUE 1/5 withdraw. DTR diminished and no babinski. Sensation, coordination and gait not tested.   ASSESSMENT/PLAN Mr. Justin [name] is a 84 y.o. male with history of DVT on Xarelto but stopped recently after LE cellulitis, BPH on foley placed recently in [facility] admitted for aphasia, left facial droop, left weakness and neglect. tPA given.    Stroke:  right large MCA infarct mainly at cortex and BG due to right M1 occlusion s/p tPA and IR with TICI3 reperfusion,  embolic pattern, source unclear  Resultant intubated and left sided weakness and  right gaze preference  CT head no acute finding  CTA head and neck - right M1 occlusion  DSA - right M1 occlusion s/p TICI3 reperfusion  MRI  right large MCA infarct mainly at cortex and BG   MRA unremarkable after IR  2D Echo  pending  LDL 61  HgbA1c 6.2  none for VTE prophylaxis due to b/l LE ulcers and ICH  No antithrombotic prior to admission, now on No antithrombotic   Ongoing aggressive stroke risk factor management  Therapy recommendations:  Pending   Disposition:  Pending   Updated wife over the phone, she agrees with DNR and will consider comfort care later if no improvement as pt told her in the past, he would not be kept alive without QOL. Wife also would like me to call her son for update too. I called Ebony Cargo at [phone] but he is not available and left message.  ICH with IVH likely due to tPA s/p reversal  tPA reversed with TXA and cryo  CT post procedure showed b/l frontal ICH, left BG and temporal ICH with IVH and SAH  Repeat CT stable with left frontal/BG ICH decompressed to lateral ventricle  MRI confirmed ICH, IVH and SAH, no hydrocephalus. No significant CAA either.   Fibrinogen 370->446  LE venous stasis with ulcers and cellulitis and hx of DVT  Wound care on board  Has hx of DVT was on Xarelto at home but it was discontinued on recent discharge on 06/02/18.   LE venous doppler no DVT this admission  On rocephin   Blood culture pending  Leukocytosis   Blood culture pending  UA WBC 6-10  Urine culture pending  WBC 14->12.1->12.7->11.3  On rocephin  Hypertension/hypotension . Stable  BP goal < 160  Off cleviprex  Off neo  Labetalol PRN  Other Stroke Risk Factors  Advanced age  Other Active Problems  BPH - on foley catheter  Dysphagia - consider OG tube if needed  Hospital day # 2  This patient is critically ill due to right MCA infarct, ICH, SAH, IVH due to tPA, LE ulcers, hypotension and at significant  risk of neurological worsening, death form cerebral edema, brain herniation, recurrent stroke, hematoma expansion, DVT, paradoxical emboli. This patient's care requires constant monitoring of vital signs, hemodynamics, respiratory and cardiac monitoring, review of multiple databases, neurological assessment, discussion with family, other specialists and medical decision making of high complexity. I spent 45 minutes of neurocritical care time in the care of this patient. I had long discussion with wife over the phone, updated pt current condition, treatment plan and potential prognosis. She expressed understanding and appreciation. I also attempted to call his son but left message for him. I also discussed with Dr. Craige Cotta in the hallway.     Marvel Plan, MD PhD Stroke Neurology 06/09/2018 12:35 PM    To contact Stroke Continuity provider, please refer to [email or web address]. After hours, contact General Neurology

## 2018-06-09 NOTE — Progress Notes (Addendum)
Nutrition Follow-up  DOCUMENTATION CODES:   Non-severe (moderate) malnutrition in context of social or environmental circumstances  INTERVENTION:   Vital AF 1.2 @ 60 ml/hr (1440 ml)  Free water 30ml q 4 hrs   Provides: 1728 kcals, 108 grams protein, 1346 ml free water. Meets 100% of needs.  Ocuvite daily via tube for wound healing (provides zinc, vitamin A, vitamin C, Vitamin E, copper, and selenium)   NUTRITION DIAGNOSIS:   Moderate Malnutrition related to social / environmental circumstances(advanced age) as evidenced by moderate to severe muscle depletion, moderate fat depletion.  GOAL:   Provide needs based on ASPEN/SCCM guidelines  MONITOR:   Vent status, Labs, Weight trends, Skin, TF tolerance, I & O's  REASON FOR ASSESSMENT:   Consult Enteral/tube feeding initiation and management  ASSESSMENT:   Patient with PMH significant for venous stasis ulcers of both lower extremities and erysipelas. Recently admitted to [facility] for LE cellulitis. Presents this admission with aphagia, right gaze, and left hemiparesis. Admitted for right MCA M1 occlusion.   Pt sedated and ventilated. Pt s/p Cortrak tube placement today; x-ray confirms tube tip past the ligament of treitz. Plan to initiate tube feeds today. Ecchymosis noted on bilateral arms and pt with multiple wounds. RD will add ocuvite to encourage wound healing. Pt with low P and Mg and at refeeding risk; recommend monitor electrolytes until wnl.    Medications reviewed and include: NaCl @75ml /hr, ceftriaxone, pepcid  Labs reviewed: P 2.0(L), Mg 1.6(L) Wbc- 11.3(H), Hgb 9.8(L), Hct 30(L)  Patient is currently intubated on ventilator support MV: 10.2 L/min Temp (24hrs), Avg:99.3 F (37.4 C), Min:98.9 F (37.2 C), Max:99.7 F (37.6 C)  Propofol: none   MAP- >35mmHg  UOP- x 24 hrs  NUTRITION - FOCUSED PHYSICAL EXAM:    Most Recent Value  Orbital Region  Moderate depletion  Upper Arm Region  Unable to  assess  Thoracic and Lumbar Region  Moderate depletion  Buccal Region  Moderate depletion  Temple Region  Severe depletion  Clavicle Bone Region  Severe depletion  Clavicle and Acromion Bone Region  Severe depletion  Scapular Bone Region  Moderate depletion  Dorsal Hand  Unable to assess  Patellar Region  Moderate depletion  Anterior Thigh Region  Moderate depletion  Posterior Calf Region  Moderate depletion  Edema (RD Assessment)  Severe     Diet Order:   Diet Order            Diet NPO time specified  Diet effective now             EDUCATION NEEDS:   Not appropriate for education at this time  Skin:  Skin Assessment: Reviewed RN Assessment(incision groin, stage II buttocks, venous stasis ulcers BLE )  Last BM:  pta  Height:   Ht Readings from Last 1 Encounters:  2018-07-01 5\' 10"  (1.778 m)    Weight:   Wt Readings from Last 1 Encounters:  07/01/2018 78.1 kg    Ideal Body Weight:  75.4 kg  BMI:  Body mass index is 24.71 kg/m.  Estimated Nutritional Needs:   Kcal:  1715 kcal  Protein:  105-120 grams  Fluid:  >/= 1.7 L/day  Betsey Holiday MS, RD, LDN Pager #- [phone] Office#- [phone] After Hours Pager: [phone]

## 2018-06-09 NOTE — Plan of Care (Signed)
  Problem: Education: Goal: Knowledge of General Education information will improve Description Including pain rating scale, medication(s)/side effects and non-pharmacologic comfort measures Outcome: Not Progressing   Problem: Clinical Measurements: Goal: Ability to maintain clinical measurements within normal limits will improve Outcome: Not Progressing Goal: Will remain free from infection Outcome: Not Progressing Goal: Diagnostic test results will improve Outcome: Not Progressing Goal: Respiratory complications will improve Outcome: Progressing Note:  Patient intubated   Problem: Activity: Goal: Risk for activity intolerance will decrease Outcome: Not Progressing   Problem: Nutrition: Goal: Adequate nutrition will be maintained Outcome: Not Progressing   Problem: Coping: Goal: Level of anxiety will decrease Outcome: Not Progressing   Problem: Elimination: Goal: Will not experience complications related to bowel motility Outcome: Progressing   Problem: Safety: Goal: Ability to remain free from injury will improve Outcome: Progressing Note:  Patient not pulling on drains.    Problem: Skin Integrity: Goal: Risk for impaired skin integrity will decrease Outcome: Progressing

## 2018-06-09 NOTE — Progress Notes (Signed)
Magnesium of 6.7 is NOT  accurate. Patient was receiving IV mag when lab drew blood off same arm as IV.

## 2018-06-09 NOTE — Progress Notes (Signed)
Patient more awake during afternoon assessment. Patient was able to attempt to squeeze my hand with his left hand. Patient also now has a gag and cough reflex.

## 2018-06-09 NOTE — Progress Notes (Addendum)
Cortrak Tube Team Note:  Consult received to place a Cortrak feeding tube.   A 10 F Cortrak tube was placed in the right nare and secured with a nasal bridle at 90 cm. Per the Cortrak monitor reading the tube tip is post pyloric.   X-ray is required, abdominal x-ray has been ordered by the Cortrak team. Please confirm tube placement before using the Cortrak tube.   If the tube becomes dislodged please keep the tube and contact the Cortrak team at [email or web address] (password [email or web address]) for replacement.  If after hours and replacement cannot be delayed, place a NG tube and confirm placement with an abdominal x-ray.    Betsey Holiday MS, RD, LDN Pager #- [phone] Office#- [phone] After Hours Pager: [phone]

## 2018-06-09 NOTE — Progress Notes (Signed)
  Echocardiogram 2D Echocardiogram has been performed.  Justin [name] 06/09/2018, 3:51 PM

## 2018-06-10 ENCOUNTER — Inpatient Hospital Stay (HOSPITAL_COMMUNITY): Payer: Medicare Other

## 2018-06-10 DIAGNOSIS — Z4659 Encounter for fitting and adjustment of other gastrointestinal appliance and device: Secondary | ICD-10-CM

## 2018-06-10 DIAGNOSIS — I6389 Other cerebral infarction: Secondary | ICD-10-CM

## 2018-06-10 DIAGNOSIS — E440 Moderate protein-calorie malnutrition: Secondary | ICD-10-CM

## 2018-06-10 LAB — BASIC METABOLIC PANEL
ANION GAP: 7 (ref 5–15)
BUN: 11 mg/dL (ref 8–23)
CALCIUM: 8 mg/dL — AB (ref 8.9–10.3)
CO2: 23 mmol/L (ref 22–32)
Chloride: 106 mmol/L (ref 98–111)
Creatinine, Ser: 0.6 mg/dL — ABNORMAL LOW (ref 0.61–1.24)
Glucose, Bld: 156 mg/dL — ABNORMAL HIGH (ref 70–99)
POTASSIUM: 3.1 mmol/L — AB (ref 3.5–5.1)
Sodium: 136 mmol/L (ref 135–145)

## 2018-06-10 LAB — CBC
HCT: 29.7 % — ABNORMAL LOW (ref 39.0–52.0)
Hemoglobin: 10 g/dL — ABNORMAL LOW (ref 13.0–17.0)
MCH: 31.8 pg (ref 26.0–34.0)
MCHC: 33.7 g/dL (ref 30.0–36.0)
MCV: 94.6 fL (ref 78.0–100.0)
Platelets: 397 10*3/uL (ref 150–400)
RBC: 3.14 MIL/uL — ABNORMAL LOW (ref 4.22–5.81)
RDW: 13.8 % (ref 11.5–15.5)
WBC: 11 10*3/uL — ABNORMAL HIGH (ref 4.0–10.5)

## 2018-06-10 LAB — GLUCOSE, CAPILLARY
GLUCOSE-CAPILLARY: 129 mg/dL — AB (ref 70–99)
GLUCOSE-CAPILLARY: 139 mg/dL — AB (ref 70–99)
Glucose-Capillary: 127 mg/dL — ABNORMAL HIGH (ref 70–99)
Glucose-Capillary: 124 mg/dL — ABNORMAL HIGH (ref 70–99)
Glucose-Capillary: 127 mg/dL — ABNORMAL HIGH (ref 70–99)

## 2018-06-10 LAB — MAGNESIUM: MAGNESIUM: 1.7 mg/dL (ref 1.7–2.4)

## 2018-06-10 LAB — PHOSPHORUS: Phosphorus: 1.4 mg/dL — ABNORMAL LOW (ref 2.5–4.6)

## 2018-06-10 MED ORDER — FAMOTIDINE 40 MG/5ML PO SUSR
20.0000 mg | Freq: Two times a day (BID) | ORAL | Status: DC
  Administered 2018-06-10 – 2018-06-15 (×10): 20 mg
  Filled 2018-06-10 (×10): qty 2.5

## 2018-06-10 NOTE — Progress Notes (Addendum)
STROKE TEAM PROGRESS NOTE   SUBJECTIVE (INTERVAL HISTORY) No family is at the bedside. Pt still intubated not on sedation. Able to open eyes with pain stimulation but not interactive or following commands. Withdraw all extremities to pain stimulation. Talked with wife and son over the phone, updated pt condition. Wife is trying to come tomorrow.    OBJECTIVE Temp:  [98 F (36.7 C)-100.1 F (37.8 C)] 100.1 F (37.8 C) (09/26 1500) Pulse Rate:  [53-87] 72 (09/26 1600) Cardiac Rhythm: Normal sinus rhythm (09/26 0800) Resp:  [4-24] 15 (09/26 1600) BP: (109-161)/(56-84) 109/60 (09/26 1600) SpO2:  [93 %-100 %] 96 % (09/26 1600) FiO2 (%):  [30 %] 30 % (09/26 1431) Weight:  [80.3 kg] 80.3 kg (09/26 0500)  Recent Labs  Lab 06/10/18 0021 06/10/18 0407 06/10/18 0734 06/10/18 1151 06/10/18 1531  GLUCAP 129* 139* 127* 124* 127*   Recent Labs  Lab 05/18/2018 1600 05/20/2018 1606 06/09/2018 2052 06/08/18 0435 06/09/18 0313 06/09/18 0901 06/09/18 1730 06/10/18 0631  NA 136 136  --  140 139  --   --  136  K 4.1 4.1  --  3.8 3.7  --   --  3.1*  CL 100 103  --  109 107  --   --  106  CO2 24  --   --  22 18*  --   --  23  GLUCOSE 101* 94  --  109* 91  --   --  156*  BUN 24* 27*  --  17 15  --   --  11  CREATININE 0.95 0.90  --  0.79 0.76  --   --  0.60*  CALCIUM 8.5*  --   --  7.9* 7.9*  --   --  8.0*  MG  --   --  1.8  --   --  1.6* 6.7* 1.7  PHOS  --   --  3.9  --   --  2.0* 1.7* 1.4*   Recent Labs  Lab 06/06/2018 1600  AST 24  ALT 19  ALKPHOS 70  BILITOT 0.9  PROT 5.9*  ALBUMIN 2.9*   Recent Labs  Lab 06/11/2018 1600 05/27/2018 1606 06/08/18 0435 06/09/18 0313 06/10/18 0631  WBC 12.1*  --  12.7* 11.3* 11.0*  NEUTROABS 5.8  --   --   --   --   HGB 11.7* 12.2* 9.6* 9.8* 10.0*  HCT 35.9* 36.0* 29.3* 30.0* 29.7*  MCV 96.0  --  95.4 96.8 94.6  PLT 495*  --  407* 387 397   No results for input(s): CKTOTAL, CKMB, CKMBINDEX, TROPONINI in the last 168 hours. No results for  input(s): LABPROT, INR in the last 72 hours. Recent Labs    05/22/2018 2342 06/08/18 1029  COLORURINE STRAW* YELLOW  LABSPEC 1.021 1.030  PHURINE 6.0 5.0  GLUCOSEU NEGATIVE NEGATIVE  HGBUR NEGATIVE SMALL*  BILIRUBINUR NEGATIVE NEGATIVE  KETONESUR 5* 20*  PROTEINUR NEGATIVE NEGATIVE  NITRITE NEGATIVE NEGATIVE  LEUKOCYTESUR NEGATIVE NEGATIVE       Component Value Date/Time   CHOL 103 06/08/2018 0435   TRIG 90 06/08/2018 0435   HDL 24 (L) 06/08/2018 0435   CHOLHDL 4.3 06/08/2018 0435   VLDL 18 06/08/2018 0435   LDLCALC 61 06/08/2018 0435   Lab Results  Component Value Date   HGBA1C 6.2 (H) 06/08/2018      Component Value Date/Time   LABOPIA NONE DETECTED 09/26/2012 0034   COCAINSCRNUR NONE DETECTED 09/26/2012 0034   LABBENZ NONE DETECTED  09/26/2012 0034   AMPHETMU NONE DETECTED 09/26/2012 0034   THCU NONE DETECTED 09/26/2012 0034   LABBARB NONE DETECTED 09/26/2012 0034    No results for input(s): ETH in the last 168 hours.  I have personally reviewed the radiological images below and agree with the radiology interpretations.  Ct Angio Head W Or Wo Contrast  Result Date: 06-10-18 CLINICAL DATA:  Left-sided weakness and slurred speech. EXAM: CT ANGIOGRAPHY HEAD AND NECK TECHNIQUE: Multidetector CT imaging of the head and neck was performed using the standard protocol during bolus administration of intravenous contrast. Multiplanar CT image reconstructions and MIPs were obtained to evaluate the vascular anatomy. Carotid stenosis measurements (when applicable) are obtained utilizing NASCET criteria, using the distal internal carotid diameter as the denominator. CONTRAST:  50mL ISOVUE-370 IOPAMIDOL (ISOVUE-370) INJECTION 76% COMPARISON:  Head CT same day FINDINGS: CTA NECK FINDINGS AORTIC ARCH: There is mild calcific atherosclerosis of the aortic arch. There is no aneurysm, dissection or hemodynamically significant stenosis of the visualized ascending aorta and aortic arch.  Conventional 3 vessel aortic branching pattern. The visualized proximal subclavian arteries are widely patent. RIGHT CAROTID SYSTEM: --Common carotid artery: Widely patent origin without common carotid artery dissection or aneurysm. --Internal carotid artery: No dissection, occlusion or aneurysm. No hemodynamically significant stenosis. --External carotid artery: No acute abnormality. LEFT CAROTID SYSTEM: --Common carotid artery: Widely patent origin without common carotid artery dissection or aneurysm. --Internal carotid artery:No dissection, occlusion or aneurysm. No hemodynamically significant stenosis. --External carotid artery: No acute abnormality. VERTEBRAL ARTERIES: Left dominant configuration. Both origins are normal. No dissection, occlusion or flow-limiting stenosis to the vertebrobasilar confluence. SKELETON: There is no bony spinal canal stenosis. No lytic or blastic lesion. OTHER NECK: Normal pharynx, larynx and major salivary glands. No cervical lymphadenopathy. Unremarkable thyroid gland. UPPER CHEST: Biapical opacities most suggestive of fibrosis or scarring. A superimposed mass or consolidation with difficult to exclude. CTA HEAD FINDINGS ANTERIOR CIRCULATION: --Intracranial internal carotid arteries: Normal. --Anterior cerebral arteries: Normal. Both A1 segments are present. Patent anterior communicating artery. --Middle cerebral arteries: Complete occlusion of the right middle cerebral artery M1 segment. No significant collateral flow within the right MCA territory. The left MCA is normal. --Posterior communicating arteries: Present on the right, absent on the left. POSTERIOR CIRCULATION: --Basilar artery: Normal. --Posterior cerebral arteries: Normal. --Superior cerebellar arteries: Normal. --Inferior cerebellar arteries: Normal anterior and posterior inferior cerebellar arteries. VENOUS SINUSES: As permitted by contrast timing, patent. ANATOMIC VARIANTS: None DELAYED PHASE: Not performed.  Review of the MIP images confirms the above findings. IMPRESSION: 1. Emergent large vessel occlusion of the right middle cerebral artery M1 segment with minimal collateral flow demonstrated in the right MCA territory. 2. Biapical pulmonary opacities most consistent with fibrosis or scarring. A superimposed mass or consolidation would be difficult to exclude. If prior chest CTs exists, comparison would be helpful. 3. No arterial occlusion or hemodynamically significant stenosis of the major arteries of the neck. Critical Value/emergent results were called by telephone at the time of interpretation on 06-10-2018 at 4:27 pm to Dr. Caryl Pina, who verbally acknowledged these results. Electronically Signed   By: Deatra Robinson M.D.   On: 2018/06/10 16:43   Ct Head Wo Contrast  Result Date: 06/08/2018 CLINICAL DATA:  84 y/o  M; follow-up of intracranial hemorrhage. EXAM: CT HEAD WITHOUT CONTRAST TECHNIQUE: Contiguous axial images were obtained from the base of the skull through the vertex without intravenous contrast. COMPARISON:  06-10-18 CT head. FINDINGS: Brain: Multiple brain parenchymal hemorrhages within the paramedian frontal,  parietal, and occipital lobes as well as the left temporal lobes with hematocrit levels. Large area of hemorrhage within the left paramedian posterior frontal cortex has decompressed into the ventricular system in the interim. Brain parenchymal hemorrhage is otherwise stable. Stable moderate volume of diffuse subarachnoid hemorrhage over cerebral convexities and cerebellum. Mild interval increase in intraventricular hemorrhage likely redistributed from the left frontal parenchymal hematoma as above within new blood fluid level in the right lateral ventricle atria. Ventricle size is stable. No new acute intracranial hemorrhage, stroke, or focal mass effect. No herniation. Stable background of chronic microvascular ischemic changes and volume loss of the brain. Vascular: Calcific  atherosclerosis of the carotid siphons. Skull: Normal. Negative for fracture or focal lesion. Sinuses/Orbits: No acute finding. Other: None. IMPRESSION: 1. Interval decompression of left paramedian frontal lobe brain parenchymal hemorrhage into the ventricular system with increased blood products in both lateral ventricular atria. Stable ventricle size. 2. Otherwise stable brain parenchymal hemorrhage in paramedian frontal, parietal, and occipital lobes as well as the left temporal lobe. 3. Stable moderate volume of subarachnoid hemorrhage over convexities and cerebellum. 4. No new acute intracranial abnormality identified. Electronically Signed   By: Mitzi Hansen M.D.   On: 06/08/2018 00:54   Ct Head Wo Contrast  Result Date: 05/29/2018 CLINICAL DATA:  Stroke. Status post endovascular revascularization M1 occlusion and tPA. EXAM: CT HEAD WITHOUT CONTRAST TECHNIQUE: Contiguous axial images were obtained from the base of the skull through the vertex without intravenous contrast. COMPARISON:  CT HEAD June 07, 2018 at 1607 hours and procedural CT HEAD at June 07, 2018 1828 hours. FINDINGS: BRAIN: Multiple intraparenchymal hematomas including bilateral frontal parietal lobes, LEFT temporal lobe and likely within vermis. Regional mass effect of frontoparietal hematomas flattening the lateral ventricles. LEFT intraventricular blood products with hematocrit level. Suspected hematocrit levels within the hematomas, however there may be a component of contrast. Scattered extra-axial blood products predominately along the interhemispheric fissure and supra cerebellar cistern, cerebellar folia. Old LEFT occipital lobe infarct. No acute large vascular territory infarct. No hydrocephalus. Basal cisterns are patent. VASCULAR: Minimal calcific atherosclerosis of the carotid siphons. SKULL: No skull fracture. No significant scalp soft tissue swelling. SINUSES/ORBITS: Trace paranasal sinus mucosal  thickening. Mastoid air cells are well aerated.The included ocular globes and orbital contents are non-suspicious. OTHER: None. IMPRESSION: 1. Multiple new intraparenchymal hematomas with hematocrit levels consistent with acute and suspected active hemorrhage. 2. LEFT lateral intraventricular hemorrhage, possible parenchymal extension. 3. Scattered extra-axial blood products, confounded by postcontrast imaging. 4. Critical Value/emergent results were called by telephone at the time of interpretation on 05/26/2018 at 7:39 pm to Dr. Wilford Corner, Neurology, who verbally acknowledged these results. Electronically Signed   By: Awilda Metro M.D.   On: 05/20/2018 19:40   Ct Head Wo Contrast  Result Date: 05/28/2018 CLINICAL DATA:  Visual loss or uveitis/scleritis EXAM: CT HEAD WITHOUT CONTRAST TECHNIQUE: Contiguous axial images were obtained from the base of the skull through the vertex without intravenous contrast. COMPARISON:  Remote head CT 06/02/2005 FINDINGS: Brain: Progressive atrophy and chronic small vessel ischemia from 2006 exam. No intracranial hemorrhage, mass effect, or midline shift. No hydrocephalus. The basilar cisterns are patent. No evidence of territorial infarct or acute ischemia. No extra-axial or intracranial fluid collection. Vascular: No hyperdense vessel. Skull: No fracture or focal lesion. Sinuses/Orbits: Paranasal sinuses and mastoid air cells are clear. The visualized orbits are unremarkable. Other: None. IMPRESSION: 1.  No acute intracranial abnormality. 2. Atrophy with moderate to advanced chronic small vessel ischemia.  Electronically Signed   By: Narda Rutherford M.D.   On: 05/28/2018 03:53   Ct Angio Neck W Or Wo Contrast  Result Date: 05/29/2018 CLINICAL DATA:  Left-sided weakness and slurred speech. EXAM: CT ANGIOGRAPHY HEAD AND NECK TECHNIQUE: Multidetector CT imaging of the head and neck was performed using the standard protocol during bolus administration of intravenous contrast.  Multiplanar CT image reconstructions and MIPs were obtained to evaluate the vascular anatomy. Carotid stenosis measurements (when applicable) are obtained utilizing NASCET criteria, using the distal internal carotid diameter as the denominator. CONTRAST:  50mL ISOVUE-370 IOPAMIDOL (ISOVUE-370) INJECTION 76% COMPARISON:  Head CT same day FINDINGS: CTA NECK FINDINGS AORTIC ARCH: There is mild calcific atherosclerosis of the aortic arch. There is no aneurysm, dissection or hemodynamically significant stenosis of the visualized ascending aorta and aortic arch. Conventional 3 vessel aortic branching pattern. The visualized proximal subclavian arteries are widely patent. RIGHT CAROTID SYSTEM: --Common carotid artery: Widely patent origin without common carotid artery dissection or aneurysm. --Internal carotid artery: No dissection, occlusion or aneurysm. No hemodynamically significant stenosis. --External carotid artery: No acute abnormality. LEFT CAROTID SYSTEM: --Common carotid artery: Widely patent origin without common carotid artery dissection or aneurysm. --Internal carotid artery:No dissection, occlusion or aneurysm. No hemodynamically significant stenosis. --External carotid artery: No acute abnormality. VERTEBRAL ARTERIES: Left dominant configuration. Both origins are normal. No dissection, occlusion or flow-limiting stenosis to the vertebrobasilar confluence. SKELETON: There is no bony spinal canal stenosis. No lytic or blastic lesion. OTHER NECK: Normal pharynx, larynx and major salivary glands. No cervical lymphadenopathy. Unremarkable thyroid gland. UPPER CHEST: Biapical opacities most suggestive of fibrosis or scarring. A superimposed mass or consolidation with difficult to exclude. CTA HEAD FINDINGS ANTERIOR CIRCULATION: --Intracranial internal carotid arteries: Normal. --Anterior cerebral arteries: Normal. Both A1 segments are present. Patent anterior communicating artery. --Middle cerebral arteries:  Complete occlusion of the right middle cerebral artery M1 segment. No significant collateral flow within the right MCA territory. The left MCA is normal. --Posterior communicating arteries: Present on the right, absent on the left. POSTERIOR CIRCULATION: --Basilar artery: Normal. --Posterior cerebral arteries: Normal. --Superior cerebellar arteries: Normal. --Inferior cerebellar arteries: Normal anterior and posterior inferior cerebellar arteries. VENOUS SINUSES: As permitted by contrast timing, patent. ANATOMIC VARIANTS: None DELAYED PHASE: Not performed. Review of the MIP images confirms the above findings. IMPRESSION: 1. Emergent large vessel occlusion of the right middle cerebral artery M1 segment with minimal collateral flow demonstrated in the right MCA territory. 2. Biapical pulmonary opacities most consistent with fibrosis or scarring. A superimposed mass or consolidation would be difficult to exclude. If prior chest CTs exists, comparison would be helpful. 3. No arterial occlusion or hemodynamically significant stenosis of the major arteries of the neck. Critical Value/emergent results were called by telephone at the time of interpretation on 05/17/2018 at 4:27 pm to Dr. Caryl Pina, who verbally acknowledged these results. Electronically Signed   By: Deatra Robinson M.D.   On: 06/06/2018 16:43   Dg Chest Port 1 View  Result Date: 06/12/2018 CLINICAL DATA:  84 y/o  M; assess ET tube position. EXAM: PORTABLE CHEST 1 VIEW COMPARISON:  None. FINDINGS: Normal cardiac silhouette. Aortic atherosclerosis with calcification. Endotracheal tube tip projects 4.7 cm above carina. No focal consolidation. No pleural effusion or pneumothorax. No acute osseous abnormality is evident. IMPRESSION: No active disease.  Endotracheal tube tip 4.8 cm above the carina. Electronically Signed   By: Mitzi Hansen M.D.   On: 06/09/2018 22:37   Ct Head Code Stroke Wo Contrast  Result Date: 05-Jul-2018 CLINICAL DATA:   Code stroke.  Left-sided weakness and slurred speech EXAM: CT HEAD WITHOUT CONTRAST TECHNIQUE: Contiguous axial images were obtained from the base of the skull through the vertex without intravenous contrast. COMPARISON:  Head CT 05/28/2018 FINDINGS: Brain: There is no mass, hemorrhage or extra-axial collection. The size and configuration of the ventricles and extra-axial CSF spaces are normal. 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 No evidence of acute cortical infarct. There is hypoattenuation of the periventricular white matter, most commonly indicating chronic ischemic microangiopathy. Vascular: No abnormal hyperdensity of the major intracranial arteries or dural venous sinuses. No intracranial atherosclerosis. Skull: The visualized skull base, calvarium and extracranial soft tissues are normal. Sinuses/Orbits: No fluid levels or advanced mucosal thickening of the visualized paranasal sinuses. No mastoid or middle ear effusion. The orbits are normal. ASPECTS [facility] Stroke Program Early CT Score) - Ganglionic level infarction (caudate, lentiform nuclei, internal capsule, insula, M1-M3 cortex): 7 - Supraganglionic infarction (M4-M6 cortex): 3 Total score (0-10 with 10 being normal): 10 IMPRESSION: 1. No acute hemorrhage or mass lesion. 2. ASPECTS is 10. 3. 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 These results were called by telephone at the time of interpretation on July 05, 2018 at 4:09 pm to Dr. Caryl Pina , who verbally acknowledged these results. Electronically Signed   By: Deatra Robinson M.D.   On: 07/05/2018 16:24   MRI and MRA 1. Acute large right MCA infarct primarily involving cortex and basal ganglia. 2. Bilateral parenchymal hemorrhages, subarachnoid hemorrhage, and intraventricular hemorrhage, not significantly changed from today's earlier CT. 3. Moderate cerebral atrophy and chronic small vessel ischemic disease. 4. Negative head MRA. Continued patency of the  revascularized right MCA.  TTE - Technically difficult study. LVEF 65-70%, moderate LVH, normal   wall motion, mild aortic stenosis - mean gradient of 12 mmHg -   AVA around 1.7 cm2, trace to mild MR, normal LA size, dilated   IVC.  LE venous doppler Right: There is no evidence of deep vein thrombosis in the lower extremity. However, portions of this examination were limited- see technologist comments above. Left: There is no evidence of deep vein thrombosis in the lower extremity. However, portions of this examination were limited- see technologist comments above.   PHYSICAL EXAM  Temp:  [98 F (36.7 C)-100.1 F (37.8 C)] 100.1 F (37.8 C) (09/26 1500) Pulse Rate:  [53-87] 72 (09/26 1600) Resp:  [4-24] 15 (09/26 1600) BP: (109-161)/(56-84) 109/60 (09/26 1600) SpO2:  [93 %-100 %] 96 % (09/26 1600) FiO2 (%):  [30 %] 30 % (09/26 1431) Weight:  [80.3 kg] 80.3 kg (09/26 0500)  General - Well nourished, well developed, intubated not on sedation.  Ophthalmologic - fundi not visualized due to noncooperation.  Cardiovascular - Regular rate and rhythm.  Skin - b/l LEs foreleg b/l ulcers and wounds, with red/purple discoloration, on dressing.   Neuro - intubated not on sedation. Eyes closed, open on pain stimulation. PERRL, eye mid position, sluggish doll's eyes but able to move to the right slowly, not to the left, no tracking, no blinking to visual threat bilaterally. Corneal weak on the left but positive on the right. Positive gag and cough. Facial symmetry not able to test due to ET tube. On pain stimulation, withdraw in all extremities 2/5. DTR diminished and no babinski. Sensation, coordination and gait not tested.   ASSESSMENT/PLAN Mr. Justin [name] is a 84 y.o. male with history of DVT on Xarelto but stopped recently  after LE cellulitis, BPH on foley placed recently in [facility] admitted for aphasia, left facial droop, left weakness and neglect. tPA given.    Stroke:  right large  MCA infarct mainly at cortex and BG due to right M1 occlusion s/p tPA and IR with TICI3 reperfusion, embolic pattern, source unclear  Resultant intubated and left sided weakness and right gaze preference  CT head no acute finding  CTA head and neck - right M1 occlusion  DSA - right M1 occlusion s/p TICI3 reperfusion  MRI  right large MCA infarct mainly at cortex and BG   MRA unremarkable after IR  2D Echo EF 65-70%  LDL 61  HgbA1c 6.2  none for VTE prophylaxis due to b/l LE ulcers and ICH  No antithrombotic prior to admission, now on No antithrombotic   Ongoing aggressive stroke risk factor management  Therapy recommendations:  Pending   Disposition:  Pending   Updated wife and son over the phone, they would like to continue current management for the next 2-3 days, if no improvement, they will make decision about comfort care.  Code status DNR  ICH with IVH likely due to tPA s/p reversal  tPA reversed with TXA and cryo  CT post procedure showed b/l frontal ICH, left BG and temporal ICH with IVH and SAH  Repeat CT stable with left frontal/BG ICH decompressed to lateral ventricle  MRI confirmed ICH, IVH and SAH, no hydrocephalus. No significant CAA either.   Fibrinogen 370->446  Repeat CT in am  LE venous stasis with ulcers and cellulitis and hx of DVT  Wound care on board  Has hx of DVT was on Xarelto at home but it was discontinued on recent discharge on 06/02/18.   LE venous doppler no DVT this admission  On rocephin   Blood culture NGTD  Leukocytosis   Blood culture NGTD  UA WBC 6-10  Urine culture NGTD  WBC 14->12.1->12.7->11.3->11.0  On rocephin  Short run of V-tach  Captured on tele  Resolved on it own  Close monitoring  Hypertension/hypotension . Stable  BP goal < 160  Off cleviprex  Off neo  Labetalol PRN  Other Stroke Risk Factors  Advanced age  Other Active Problems  BPH - on foley catheter  Dysphagia - OG  tube  Hospital day # 3  This patient is critically ill due to right MCA infarct, ICH, SAH, IVH due to tPA, LE ulcers, hypotension and at significant risk of neurological worsening, death form cerebral edema, brain herniation, recurrent stroke, hematoma expansion, DVT, paradoxical emboli. This patient's care requires constant monitoring of vital signs, hemodynamics, respiratory and cardiac monitoring, review of multiple databases, neurological assessment, discussion with family, other specialists and medical decision making of high complexity. I spent 45 minutes of neurocritical care time in the care of this patient. I had long discussion with wife and son over the phone, updated pt current condition, treatment plan and potential prognosis. They expressed understanding and appreciation.   Marvel Plan, MD PhD Stroke Neurology 06/10/2018 5:27 PM    To contact Stroke Continuity provider, please refer to [email or web address]. After hours, contact General Neurology

## 2018-06-10 NOTE — Progress Notes (Signed)
NAME:  Justin [name], MRN:  [id number], DOB:  07-06-1928, LOS: 3 ADMISSION DATE:  2018/06/08, CONSULTATION DATE:  06/08/18 REFERRING MD:  Otelia Limes MD CHIEF COMPLAINT: Left sided flaccidity and aphasia  Brief History   84 yo male former smoker with aphasia, Rt gaze, Lt side weakness.  CT angio head/neck showed Rt MCA M1 occlusion.  Tx with tPA and then neuro IR did mechanical thrombectomy.  Developed ICH, SAH, IVH after procedure.  Intubated for airway protection.  He was previously on xarelto for hx of DVT, but recently stopped due to leg infections.   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, chronic ischemic microangiopathy CT angio head/neck 9/23 >> Rt MCA M1 occlusion, apical lung fibrosis CT head 9/24 >> decompression of Lt frontal lobe hemorrhage into ventricular system, stable SAH; frontal/parietal, occipital, lt temporal lobe hemorrhage MRI brain 9/24 >> large Acute Rt MCA infarct involving cortex and basal ganglia; b/l parenchymal, SH, and IVH; moderate atrophy and small vessel ischemic disease Doppler legs 9/24 >> no DVT Echo 9/25 >> EF 65 to 70%, mild AS, mild MR, mod LVH  Micro Data: Blood 9/24 >> Urine 9/24 >> negative  Antimicrobials:  Rocephin 9/23 >>   Subjective:  Not on sedation.  Objective   Blood pressure (!) 158/79, pulse 81, temperature 99.7 F (37.6 C), temperature source Axillary, resp. rate 18, height 5\' 10"  (1.778 m), weight 80.3 kg, SpO2 98 %.    Vent Mode: CPAP;PSV FiO2 (%):  [30 %] 30 % Set Rate:  [14 bmp] 14 bmp Vt Set:  [580 mL] 580 mL PEEP:  [5 cmH20] 5 cmH20 Pressure Support:  [5 cmH20-8 cmH20] 5 cmH20   Intake/Output  Summary (Last 24 hours) at 06/10/2018 1000 Last data filed at 06/10/2018 0900 Gross per 24 hour  Intake 3218.27 ml  Output 830 ml  Net 2388.27 ml   Filed Weights   08-Jun-2018 1617 06/10/18 0500  Weight: 78.1 kg 80.3 kg    Examination:  General - on vent Eyes - pupils reactive ENT - ETT in place Cardiac - regular rate/rhythm, no murmur Chest - scattered rhonchi Abdomen - soft, non tender, + bowel sounds GU - foley in place Extremities - 1+ edema Skin - sacral pressure wound, b/l leg ulcers Neuro - withdraws and grimaces with stimulation, not following commands  CXR 9/26 (reviewed by me) >> ASD Rt > Lt  Assessment & Plan:   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 ICH, SAH, and IVH after procedure. - poor neuro prognosis - DNR - neuro to discuss with family about goals of care and whether to transition to comfort measures  Compromised airway with acute hypoxic respiratory failure. Fibrotic changes on chest imaging. - he can wean on vent, but mental status precludes extubation trial - defer further chest imaging studies at this time given poor prognosis and probable plan to transition to comfort measures  Hypertension. - goal SBP < 160  Sacral wound, and lower leg ulcers. - present prior to admission - day 3 of rocephin - cleanse b/l legs with soap and water, pat dry, apply aquacel Ag to nonintact areas, wrap with kerlix and tape, change Tuesday/Friday  Anemia of critical illness. - defer additional lab testing for now  Chronic foley. - monitor urine outpt  Intermittent runs of VT. - DNR - no intervention at this time  Disposition / Summary of Today's Plan 06/10/18   He is now DNR.  Neuro prognosis seems poor.  Transition to comfort measures seems appropriate.  Neurology to discuss further with family.    Diet: tube feeds Pain/Anxiety/Delirium protocol: prn versed, fentanyl DVT prophylaxis:SCD if able with leg wounds GI prophylaxis: pepcid Mobility: bed  rest  Code Status: full  Family Communication: no family at bedside  Labs   CBC: Recent Labs  Lab 06/06/2018 1600 05/16/2018 1606 06/08/18 0435 06/09/18 0313 06/10/18 0631  WBC 12.1*  --  12.7* 11.3* 11.0*  NEUTROABS 5.8  --   --   --   --   HGB 11.7* 12.2* 9.6* 9.8* 10.0*  HCT 35.9* 36.0* 29.3* 30.0* 29.7*  MCV 96.0  --  95.4 96.8 94.6  PLT 495*  --  407* 387 397   Basic Metabolic Panel: Recent Labs  Lab 06/09/2018 1600 06/14/2018 1606 05/30/2018 2052 06/08/18 0435 06/09/18 0313 06/09/18 0901 06/09/18 1730 06/10/18 0631  NA 136 136  --  140 139  --   --  136  K 4.1 4.1  --  3.8 3.7  --   --  3.1*  CL 100 103  --  109 107  --   --  106  CO2 24  --   --  22 18*  --   --  23  GLUCOSE 101* 94  --  109* 91  --   --  156*  BUN 24* 27*  --  17 15  --   --  11  CREATININE 0.95 0.90  --  0.79 0.76  --   --  0.60*  CALCIUM 8.5*  --   --  7.9* 7.9*  --   --  8.0*  MG  --   --  1.8  --   --  1.6* 6.7* 1.7  PHOS  --   --  3.9  --   --  2.0* 1.7* 1.4*   GFR: Estimated Creatinine Clearance: 65.9 mL/min (A) (by C-G formula based on SCr of 0.6 mg/dL (L)). Recent Labs  Lab 05/17/2018 1600 06/08/18 0435 06/09/18 0313 06/10/18 0631  WBC 12.1* 12.7* 11.3* 11.0*   Liver Function Tests: Recent Labs  Lab 06/05/2018 1600  AST 24  ALT 19  ALKPHOS 70  BILITOT 0.9  PROT 5.9*  ALBUMIN 2.9*   No results for input(s): LIPASE, AMYLASE in the last 168 hours. No results for input(s): AMMONIA in the last 168 hours. ABG    Component Value Date/Time   PHART 7.421 06/08/2018 0452   PCO2ART 32.2 06/08/2018 0452   PO2ART 118.0 (H) 06/08/2018 0452   HCO3 20.9 06/08/2018 0452   TCO2 22 06/08/2018 0452   ACIDBASEDEF 3.0 (H) 06/08/2018 0452   O2SAT 99.0 06/08/2018 0452    Coagulation Profile: Recent Labs  Lab 06/10/2018 1600  INR 1.26   Cardiac Enzymes: No results for input(s): CKTOTAL, CKMB, CKMBINDEX, TROPONINI in the last 168 hours. HbA1C: Hgb A1c MFr Bld  Date/Time Value Ref Range  Status  06/08/2018 04:35 AM 6.2 (H) 4.8 - 5.6 % Final    Comment:    (NOTE) Pre diabetes:          5.7%-6.4% Diabetes:              >6.4% Glycemic control for   <  7.0% adults with diabetes    CBG: Recent Labs  Lab 06/09/18 1922 06/10/18 0021 06/10/18 0407 06/10/18 0734  GLUCAP 131* 129* 139* 127*     Coralyn Helling, MD [place] 06/10/2018, 10:00 AM

## 2018-06-11 ENCOUNTER — Inpatient Hospital Stay (HOSPITAL_COMMUNITY): Payer: Medicare Other

## 2018-06-11 DIAGNOSIS — I63411 Cerebral infarction due to embolism of right middle cerebral artery: Principal | ICD-10-CM

## 2018-06-11 DIAGNOSIS — J9601 Acute respiratory failure with hypoxia: Secondary | ICD-10-CM

## 2018-06-11 DIAGNOSIS — D72829 Elevated white blood cell count, unspecified: Secondary | ICD-10-CM

## 2018-06-11 LAB — CBC
HEMATOCRIT: 30.5 % — AB (ref 39.0–52.0)
HEMOGLOBIN: 10.2 g/dL — AB (ref 13.0–17.0)
MCH: 31.3 pg (ref 26.0–34.0)
MCHC: 33.4 g/dL (ref 30.0–36.0)
MCV: 93.6 fL (ref 78.0–100.0)
Platelets: 406 10*3/uL — ABNORMAL HIGH (ref 150–400)
RBC: 3.26 MIL/uL — AB (ref 4.22–5.81)
RDW: 14 % (ref 11.5–15.5)
WBC: 11.4 10*3/uL — ABNORMAL HIGH (ref 4.0–10.5)

## 2018-06-11 LAB — BASIC METABOLIC PANEL
Anion gap: 9 (ref 5–15)
BUN: 13 mg/dL (ref 8–23)
CO2: 24 mmol/L (ref 22–32)
Calcium: 8.1 mg/dL — ABNORMAL LOW (ref 8.9–10.3)
Chloride: 102 mmol/L (ref 98–111)
Creatinine, Ser: 0.59 mg/dL — ABNORMAL LOW (ref 0.61–1.24)
GFR calc non Af Amer: >60 mL/min (ref >60)
Glucose, Bld: 128 mg/dL — ABNORMAL HIGH (ref 70–99)
POTASSIUM: 3.5 mmol/L (ref 3.5–5.1)
Sodium: 135 mmol/L (ref 135–145)

## 2018-06-11 LAB — GLUCOSE, CAPILLARY
Glucose-Capillary: 128 mg/dL — ABNORMAL HIGH (ref 70–99)
Glucose-Capillary: 124 mg/dL — ABNORMAL HIGH (ref 70–99)
Glucose-Capillary: 136 mg/dL — ABNORMAL HIGH (ref 70–99)
Glucose-Capillary: 114 mg/dL — ABNORMAL HIGH (ref 70–99)
Glucose-Capillary: 132 mg/dL — ABNORMAL HIGH (ref 70–99)
Glucose-Capillary: 161 mg/dL — ABNORMAL HIGH (ref 70–99)

## 2018-06-11 MED ORDER — SENNOSIDES 8.8 MG/5ML PO SYRP
5.0000 mL | ORAL_SOLUTION | Freq: Two times a day (BID) | ORAL | Status: DC
  Administered 2018-06-11 – 2018-06-15 (×7): 5 mL via ORAL
  Filled 2018-06-11 (×8): qty 5

## 2018-06-11 MED ORDER — HEPARIN SODIUM (PORCINE) 5000 UNIT/ML IJ SOLN
5000.0000 [IU] | Freq: Three times a day (TID) | INTRAMUSCULAR | Status: DC
  Administered 2018-06-11 – 2018-06-15 (×13): 5000 [IU] via SUBCUTANEOUS
  Filled 2018-06-11 (×13): qty 1

## 2018-06-11 MED ORDER — BISACODYL 10 MG RE SUPP: 10.0000 mg | Freq: Every day | RECTAL | Status: DC | PRN

## 2018-06-11 NOTE — Progress Notes (Signed)
Transported patient from 4N30 to CT and back with no complications.  Suctioned patient before transport, vital signs remained stable during transport.  Patient resting comfortably in room now.

## 2018-06-11 NOTE — Progress Notes (Signed)
NAME:  Justin [name], MRN:  [id number], DOB:  1927/10/27, LOS: 4 ADMISSION DATE:  07-04-18, CONSULTATION DATE:  07/04/2018 REFERRING MD:  Otelia Limes MD CHIEF COMPLAINT: Left sided flaccidity and aphasia  Brief History   84 yo male former smoker with aphasia, Rt gaze, Lt side weakness.  CT angio head/neck showed Rt MCA M1 occlusion.  Tx with tPA and then neuro IR did mechanical thrombectomy.  Developed ICH, SAH, IVH after procedure.  Intubated for airway protection.  He was previously on xarelto for hx of DVT, but recently stopped due to leg infections.   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, chronic ischemic microangiopathy CT angio head/neck 9/23 >> Rt MCA M1 occlusion, apical lung fibrosis CT head 9/24 >> decompression of Lt frontal lobe hemorrhage into ventricular system, stable SAH; frontal/parietal, occipital, lt temporal lobe hemorrhage MRI brain 9/24 >> large Acute Rt MCA infarct involving cortex and basal ganglia; b/l parenchymal, SH, and IVH; moderate atrophy and small vessel ischemic disease Doppler legs 9/24 >> no DVT Echo 9/25 >> EF 65 to 70%, mild AS, mild MR, mod LVH CT head 9/27 >> decrease size ICH, SAH, IVH; evolution of Rt MCA infarct  Micro Data: Blood 9/24 >> Urine 9/24 >> negative  Antimicrobials:  Rocephin 9/23 >>   Subjective:  No change in mental status  Objective   Blood pressure 106/61, pulse 73, temperature 99.8 F (37.7 C), temperature source Axillary, resp. rate 18, height 5\' 10"  (1.778 m), weight 79.5 kg, SpO2 100 %.    Vent Mode: CPAP;PSV FiO2 (%):  [30 %] 30 % Set Rate:  [14 bmp] 14 bmp Vt Set:  [580 mL] 580 mL PEEP:  [5  cmH20] 5 cmH20 Pressure Support:  [5 cmH20] 5 cmH20 Plateau Pressure:  [13 cmH20-14 cmH20] 13 cmH20   Intake/Output Summary (Last 24 hours) at 06/11/2018 0918 Last data filed at 06/11/2018 0700 Gross per 24 hour  Intake 1678.48 ml  Output 1925 ml  Net -246.52 ml   Filed Weights   07/04/18 1617 06/10/18 0500 06/11/18 0500  Weight: 78.1 kg 80.3 kg 79.5 kg    Examination:  General - on vent Eyes - pupils reactive ENT - ETT in place Cardiac - regular rate/rhythm, no murmur Chest - no wheeze/rales Abdomen - soft, non tender, + bowel sounds Extremities - 1+ edema, Lt arm contracted Skin - sacral pressure wound, lower leg dressings clean Neuro - grimaces with stimulation, not following commands   Assessment & Plan:   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 ICH, SAH, and IVH after procedure. - poor prognosis, DNR - family deciding about whether to transition to comfort measures  Compromised airway with acute hypoxic respiratory failure. Fibrotic changes on chest imaging. - mental status barrier to extubation - would need trach for airway if family wishes to continue aggressive support  Hypertension. - SBP goal < 160  Sacral wound, and lower leg ulcers. - present prior to admission - day 4 of rocephin - cleanse b/l legs with soap and water, pat dry, apply aquacel Ag to nonintact areas, wrap with kerlix and tape, change Tuesday/Friday  Anemia of critical illness. - defer additional lab  testing for now  Chronic foley. - monitor urine outpt  Intermittent runs of VT. - DNR - no intervention at this time  Disposition / Summary of Today's Plan 06/11/18   DNR.  Family deciding whether to transition to comfort measures.  I will not order any additional imaging or lab tests.    Diet: tube feeds Pain/Anxiety/Delirium protocol: prn versed, fentanyl DVT prophylaxis:SCD GI prophylaxis: pepcid Mobility: bed rest  Code Status: DNR Family Communication: no family at  bedside  Labs   CBC: Recent Labs  Lab 05/25/2018 1600 06/13/2018 1606 06/08/18 0435 06/09/18 0313 06/10/18 0631  WBC 12.1*  --  12.7* 11.3* 11.0*  NEUTROABS 5.8  --   --   --   --   HGB 11.7* 12.2* 9.6* 9.8* 10.0*  HCT 35.9* 36.0* 29.3* 30.0* 29.7*  MCV 96.0  --  95.4 96.8 94.6  PLT 495*  --  407* 387 397   Basic Metabolic Panel: Recent Labs  Lab 05/28/2018 1600 06/08/2018 1606 05/22/2018 2052 06/08/18 0435 06/09/18 0313 06/09/18 0901 06/09/18 1730 06/10/18 0631  NA 136 136  --  140 139  --   --  136  K 4.1 4.1  --  3.8 3.7  --   --  3.1*  CL 100 103  --  109 107  --   --  106  CO2 24  --   --  22 18*  --   --  23  GLUCOSE 101* 94  --  109* 91  --   --  156*  BUN 24* 27*  --  17 15  --   --  11  CREATININE 0.95 0.90  --  0.79 0.76  --   --  0.60*  CALCIUM 8.5*  --   --  7.9* 7.9*  --   --  8.0*  MG  --   --  1.8  --   --  1.6* 6.7* 1.7  PHOS  --   --  3.9  --   --  2.0* 1.7* 1.4*   GFR: Estimated Creatinine Clearance: 65.9 mL/min (A) (by C-G formula based on SCr of 0.6 mg/dL (L)). Recent Labs  Lab 05/22/2018 1600 06/08/18 0435 06/09/18 0313 06/10/18 0631  WBC 12.1* 12.7* 11.3* 11.0*   Liver Function Tests: Recent Labs  Lab 05/28/2018 1600  AST 24  ALT 19  ALKPHOS 70  BILITOT 0.9  PROT 5.9*  ALBUMIN 2.9*   No results for input(s): LIPASE, AMYLASE in the last 168 hours. No results for input(s): AMMONIA in the last 168 hours. ABG    Component Value Date/Time   PHART 7.421 06/08/2018 0452   PCO2ART 32.2 06/08/2018 0452   PO2ART 118.0 (H) 06/08/2018 0452   HCO3 20.9 06/08/2018 0452   TCO2 22 06/08/2018 0452   ACIDBASEDEF 3.0 (H) 06/08/2018 0452   O2SAT 99.0 06/08/2018 0452    Coagulation Profile: Recent Labs  Lab 05/16/2018 1600  INR 1.26   Cardiac Enzymes: No results for input(s): CKTOTAL, CKMB, CKMBINDEX, TROPONINI in the last 168 hours. HbA1C: Hgb A1c MFr Bld  Date/Time Value Ref Range Status  06/08/2018 04:35 AM 6.2 (H) 4.8 - 5.6 % Final     Comment:    (NOTE) Pre diabetes:          5.7%-6.4% Diabetes:              >6.4% Glycemic control for   <7.0% adults with diabetes    CBG: Recent Labs  Lab 06/10/18 0734  06/10/18 1151 06/10/18 1531 06/11/18 0402 06/11/18 0751  GLUCAP 127* 124* 127* 128* 124*     Coralyn Helling, MD [facility] 06/11/2018, 9:18 AM

## 2018-06-11 NOTE — Progress Notes (Signed)
STROKE TEAM PROGRESS NOTE   SUBJECTIVE (INTERVAL HISTORY) No family is at the bedside. Pt still intubated not on sedation. Able to open eyes on voice, seems more awake. However, still not following commands, not tracking and not blinking to visual threat on the left. LUE decorticate posturing, RUE flaccid, BLEs withdraw to pain.    OBJECTIVE Temp:  [99.1 F (37.3 C)-101 F (38.3 C)] 99.2 F (37.3 C) (09/27 0919) Pulse Rate:  [41-86] 73 (09/27 0745) Cardiac Rhythm: Normal sinus rhythm (09/27 0400) Resp:  [4-24] 18 (09/27 0745) BP: (106-155)/(60-84) 106/61 (09/27 0700) SpO2:  [96 %-100 %] 100 % (09/27 0745) FiO2 (%):  [30 %] 30 % (09/27 0745) Weight:  [79.5 kg] 79.5 kg (09/27 0500)  Recent Labs  Lab 06/10/18 0734 06/10/18 1151 06/10/18 1531 06/11/18 0402 06/11/18 0751  GLUCAP 127* 124* 127* 128* 124*   Recent Labs  Lab 05/24/2018 1600 05/30/2018 1606 05/16/2018 2052 06/08/18 0435 06/09/18 0313 06/09/18 0901 06/09/18 1730 06/10/18 0631  NA 136 136  --  140 139  --   --  136  K 4.1 4.1  --  3.8 3.7  --   --  3.1*  CL 100 103  --  109 107  --   --  106  CO2 24  --   --  22 18*  --   --  23  GLUCOSE 101* 94  --  109* 91  --   --  156*  BUN 24* 27*  --  17 15  --   --  11  CREATININE 0.95 0.90  --  0.79 0.76  --   --  0.60*  CALCIUM 8.5*  --   --  7.9* 7.9*  --   --  8.0*  MG  --   --  1.8  --   --  1.6* 6.7* 1.7  PHOS  --   --  3.9  --   --  2.0* 1.7* 1.4*   Recent Labs  Lab 05/18/2018 1600  AST 24  ALT 19  ALKPHOS 70  BILITOT 0.9  PROT 5.9*  ALBUMIN 2.9*   Recent Labs  Lab 06/12/2018 1600 06/05/2018 1606 06/08/18 0435 06/09/18 0313 06/10/18 0631  WBC 12.1*  --  12.7* 11.3* 11.0*  NEUTROABS 5.8  --   --   --   --   HGB 11.7* 12.2* 9.6* 9.8* 10.0*  HCT 35.9* 36.0* 29.3* 30.0* 29.7*  MCV 96.0  --  95.4 96.8 94.6  PLT 495*  --  407* 387 397   No results for input(s): CKTOTAL, CKMB, CKMBINDEX, TROPONINI in the last 168 hours. No results for input(s): LABPROT, INR  in the last 72 hours. Recent Labs    06/08/18 1029  COLORURINE YELLOW  LABSPEC 1.030  PHURINE 5.0  GLUCOSEU NEGATIVE  HGBUR SMALL*  BILIRUBINUR NEGATIVE  KETONESUR 20*  PROTEINUR NEGATIVE  NITRITE NEGATIVE  LEUKOCYTESUR NEGATIVE       Component Value Date/Time   CHOL 103 06/08/2018 0435   TRIG 90 06/08/2018 0435   HDL 24 (L) 06/08/2018 0435   CHOLHDL 4.3 06/08/2018 0435   VLDL 18 06/08/2018 0435   LDLCALC 61 06/08/2018 0435   Lab Results  Component Value Date   HGBA1C 6.2 (H) 06/08/2018      Component Value Date/Time   LABOPIA NONE DETECTED 09/26/2012 0034   COCAINSCRNUR NONE DETECTED 09/26/2012 0034   LABBENZ NONE DETECTED 09/26/2012 0034   AMPHETMU NONE DETECTED 09/26/2012 0034   THCU NONE DETECTED  09/26/2012 0034   LABBARB NONE DETECTED 09/26/2012 0034    No results for input(s): ETH in the last 168 hours.  I have personally reviewed the radiological images below and agree with the radiology interpretations.  Ct Angio Head W Or Wo Contrast  Result Date: 06/09/2018 CLINICAL DATA:  Left-sided weakness and slurred speech. EXAM: CT ANGIOGRAPHY HEAD AND NECK TECHNIQUE: Multidetector CT imaging of the head and neck was performed using the standard protocol during bolus administration of intravenous contrast. Multiplanar CT image reconstructions and MIPs were obtained to evaluate the vascular anatomy. Carotid stenosis measurements (when applicable) are obtained utilizing NASCET criteria, using the distal internal carotid diameter as the denominator. CONTRAST:  50mL ISOVUE-370 IOPAMIDOL (ISOVUE-370) INJECTION 76% COMPARISON:  Head CT same day FINDINGS: CTA NECK FINDINGS AORTIC ARCH: There is mild calcific atherosclerosis of the aortic arch. There is no aneurysm, dissection or hemodynamically significant stenosis of the visualized ascending aorta and aortic arch. Conventional 3 vessel aortic branching pattern. The visualized proximal subclavian arteries are widely patent. RIGHT  CAROTID SYSTEM: --Common carotid artery: Widely patent origin without common carotid artery dissection or aneurysm. --Internal carotid artery: No dissection, occlusion or aneurysm. No hemodynamically significant stenosis. --External carotid artery: No acute abnormality. LEFT CAROTID SYSTEM: --Common carotid artery: Widely patent origin without common carotid artery dissection or aneurysm. --Internal carotid artery:No dissection, occlusion or aneurysm. No hemodynamically significant stenosis. --External carotid artery: No acute abnormality. VERTEBRAL ARTERIES: Left dominant configuration. Both origins are normal. No dissection, occlusion or flow-limiting stenosis to the vertebrobasilar confluence. SKELETON: There is no bony spinal canal stenosis. No lytic or blastic lesion. OTHER NECK: Normal pharynx, larynx and major salivary glands. No cervical lymphadenopathy. Unremarkable thyroid gland. UPPER CHEST: Biapical opacities most suggestive of fibrosis or scarring. A superimposed mass or consolidation with difficult to exclude. CTA HEAD FINDINGS ANTERIOR CIRCULATION: --Intracranial internal carotid arteries: Normal. --Anterior cerebral arteries: Normal. Both A1 segments are present. Patent anterior communicating artery. --Middle cerebral arteries: Complete occlusion of the right middle cerebral artery M1 segment. No significant collateral flow within the right MCA territory. The left MCA is normal. --Posterior communicating arteries: Present on the right, absent on the left. POSTERIOR CIRCULATION: --Basilar artery: Normal. --Posterior cerebral arteries: Normal. --Superior cerebellar arteries: Normal. --Inferior cerebellar arteries: Normal anterior and posterior inferior cerebellar arteries. VENOUS SINUSES: As permitted by contrast timing, patent. ANATOMIC VARIANTS: None DELAYED PHASE: Not performed. Review of the MIP images confirms the above findings. IMPRESSION: 1. Emergent large vessel occlusion of the right middle  cerebral artery M1 segment with minimal collateral flow demonstrated in the right MCA territory. 2. Biapical pulmonary opacities most consistent with fibrosis or scarring. A superimposed mass or consolidation would be difficult to exclude. If prior chest CTs exists, comparison would be helpful. 3. No arterial occlusion or hemodynamically significant stenosis of the major arteries of the neck. Critical Value/emergent results were called by telephone at the time of interpretation on 05/24/2018 at 4:27 pm to Dr. Caryl Pina, who verbally acknowledged these results. Electronically Signed   By: Deatra Robinson M.D.   On: 06/13/2018 16:43   Ct Head Wo Contrast  Result Date: 06/08/2018 CLINICAL DATA:  84 y/o  M; follow-up of intracranial hemorrhage. EXAM: CT HEAD WITHOUT CONTRAST TECHNIQUE: Contiguous axial images were obtained from the base of the skull through the vertex without intravenous contrast. COMPARISON:  05/20/2018 CT head. FINDINGS: Brain: Multiple brain parenchymal hemorrhages within the paramedian frontal, parietal, and occipital lobes as well as the left temporal lobes with hematocrit levels.  Large area of hemorrhage within the left paramedian posterior frontal cortex has decompressed into the ventricular system in the interim. Brain parenchymal hemorrhage is otherwise stable. Stable moderate volume of diffuse subarachnoid hemorrhage over cerebral convexities and cerebellum. Mild interval increase in intraventricular hemorrhage likely redistributed from the left frontal parenchymal hematoma as above within new blood fluid level in the right lateral ventricle atria. Ventricle size is stable. No new acute intracranial hemorrhage, stroke, or focal mass effect. No herniation. Stable background of chronic microvascular ischemic changes and volume loss of the brain. Vascular: Calcific atherosclerosis of the carotid siphons. Skull: Normal. Negative for fracture or focal lesion. Sinuses/Orbits: No acute finding.  Other: None. IMPRESSION: 1. Interval decompression of left paramedian frontal lobe brain parenchymal hemorrhage into the ventricular system with increased blood products in both lateral ventricular atria. Stable ventricle size. 2. Otherwise stable brain parenchymal hemorrhage in paramedian frontal, parietal, and occipital lobes as well as the left temporal lobe. 3. Stable moderate volume of subarachnoid hemorrhage over convexities and cerebellum. 4. No new acute intracranial abnormality identified. Electronically Signed   By: Mitzi Hansen M.D.   On: 06/08/2018 00:54   Ct Head Wo Contrast  Result Date: 2018-06-18 CLINICAL DATA:  Stroke. Status post endovascular revascularization M1 occlusion and tPA. EXAM: CT HEAD WITHOUT CONTRAST TECHNIQUE: Contiguous axial images were obtained from the base of the skull through the vertex without intravenous contrast. COMPARISON:  CT HEAD Jun 18, 2018 at 1607 hours and procedural CT HEAD at 06/18/2018 1828 hours. FINDINGS: BRAIN: Multiple intraparenchymal hematomas including bilateral frontal parietal lobes, LEFT temporal lobe and likely within vermis. Regional mass effect of frontoparietal hematomas flattening the lateral ventricles. LEFT intraventricular blood products with hematocrit level. Suspected hematocrit levels within the hematomas, however there may be a component of contrast. Scattered extra-axial blood products predominately along the interhemispheric fissure and supra cerebellar cistern, cerebellar folia. Old LEFT occipital lobe infarct. No acute large vascular territory infarct. No hydrocephalus. Basal cisterns are patent. VASCULAR: Minimal calcific atherosclerosis of the carotid siphons. SKULL: No skull fracture. No significant scalp soft tissue swelling. SINUSES/ORBITS: Trace paranasal sinus mucosal thickening. Mastoid air cells are well aerated.The included ocular globes and orbital contents are non-suspicious. OTHER: None.  IMPRESSION: 1. Multiple new intraparenchymal hematomas with hematocrit levels consistent with acute and suspected active hemorrhage. 2. LEFT lateral intraventricular hemorrhage, possible parenchymal extension. 3. Scattered extra-axial blood products, confounded by postcontrast imaging. 4. Critical Value/emergent results were called by telephone at the time of interpretation on 06/18/18 at 7:39 pm to Dr. Wilford Corner, Neurology, who verbally acknowledged these results. Electronically Signed   By: Awilda Metro M.D.   On: 06/18/2018 19:40   Ct Head Wo Contrast  Result Date: 05/28/2018 CLINICAL DATA:  Visual loss or uveitis/scleritis EXAM: CT HEAD WITHOUT CONTRAST TECHNIQUE: Contiguous axial images were obtained from the base of the skull through the vertex without intravenous contrast. COMPARISON:  Remote head CT 06/02/2005 FINDINGS: Brain: Progressive atrophy and chronic small vessel ischemia from 2006 exam. No intracranial hemorrhage, mass effect, or midline shift. No hydrocephalus. The basilar cisterns are patent. No evidence of territorial infarct or acute ischemia. No extra-axial or intracranial fluid collection. Vascular: No hyperdense vessel. Skull: No fracture or focal lesion. Sinuses/Orbits: Paranasal sinuses and mastoid air cells are clear. The visualized orbits are unremarkable. Other: None. IMPRESSION: 1.  No acute intracranial abnormality. 2. Atrophy with moderate to advanced chronic small vessel ischemia. Electronically Signed   By: Narda Rutherford M.D.   On: 05/28/2018 03:53  Ct Angio Neck W Or Wo Contrast  Result Date: 06-30-2018 CLINICAL DATA:  Left-sided weakness and slurred speech. EXAM: CT ANGIOGRAPHY HEAD AND NECK TECHNIQUE: Multidetector CT imaging of the head and neck was performed using the standard protocol during bolus administration of intravenous contrast. Multiplanar CT image reconstructions and MIPs were obtained to evaluate the vascular anatomy. Carotid stenosis measurements  (when applicable) are obtained utilizing NASCET criteria, using the distal internal carotid diameter as the denominator. CONTRAST:  50mL ISOVUE-370 IOPAMIDOL (ISOVUE-370) INJECTION 76% COMPARISON:  Head CT same day FINDINGS: CTA NECK FINDINGS AORTIC ARCH: There is mild calcific atherosclerosis of the aortic arch. There is no aneurysm, dissection or hemodynamically significant stenosis of the visualized ascending aorta and aortic arch. Conventional 3 vessel aortic branching pattern. The visualized proximal subclavian arteries are widely patent. RIGHT CAROTID SYSTEM: --Common carotid artery: Widely patent origin without common carotid artery dissection or aneurysm. --Internal carotid artery: No dissection, occlusion or aneurysm. No hemodynamically significant stenosis. --External carotid artery: No acute abnormality. LEFT CAROTID SYSTEM: --Common carotid artery: Widely patent origin without common carotid artery dissection or aneurysm. --Internal carotid artery:No dissection, occlusion or aneurysm. No hemodynamically significant stenosis. --External carotid artery: No acute abnormality. VERTEBRAL ARTERIES: Left dominant configuration. Both origins are normal. No dissection, occlusion or flow-limiting stenosis to the vertebrobasilar confluence. SKELETON: There is no bony spinal canal stenosis. No lytic or blastic lesion. OTHER NECK: Normal pharynx, larynx and major salivary glands. No cervical lymphadenopathy. Unremarkable thyroid gland. UPPER CHEST: Biapical opacities most suggestive of fibrosis or scarring. A superimposed mass or consolidation with difficult to exclude. CTA HEAD FINDINGS ANTERIOR CIRCULATION: --Intracranial internal carotid arteries: Normal. --Anterior cerebral arteries: Normal. Both A1 segments are present. Patent anterior communicating artery. --Middle cerebral arteries: Complete occlusion of the right middle cerebral artery M1 segment. No significant collateral flow within the right MCA territory.  The left MCA is normal. --Posterior communicating arteries: Present on the right, absent on the left. POSTERIOR CIRCULATION: --Basilar artery: Normal. --Posterior cerebral arteries: Normal. --Superior cerebellar arteries: Normal. --Inferior cerebellar arteries: Normal anterior and posterior inferior cerebellar arteries. VENOUS SINUSES: As permitted by contrast timing, patent. ANATOMIC VARIANTS: None DELAYED PHASE: Not performed. Review of the MIP images confirms the above findings. IMPRESSION: 1. Emergent large vessel occlusion of the right middle cerebral artery M1 segment with minimal collateral flow demonstrated in the right MCA territory. 2. Biapical pulmonary opacities most consistent with fibrosis or scarring. A superimposed mass or consolidation would be difficult to exclude. If prior chest CTs exists, comparison would be helpful. 3. No arterial occlusion or hemodynamically significant stenosis of the major arteries of the neck. Critical Value/emergent results were called by telephone at the time of interpretation on 30-Jun-2018 at 4:27 pm to Dr. Caryl Pina, who verbally acknowledged these results. Electronically Signed   By: Deatra Robinson M.D.   On: 06/30/2018 16:43   Dg Chest Port 1 View  Result Date: 30-Jun-2018 CLINICAL DATA:  84 y/o  M; assess ET tube position. EXAM: PORTABLE CHEST 1 VIEW COMPARISON:  None. FINDINGS: Normal cardiac silhouette. Aortic atherosclerosis with calcification. Endotracheal tube tip projects 4.7 cm above carina. No focal consolidation. No pleural effusion or pneumothorax. No acute osseous abnormality is evident. IMPRESSION: No active disease.  Endotracheal tube tip 4.8 cm above the carina. Electronically Signed   By: Mitzi Hansen M.D.   On: June 30, 2018 22:37   Ct Head Code Stroke Wo Contrast  Result Date: Jun 30, 2018 CLINICAL DATA:  Code stroke.  Left-sided weakness and slurred speech EXAM:  CT HEAD WITHOUT CONTRAST TECHNIQUE: Contiguous axial images were obtained  from the base of the skull through the vertex without intravenous contrast. COMPARISON:  Head CT 05/28/2018 FINDINGS: Brain: There is no mass, hemorrhage or extra-axial collection. The size and configuration of the ventricles and extra-axial CSF spaces are normal. 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 No evidence of acute cortical infarct. There is hypoattenuation of the periventricular white matter, most commonly indicating chronic ischemic microangiopathy. Vascular: No abnormal hyperdensity of the major intracranial arteries or dural venous sinuses. No intracranial atherosclerosis. Skull: The visualized skull base, calvarium and extracranial soft tissues are normal. Sinuses/Orbits: No fluid levels or advanced mucosal thickening of the visualized paranasal sinuses. No mastoid or middle ear effusion. The orbits are normal. ASPECTS [facility] Stroke Program Early CT Score) - Ganglionic level infarction (caudate, lentiform nuclei, internal capsule, insula, M1-M3 cortex): 7 - Supraganglionic infarction (M4-M6 cortex): 3 Total score (0-10 with 10 being normal): 10 IMPRESSION: 1. No acute hemorrhage or mass lesion. 2. ASPECTS is 10. 3. 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 These results were called by telephone at the time of interpretation on 06-29-18 at 4:09 pm to Dr. Caryl Pina , who verbally acknowledged these results. Electronically Signed   By: Deatra Robinson M.D.   On: 06/29/18 16:24   MRI and MRA 1. Acute large right MCA infarct primarily involving cortex and basal ganglia. 2. Bilateral parenchymal hemorrhages, subarachnoid hemorrhage, and intraventricular hemorrhage, not significantly changed from today's earlier CT. 3. Moderate cerebral atrophy and chronic small vessel ischemic disease. 4. Negative head MRA. Continued patency of the revascularized right MCA.  TTE - Technically difficult study. LVEF 65-70%, moderate LVH, normal   wall motion, mild aortic  stenosis - mean gradient of 12 mmHg -   AVA around 1.7 cm2, trace to mild MR, normal LA size, dilated   IVC.  LE venous doppler Right: There is no evidence of deep vein thrombosis in the lower extremity. However, portions of this examination were limited- see technologist comments above. Left: There is no evidence of deep vein thrombosis in the lower extremity. However, portions of this examination were limited- see technologist comments above.  Ct Head Wo Contrast Result Date: 06/11/2018 CLINICAL DATA:  Follow-up examination for intracranial hemorrhage and stroke. EXAM: CT HEAD WITHOUT CONTRAST TECHNIQUE: Contiguous axial images were obtained from the base of the skull through the vertex without intravenous contrast. COMPARISON:  Prior CT from 06/08/2018. FINDINGS: Brain: Multiple brain parenchymal hemorrhages involving the paramedian frontoparietal regions as well as the left temporal lobe again seen, stable in distribution. Overall, these hemorrhages are slightly decreased in size as compared to previous exam. Scattered areas of additional subarachnoid hemorrhage involving the cerebral and cerebellar hemispheres are also decreased from previous. Intraventricular hemorrhage with blood within the occipital horns of both lateral ventricles is decreased. Ventricular size is relatively stable. Superimposed large evolving right MCA territory infarct grossly stable in distribution as compared to previous MRI. No new intracranial hemorrhage or large vessel territory infarct. No extra-axial fluid collection. Basilar cisterns remain patent. No midline shift. Underlying atrophy with chronic small vessel ischemic disease again noted. Vascular: No hyperdense vessel. Scattered vascular calcifications noted within the carotid siphons. Skull: Scalp soft tissues and calvarium demonstrate no acute finding. Sinuses/Orbits: Globes and orbital soft tissues within normal limits. Paranasal sinuses and mastoid air cells are  largely clear. Nasogastric tube noted. Other: None. IMPRESSION: 1. Interval decrease in size of multifocal intraparenchymal cerebral hemorrhages, with decreased volume of  subarachnoid and intraventricular hemorrhage. Stable ventricular size. 2. Continued interval evolution of large right MCA territory infarct, stable in size and distribution relative to recent MRI. No evidence for hemorrhagic transformation or significant mass effect. 3. No other new acute intracranial abnormality. Electronically Signed   By: Rise Mu M.D.   On: 06/11/2018 04:46      PHYSICAL EXAM  Temp:  [99.1 F (37.3 C)-101 F (38.3 C)] 99.2 F (37.3 C) (09/27 0919) Pulse Rate:  [41-86] 73 (09/27 0745) Resp:  [4-24] 18 (09/27 0745) BP: (106-155)/(60-84) 106/61 (09/27 0700) SpO2:  [96 %-100 %] 100 % (09/27 0745) FiO2 (%):  [30 %] 30 % (09/27 0745) Weight:  [79.5 kg] 79.5 kg (09/27 0500)  General - Well nourished, well developed, intubated not on sedation.  Ophthalmologic - fundi not visualized due to noncooperation.  Cardiovascular - Regular rate and rhythm.  Skin - b/l LEs foreleg b/l ulcers and wounds, with red/purple discoloration, on dressing.   Neuro - intubated not on sedation. Eyes closed, but open on voice and remained open, however, not tracking and not following commands. PERRL, eye mid position, doll's eyes positive. Corneal weak on the left but positive on the right. Positive gag and cough. Facial symmetry not able to test due to ET tube. On pain stimulation, slight withdraw on UEs but 2+/5 LLE and 2/5 RLE. LUE increased tone with decorticate posturing, RUE flaccid with decreased tone. DTR diminished and no babinski. Sensation, coordination and gait not tested.   ASSESSMENT/PLAN Mr. KAYCEON OKI is a 84 y.o. male with history of DVT on Xarelto but stopped recently after LE cellulitis, BPH on foley placed recently in [facility] admitted for aphasia, left facial droop, left weakness and neglect. tPA  given.    Stroke:  right large MCA infarct mainly at cortex and BG due to right M1 occlusion s/p tPA and IR with TICI3 reperfusion, embolic pattern, source unclear  Resultant intubated and left sided weakness and right gaze preference  CT head no acute finding  CTA head and neck - right M1 occlusion  DSA - right M1 occlusion s/p TICI3 reperfusion  MRI  right large MCA infarct mainly at cortex and BG   MRA unremarkable after IR  2D Echo EF 65-70%  LDL 61  HgbA1c 6.2  Heparin subq for VTE prophylaxis due to b/l LE ulcers and ICH  No antithrombotic prior to admission, now on No antithrombotic   Ongoing aggressive stroke risk factor management  Therapy recommendations:  Pending   Disposition:  Pending   Updated wife over the phone, wife will try to come today but can not promise due to her own medical issue. Wife would like to continue current management for the next 2-3 days, if no improvement, she will like to talk to palliative care about comfort care as pt had clear statement that he would not want to be kept alive without QOL. I also discussed with son over the phone yesterday and updated the same.  Code status DNR  ICH with IVH likely due to tPA s/p reversal  tPA reversed with TXA and cryo  CT post procedure showed b/l frontal ICH, left BG and temporal ICH with IVH and SAH  Repeat CT stable with left frontal/BG ICH decompressed to lateral ventricle  MRI confirmed ICH, IVH and SAH, no hydrocephalus. No significant CAA either.   Fibrinogen 370->446  Repeat CT 06/11/18 stable hematoma, infarct and ventricle size, blood product less than before  LE venous stasis with ulcers  and cellulitis and hx of DVT  Wound care on board  Has hx of DVT was on Xarelto at home but it was discontinued on recent discharge on 06/02/18.   LE venous doppler no DVT this admission  On rocephin   Blood culture NGTD  Leukocytosis and fever  Spike fever last night - Tmax 101  Blood  culture NGTD  UA WBC 6-10  Urine culture NGTD  WBC 14->12.1->12.7->11.3->11.0->pending today  CXR 06/10/18 likely multifocal pneumonia  CCM on board  On rocephin - may consider switch to zosyn if needed  Short run of V-tach and transient cardiac arrest  Pt also had brief cardiac arrest during IR procedure and under anesthesia - reversed on its own  Captured on tele 06/10/18. Resolved on its own  Close monitoring  Hypertension/hypotension . Stable  BP goal < 160  Off cleviprex  Off neo  Labetalol PRN  Other Stroke Risk Factors  Advanced age  Other Active Problems  BPH - on foley catheter  Hospital day # 4  This patient is critically ill due to right MCA infarct, ICH, SAH, IVH due to tPA, LE ulcers, hypotension and at significant risk of neurological worsening, death form cerebral edema, brain herniation, recurrent stroke, hematoma expansion, DVT, paradoxical emboli. This patient's care requires constant monitoring of vital signs, hemodynamics, respiratory and cardiac monitoring, review of multiple databases, neurological assessment, discussion with family, other specialists and medical decision making of high complexity. I spent 40 minutes of neurocritical care time in the care of this patient. I had long discussion with wife over the phone, updated pt current condition, treatment plan and potential prognosis. She expressed understanding and appreciation.   Marvel Plan, MD PhD Stroke Neurology 06/11/2018 9:55 AM    To contact Stroke Continuity provider, please refer to [email or web address]. After hours, contact General Neurology

## 2018-06-11 NOTE — Plan of Care (Signed)
Pt currently on tube feeds and is tolerating.  Will continue to monitor.  Heloise Purpura RN

## 2018-06-12 ENCOUNTER — Encounter (HOSPITAL_COMMUNITY): Payer: Self-pay

## 2018-06-12 DIAGNOSIS — E440 Moderate protein-calorie malnutrition: Secondary | ICD-10-CM

## 2018-06-12 LAB — BASIC METABOLIC PANEL WITH GFR
Anion gap: 10 (ref 5–15)
BUN: 16 mg/dL (ref 8–23)
CO2: 25 mmol/L (ref 22–32)
Calcium: 8.3 mg/dL — ABNORMAL LOW (ref 8.9–10.3)
Chloride: 100 mmol/L (ref 98–111)
Creatinine, Ser: 0.57 mg/dL — ABNORMAL LOW (ref 0.61–1.24)
GFR calc Af Amer: >60 mL/min
GFR calc non Af Amer: >60 mL/min
Glucose, Bld: 126 mg/dL — ABNORMAL HIGH (ref 70–99)
Potassium: 3.8 mmol/L (ref 3.5–5.1)
Sodium: 135 mmol/L (ref 135–145)

## 2018-06-12 LAB — CBC
HCT: 32.1 % — ABNORMAL LOW (ref 39.0–52.0)
Hemoglobin: 10.6 g/dL — ABNORMAL LOW (ref 13.0–17.0)
MCH: 31.4 pg (ref 26.0–34.0)
MCHC: 33 g/dL (ref 30.0–36.0)
MCV: 95 fL (ref 78.0–100.0)
Platelets: 406 K/uL — ABNORMAL HIGH (ref 150–400)
RBC: 3.38 MIL/uL — ABNORMAL LOW (ref 4.22–5.81)
RDW: 14.3 % (ref 11.5–15.5)
WBC: 9.6 K/uL (ref 4.0–10.5)

## 2018-06-12 NOTE — Progress Notes (Signed)
STROKE TEAM PROGRESS NOTE   SUBJECTIVE (INTERVAL HISTORY) No family is at the bedside. Pt still intubated not on sedation. No Neurological changes.  OBJECTIVE Temp:  [98.6 F (37 C)-100.7 F (38.2 C)] 98.6 F (37 C) (09/28 1200) Pulse Rate:  [25-95] 95 (09/28 1213) Cardiac Rhythm: Normal sinus rhythm (09/28 0800) Resp:  [14-19] 18 (09/28 1213) BP: (107-149)/(56-86) 143/85 (09/28 0900) SpO2:  [93 %-100 %] 99 % (09/28 1213) FiO2 (%):  [30 %] 30 % (09/28 1213)  Recent Labs  Lab 06/10/18 2330 06/11/18 0402 06/11/18 0751 06/11/18 1207 06/11/18 1634  GLUCAP 132* 128* 124* 114* 136*   Recent Labs  Lab 06/02/2018 2052 06/08/18 0435 06/09/18 0313 06/09/18 0901 06/09/18 1730 06/10/18 0631 06/11/18 1232 06/12/18 0234  NA  --  140 139  --   --  136 135 135  K  --  3.8 3.7  --   --  3.1* 3.5 3.8  CL  --  109 107  --   --  106 102 100  CO2  --  22 18*  --   --  23 24 25   GLUCOSE  --  109* 91  --   --  156* 128* 126*  BUN  --  17 15  --   --  11 13 16   CREATININE  --  0.79 0.76  --   --  0.60* 0.59* 0.57*  CALCIUM  --  7.9* 7.9*  --   --  8.0* 8.1* 8.3*  MG 1.8  --   --  1.6* 6.7* 1.7  --   --   PHOS 3.9  --   --  2.0* 1.7* 1.4*  --   --    Recent Labs  Lab 06/13/2018 1600  AST 24  ALT 19  ALKPHOS 70  BILITOT 0.9  PROT 5.9*  ALBUMIN 2.9*   Recent Labs  Lab 06/06/2018 1600  06/08/18 0435 06/09/18 0313 06/10/18 0631 06/11/18 1232 06/12/18 0234  WBC 12.1*  --  12.7* 11.3* 11.0* 11.4* 9.6  NEUTROABS 5.8  --   --   --   --   --   --   HGB 11.7*   < > 9.6* 9.8* 10.0* 10.2* 10.6*  HCT 35.9*   < > 29.3* 30.0* 29.7* 30.5* 32.1*  MCV 96.0  --  95.4 96.8 94.6 93.6 95.0  PLT 495*  --  407* 387 397 406* 406*   < > = values in this interval not displayed.   No results for input(s): CKTOTAL, CKMB, CKMBINDEX, TROPONINI in the last 168 hours. No results for input(s): LABPROT, INR in the last 72 hours. No results for input(s): COLORURINE, LABSPEC, PHURINE, GLUCOSEU, HGBUR,  BILIRUBINUR, KETONESUR, PROTEINUR, UROBILINOGEN, NITRITE, LEUKOCYTESUR in the last 72 hours.  Invalid input(s): APPERANCEUR     Component Value Date/Time   CHOL 103 06/08/2018 0435   TRIG 90 06/08/2018 0435   HDL 24 (L) 06/08/2018 0435   CHOLHDL 4.3 06/08/2018 0435   VLDL 18 06/08/2018 0435   LDLCALC 61 06/08/2018 0435   Lab Results  Component Value Date   HGBA1C 6.2 (H) 06/08/2018      Component Value Date/Time   LABOPIA NONE DETECTED 09/26/2012 0034   COCAINSCRNUR NONE DETECTED 09/26/2012 0034   LABBENZ NONE DETECTED 09/26/2012 0034   AMPHETMU NONE DETECTED 09/26/2012 0034   THCU NONE DETECTED 09/26/2012 0034   LABBARB NONE DETECTED 09/26/2012 0034    No results for input(s): ETH in the last 168 hours.  I  have personally reviewed the radiological images below and agree with the radiology interpretations.  Ct Angio Head W Or Wo Contrast  Result Date: 06/14/2018 CLINICAL DATA:  Left-sided weakness and slurred speech. EXAM: CT ANGIOGRAPHY HEAD AND NECK TECHNIQUE: Multidetector CT imaging of the head and neck was performed using the standard protocol during bolus administration of intravenous contrast. Multiplanar CT image reconstructions and MIPs were obtained to evaluate the vascular anatomy. Carotid stenosis measurements (when applicable) are obtained utilizing NASCET criteria, using the distal internal carotid diameter as the denominator. CONTRAST:  50mL ISOVUE-370 IOPAMIDOL (ISOVUE-370) INJECTION 76% COMPARISON:  Head CT same day FINDINGS: CTA NECK FINDINGS AORTIC ARCH: There is mild calcific atherosclerosis of the aortic arch. There is no aneurysm, dissection or hemodynamically significant stenosis of the visualized ascending aorta and aortic arch. Conventional 3 vessel aortic branching pattern. The visualized proximal subclavian arteries are widely patent. RIGHT CAROTID SYSTEM: --Common carotid artery: Widely patent origin without common carotid artery dissection or aneurysm.  --Internal carotid artery: No dissection, occlusion or aneurysm. No hemodynamically significant stenosis. --External carotid artery: No acute abnormality. LEFT CAROTID SYSTEM: --Common carotid artery: Widely patent origin without common carotid artery dissection or aneurysm. --Internal carotid artery:No dissection, occlusion or aneurysm. No hemodynamically significant stenosis. --External carotid artery: No acute abnormality. VERTEBRAL ARTERIES: Left dominant configuration. Both origins are normal. No dissection, occlusion or flow-limiting stenosis to the vertebrobasilar confluence. SKELETON: There is no bony spinal canal stenosis. No lytic or blastic lesion. OTHER NECK: Normal pharynx, larynx and major salivary glands. No cervical lymphadenopathy. Unremarkable thyroid gland. UPPER CHEST: Biapical opacities most suggestive of fibrosis or scarring. A superimposed mass or consolidation with difficult to exclude. CTA HEAD FINDINGS ANTERIOR CIRCULATION: --Intracranial internal carotid arteries: Normal. --Anterior cerebral arteries: Normal. Both A1 segments are present. Patent anterior communicating artery. --Middle cerebral arteries: Complete occlusion of the right middle cerebral artery M1 segment. No significant collateral flow within the right MCA territory. The left MCA is normal. --Posterior communicating arteries: Present on the right, absent on the left. POSTERIOR CIRCULATION: --Basilar artery: Normal. --Posterior cerebral arteries: Normal. --Superior cerebellar arteries: Normal. --Inferior cerebellar arteries: Normal anterior and posterior inferior cerebellar arteries. VENOUS SINUSES: As permitted by contrast timing, patent. ANATOMIC VARIANTS: None DELAYED PHASE: Not performed. Review of the MIP images confirms the above findings. IMPRESSION: 1. Emergent large vessel occlusion of the right middle cerebral artery M1 segment with minimal collateral flow demonstrated in the right MCA territory. 2. Biapical  pulmonary opacities most consistent with fibrosis or scarring. A superimposed mass or consolidation would be difficult to exclude. If prior chest CTs exists, comparison would be helpful. 3. No arterial occlusion or hemodynamically significant stenosis of the major arteries of the neck. Critical Value/emergent results were called by telephone at the time of interpretation on 05/28/2018 at 4:27 pm to Dr. Caryl Pina, who verbally acknowledged these results. Electronically Signed   By: Deatra Robinson M.D.   On: 05/31/2018 16:43   Ct Head Wo Contrast  Result Date: 06/08/2018 CLINICAL DATA:  84 y/o  M; follow-up of intracranial hemorrhage. EXAM: CT HEAD WITHOUT CONTRAST TECHNIQUE: Contiguous axial images were obtained from the base of the skull through the vertex without intravenous contrast. COMPARISON:  05/27/2018 CT head. FINDINGS: Brain: Multiple brain parenchymal hemorrhages within the paramedian frontal, parietal, and occipital lobes as well as the left temporal lobes with hematocrit levels. Large area of hemorrhage within the left paramedian posterior frontal cortex has decompressed into the ventricular system in the interim. Brain parenchymal hemorrhage is  otherwise stable. Stable moderate volume of diffuse subarachnoid hemorrhage over cerebral convexities and cerebellum. Mild interval increase in intraventricular hemorrhage likely redistributed from the left frontal parenchymal hematoma as above within new blood fluid level in the right lateral ventricle atria. Ventricle size is stable. No new acute intracranial hemorrhage, stroke, or focal mass effect. No herniation. Stable background of chronic microvascular ischemic changes and volume loss of the brain. Vascular: Calcific atherosclerosis of the carotid siphons. Skull: Normal. Negative for fracture or focal lesion. Sinuses/Orbits: No acute finding. Other: None. IMPRESSION: 1. Interval decompression of left paramedian frontal lobe brain parenchymal  hemorrhage into the ventricular system with increased blood products in both lateral ventricular atria. Stable ventricle size. 2. Otherwise stable brain parenchymal hemorrhage in paramedian frontal, parietal, and occipital lobes as well as the left temporal lobe. 3. Stable moderate volume of subarachnoid hemorrhage over convexities and cerebellum. 4. No new acute intracranial abnormality identified. Electronically Signed   By: Mitzi Hansen M.D.   On: 06/08/2018 00:54   Ct Head Wo Contrast  Result Date: 06/08/2018 CLINICAL DATA:  Stroke. Status post endovascular revascularization M1 occlusion and tPA. EXAM: CT HEAD WITHOUT CONTRAST TECHNIQUE: Contiguous axial images were obtained from the base of the skull through the vertex without intravenous contrast. COMPARISON:  CT HEAD June 07, 2018 at 1607 hours and procedural CT HEAD at June 07, 2018 1828 hours. FINDINGS: BRAIN: Multiple intraparenchymal hematomas including bilateral frontal parietal lobes, LEFT temporal lobe and likely within vermis. Regional mass effect of frontoparietal hematomas flattening the lateral ventricles. LEFT intraventricular blood products with hematocrit level. Suspected hematocrit levels within the hematomas, however there may be a component of contrast. Scattered extra-axial blood products predominately along the interhemispheric fissure and supra cerebellar cistern, cerebellar folia. Old LEFT occipital lobe infarct. No acute large vascular territory infarct. No hydrocephalus. Basal cisterns are patent. VASCULAR: Minimal calcific atherosclerosis of the carotid siphons. SKULL: No skull fracture. No significant scalp soft tissue swelling. SINUSES/ORBITS: Trace paranasal sinus mucosal thickening. Mastoid air cells are well aerated.The included ocular globes and orbital contents are non-suspicious. OTHER: None. IMPRESSION: 1. Multiple new intraparenchymal hematomas with hematocrit levels consistent with acute and  suspected active hemorrhage. 2. LEFT lateral intraventricular hemorrhage, possible parenchymal extension. 3. Scattered extra-axial blood products, confounded by postcontrast imaging. 4. Critical Value/emergent results were called by telephone at the time of interpretation on 06/02/2018 at 7:39 pm to Dr. Wilford Corner, Neurology, who verbally acknowledged these results. Electronically Signed   By: Awilda Metro M.D.   On: 05/20/2018 19:40   Ct Head Wo Contrast  Result Date: 05/28/2018 CLINICAL DATA:  Visual loss or uveitis/scleritis EXAM: CT HEAD WITHOUT CONTRAST TECHNIQUE: Contiguous axial images were obtained from the base of the skull through the vertex without intravenous contrast. COMPARISON:  Remote head CT 06/02/2005 FINDINGS: Brain: Progressive atrophy and chronic small vessel ischemia from 2006 exam. No intracranial hemorrhage, mass effect, or midline shift. No hydrocephalus. The basilar cisterns are patent. No evidence of territorial infarct or acute ischemia. No extra-axial or intracranial fluid collection. Vascular: No hyperdense vessel. Skull: No fracture or focal lesion. Sinuses/Orbits: Paranasal sinuses and mastoid air cells are clear. The visualized orbits are unremarkable. Other: None. IMPRESSION: 1.  No acute intracranial abnormality. 2. Atrophy with moderate to advanced chronic small vessel ischemia. Electronically Signed   By: Narda Rutherford M.D.   On: 05/28/2018 03:53   Ct Angio Neck W Or Wo Contrast  Result Date: 06/14/2018 CLINICAL DATA:  Left-sided weakness and slurred speech. EXAM: CT ANGIOGRAPHY  HEAD AND NECK TECHNIQUE: Multidetector CT imaging of the head and neck was performed using the standard protocol during bolus administration of intravenous contrast. Multiplanar CT image reconstructions and MIPs were obtained to evaluate the vascular anatomy. Carotid stenosis measurements (when applicable) are obtained utilizing NASCET criteria, using the distal internal carotid diameter as the  denominator. CONTRAST:  50mL ISOVUE-370 IOPAMIDOL (ISOVUE-370) INJECTION 76% COMPARISON:  Head CT same day FINDINGS: CTA NECK FINDINGS AORTIC ARCH: There is mild calcific atherosclerosis of the aortic arch. There is no aneurysm, dissection or hemodynamically significant stenosis of the visualized ascending aorta and aortic arch. Conventional 3 vessel aortic branching pattern. The visualized proximal subclavian arteries are widely patent. RIGHT CAROTID SYSTEM: --Common carotid artery: Widely patent origin without common carotid artery dissection or aneurysm. --Internal carotid artery: No dissection, occlusion or aneurysm. No hemodynamically significant stenosis. --External carotid artery: No acute abnormality. LEFT CAROTID SYSTEM: --Common carotid artery: Widely patent origin without common carotid artery dissection or aneurysm. --Internal carotid artery:No dissection, occlusion or aneurysm. No hemodynamically significant stenosis. --External carotid artery: No acute abnormality. VERTEBRAL ARTERIES: Left dominant configuration. Both origins are normal. No dissection, occlusion or flow-limiting stenosis to the vertebrobasilar confluence. SKELETON: There is no bony spinal canal stenosis. No lytic or blastic lesion. OTHER NECK: Normal pharynx, larynx and major salivary glands. No cervical lymphadenopathy. Unremarkable thyroid gland. UPPER CHEST: Biapical opacities most suggestive of fibrosis or scarring. A superimposed mass or consolidation with difficult to exclude. CTA HEAD FINDINGS ANTERIOR CIRCULATION: --Intracranial internal carotid arteries: Normal. --Anterior cerebral arteries: Normal. Both A1 segments are present. Patent anterior communicating artery. --Middle cerebral arteries: Complete occlusion of the right middle cerebral artery M1 segment. No significant collateral flow within the right MCA territory. The left MCA is normal. --Posterior communicating arteries: Present on the right, absent on the left.  POSTERIOR CIRCULATION: --Basilar artery: Normal. --Posterior cerebral arteries: Normal. --Superior cerebellar arteries: Normal. --Inferior cerebellar arteries: Normal anterior and posterior inferior cerebellar arteries. VENOUS SINUSES: As permitted by contrast timing, patent. ANATOMIC VARIANTS: None DELAYED PHASE: Not performed. Review of the MIP images confirms the above findings. IMPRESSION: 1. Emergent large vessel occlusion of the right middle cerebral artery M1 segment with minimal collateral flow demonstrated in the right MCA territory. 2. Biapical pulmonary opacities most consistent with fibrosis or scarring. A superimposed mass or consolidation would be difficult to exclude. If prior chest CTs exists, comparison would be helpful. 3. No arterial occlusion or hemodynamically significant stenosis of the major arteries of the neck. Critical Value/emergent results were called by telephone at the time of interpretation on 06/08/2018 at 4:27 pm to Dr. Caryl Pina, who verbally acknowledged these results. Electronically Signed   By: Deatra Robinson M.D.   On: 06/01/2018 16:43   Dg Chest Port 1 View  Result Date: 06/12/2018 CLINICAL DATA:  84 y/o  M; assess ET tube position. EXAM: PORTABLE CHEST 1 VIEW COMPARISON:  None. FINDINGS: Normal cardiac silhouette. Aortic atherosclerosis with calcification. Endotracheal tube tip projects 4.7 cm above carina. No focal consolidation. No pleural effusion or pneumothorax. No acute osseous abnormality is evident. IMPRESSION: No active disease.  Endotracheal tube tip 4.8 cm above the carina. Electronically Signed   By: Mitzi Hansen M.D.   On: 05/29/2018 22:37   Ct Head Code Stroke Wo Contrast  Result Date: 05/31/2018 CLINICAL DATA:  Code stroke.  Left-sided weakness and slurred speech EXAM: CT HEAD WITHOUT CONTRAST TECHNIQUE: Contiguous axial images were obtained from the base of the skull through the vertex without intravenous contrast.  COMPARISON:  Head CT  05/28/2018 FINDINGS: Brain: There is no mass, hemorrhage or extra-axial collection. The size and configuration of the ventricles and extra-axial CSF spaces are normal. 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 No evidence of acute cortical infarct. There is hypoattenuation of the periventricular white matter, most commonly indicating chronic ischemic microangiopathy. Vascular: No abnormal hyperdensity of the major intracranial arteries or dural venous sinuses. No intracranial atherosclerosis. Skull: The visualized skull base, calvarium and extracranial soft tissues are normal. Sinuses/Orbits: No fluid levels or advanced mucosal thickening of the visualized paranasal sinuses. No mastoid or middle ear effusion. The orbits are normal. ASPECTS [facility] Stroke Program Early CT Score) - Ganglionic level infarction (caudate, lentiform nuclei, internal capsule, insula, M1-M3 cortex): 7 - Supraganglionic infarction (M4-M6 cortex): 3 Total score (0-10 with 10 being normal): 10 IMPRESSION: 1. No acute hemorrhage or mass lesion. 2. ASPECTS is 10. 3. 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 These results were called by telephone at the time of interpretation on 05/19/2018 at 4:09 pm to Dr. Caryl Pina , who verbally acknowledged these results. Electronically Signed   By: Deatra Robinson M.D.   On: 06/04/2018 16:24   MRI and MRA 1. Acute large right MCA infarct primarily involving cortex and basal ganglia. 2. Bilateral parenchymal hemorrhages, subarachnoid hemorrhage, and intraventricular hemorrhage, not significantly changed from today's earlier CT. 3. Moderate cerebral atrophy and chronic small vessel ischemic disease. 4. Negative head MRA. Continued patency of the revascularized right MCA.  TTE - Technically difficult study. LVEF 65-70%, moderate LVH, normal   wall motion, mild aortic stenosis - mean gradient of 12 mmHg -   AVA around 1.7 cm2, trace to mild MR, normal LA size,  dilated   IVC.  LE venous doppler Right: There is no evidence of deep vein thrombosis in the lower extremity. However, portions of this examination were limited- see technologist comments above. Left: There is no evidence of deep vein thrombosis in the lower extremity. However, portions of this examination were limited- see technologist comments above.  Ct Head Wo Contrast Result Date: 06/11/2018 CLINICAL DATA:  Follow-up examination for intracranial hemorrhage and stroke. EXAM: CT HEAD WITHOUT CONTRAST TECHNIQUE: Contiguous axial images were obtained from the base of the skull through the vertex without intravenous contrast. COMPARISON:  Prior CT from 06/08/2018. FINDINGS: Brain: Multiple brain parenchymal hemorrhages involving the paramedian frontoparietal regions as well as the left temporal lobe again seen, stable in distribution. Overall, these hemorrhages are slightly decreased in size as compared to previous exam. Scattered areas of additional subarachnoid hemorrhage involving the cerebral and cerebellar hemispheres are also decreased from previous. Intraventricular hemorrhage with blood within the occipital horns of both lateral ventricles is decreased. Ventricular size is relatively stable. Superimposed large evolving right MCA territory infarct grossly stable in distribution as compared to previous MRI. No new intracranial hemorrhage or large vessel territory infarct. No extra-axial fluid collection. Basilar cisterns remain patent. No midline shift. Underlying atrophy with chronic small vessel ischemic disease again noted. Vascular: No hyperdense vessel. Scattered vascular calcifications noted within the carotid siphons. Skull: Scalp soft tissues and calvarium demonstrate no acute finding. Sinuses/Orbits: Globes and orbital soft tissues within normal limits. Paranasal sinuses and mastoid air cells are largely clear. Nasogastric tube noted. Other: None. IMPRESSION: 1. Interval decrease in size of  multifocal intraparenchymal cerebral hemorrhages, with decreased volume of subarachnoid and intraventricular hemorrhage. Stable ventricular size. 2. Continued interval evolution of large right MCA territory infarct, stable in size and distribution  relative to recent MRI. No evidence for hemorrhagic transformation or significant mass effect. 3. No other new acute intracranial abnormality. Electronically Signed   By: Rise Mu M.D.   On: 06/11/2018 04:46      PHYSICAL EXAM  Temp:  [98.6 F (37 C)-100.7 F (38.2 C)] 98.6 F (37 C) (09/28 1200) Pulse Rate:  [25-95] 95 (09/28 1213) Resp:  [14-19] 18 (09/28 1213) BP: (107-149)/(56-86) 143/85 (09/28 0900) SpO2:  [93 %-100 %] 99 % (09/28 1213) FiO2 (%):  [30 %] 30 % (09/28 1213)  General - Well nourished, well developed, intubated not on sedation.  Ophthalmologic - fundi not visualized due to noncooperation.  Cardiovascular - Regular rate and rhythm.  Skin - b/l LEs foreleg b/l ulcers and wounds, with red/purple discoloration, on dressing.   Neuro - intubated not on sedation. Eyes closed, but open on voice and remained open, however, not tracking and not following commands. PERRL, eye mid position, doll's eyes positive. Corneal weak on the left but positive on the right. Positive gag and cough. Facial symmetry not able to test due to ET tube. On pain stimulation, slight withdraw on UEs but 2+/5 LLE and 2/5 RLE. LUE increased tone with decorticate posturing, RUE flaccid with decreased tone. DTR diminished and no babinski. Sensation, coordination and gait not tested.   ASSESSMENT/PLAN Justin [name] is a 84 y.o. male with history of DVT on Xarelto but stopped recently after LE cellulitis, BPH on foley placed recently in [facility] admitted for aphasia, left facial droop, left weakness and neglect. tPA given.    Stroke:  right large MCA infarct mainly at cortex and BG due to right M1 occlusion s/p tPA and IR with TICI3 reperfusion,  embolic pattern, source unclear  Resultant intubated and left sided weakness and right gaze preference  CT head no acute finding  CTA head and neck - right M1 occlusion  DSA - right M1 occlusion s/p TICI3 reperfusion  MRI  right large MCA infarct mainly at cortex and BG   MRA unremarkable after IR  2D Echo EF 65-70%  LDL 61  HgbA1c 6.2  Heparin subq for VTE prophylaxis due to b/l LE ulcers and ICH  No antithrombotic prior to admission, now on No antithrombotic   Ongoing aggressive stroke risk factor management  Therapy recommendations:  Pending   Disposition:  Pending   Updated wife over the phone, wife will try to come today but can not promise due to her own medical issue. Wife would like to continue current management for the next 2-3 days, if no improvement, she will like to talk to palliative care about comfort care as pt had clear statement that he would not want to be kept alive without QOL. I also discussed with son over the phone yesterday and updated the same.  Code status DNR  ICH with IVH likely due to tPA s/p reversal  tPA reversed with TXA and cryo  CT post procedure showed b/l frontal ICH, left BG and temporal ICH with IVH and SAH  Repeat CT stable with left frontal/BG ICH decompressed to lateral ventricle  MRI confirmed ICH, IVH and SAH, no hydrocephalus. No significant CAA either.   Fibrinogen 370->446  Repeat CT 06/11/18 stable hematoma, infarct and ventricle size, blood product less than before  LE venous stasis with ulcers and cellulitis and hx of DVT  Wound care on board  Has hx of DVT was on Xarelto at home but it was discontinued on recent discharge on 06/02/18.  LE venous doppler no DVT this admission  On rocephin   Blood culture NGTD  Leukocytosis and fever  Spike fever last night - Tmax 101  Blood culture NGTD  UA WBC 6-10  Urine culture NGTD  WBC 14->12.1->12.7->11.3->11.0->pending today  CXR 06/10/18 likely multifocal  pneumonia  CCM on board  On rocephin - may consider switch to zosyn if needed  Short run of V-tach and transient cardiac arrest  Pt also had brief cardiac arrest during IR procedure and under anesthesia - reversed on its own  Captured on tele 06/10/18. Resolved on its own  Close monitoring  Hypertension/hypotension . Stable  BP goal < 160  Off cleviprex  Off neo  Labetalol PRN  Other Stroke Risk Factors  Advanced age  Other Active Problems  BPH - on foley catheter  Hospital day # 5  This patient is critically ill due to right MCA infarct, ICH, SAH, IVH due to tPA, LE ulcers, hypotension and at significant risk of neurological worsening, death form cerebral edema, brain herniation, recurrent stroke, hematoma expansion, DVT, paradoxical emboli. This patient's care requires constant monitoring of vital signs, hemodynamics, respiratory and cardiac monitoring, review of multiple databases, neurological assessment, discussion with family, other specialists and medical decision making of high complexity. I spent 30 minutes of neurocritical care time in the care of this patient.  Discuss with Dr. Abran Cantor critical care medicine Delia Heady, MD Stroke Neurology 06/12/2018 1:18 PM    To contact Stroke Continuity provider, please refer to [email or web address]. After hours, contact General Neurology

## 2018-06-12 NOTE — Progress Notes (Signed)
NAME:  Justin [name], MRN:  [id number], DOB:  April 28, 1928, LOS: 5 ADMISSION DATE:  06/02/2018, CONSULTATION DATE:  05/28/2018 REFERRING MD:  Otelia Limes MD CHIEF COMPLAINT: Left sided flaccidity and aphasia  Brief History   84 yo male former smoker with aphasia, Rt gaze, Lt side weakness.  CT angio head/neck showed Rt MCA M1 occlusion.  Tx with tPA and then neuro IR did mechanical thrombectomy.  Developed ICH, SAH, IVH after procedure.  Intubated for airway protection.  He was previously on xarelto for hx of DVT, but recently stopped due to leg infections.   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, chronic ischemic microangiopathy CT angio head/neck 9/23 >> Rt MCA M1 occlusion, apical lung fibrosis CT head 9/24 >> decompression of Lt frontal lobe hemorrhage into ventricular system, stable SAH; frontal/parietal, occipital, lt temporal lobe hemorrhage MRI brain 9/24 >> large Acute Rt MCA infarct involving cortex and basal ganglia; b/l parenchymal, SH, and IVH; moderate atrophy and small vessel ischemic disease Doppler legs 9/24 >> no DVT Echo 9/25 >> EF 65 to 70%, mild AS, mild MR, mod LVH CT head 9/27 >> decrease size ICH, SAH, IVH; evolution of Rt MCA infarct  Micro Data: Blood 9/24 >> Urine 9/24 >> negative  Antimicrobials:  Rocephin 9/23 >>   Subjective:  No change in mental status  Objective   Blood pressure (!) 143/85, pulse 84, temperature 99.2 F (37.3 C), temperature source Axillary, resp. rate 17, height 5\' 10"  (1.778 m), weight 79.5 kg, SpO2 97 %.    Vent Mode: PSV;CPAP FiO2 (%):  [30 %] 30 % Set Rate:  [14 bmp] 14 bmp Vt Set:  [580 mL] 580 mL PEEP:   [5 cmH20] 5 cmH20 Pressure Support:  [5 cmH20] 5 cmH20 Plateau Pressure:  [12 cmH20-14 cmH20] 14 cmH20   Intake/Output Summary (Last 24 hours) at 06/12/2018 1025 Last data filed at 06/12/2018 0900 Gross per 24 hour  Intake 1572.39 ml  Output 2390 ml  Net -817.61 ml   Filed Weights   05/18/2018 1617 06/10/18 0500 06/11/18 0500  Weight: 78.1 kg 80.3 kg 79.5 kg    Examination:  General - on vent Eyes - pupils reactive ENT - ETT in place Cardiac - regular rate/rhythm, no murmur Chest - no wheeze/rales Abdomen - soft, non tender, + bowel sounds Extremities - 1+ edema, Lt arm contracted Skin - sacral pressure wound, lower leg dressings clean Neuro - grimaces with stimulation, not following commands   Assessment & Plan:   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 ICH, SAH, and IVH after procedure. - poor prognosis, DNR - family deciding about whether to transition to comfort measures  Compromised airway with acute hypoxic respiratory failure. Fibrotic changes on chest imaging. - mental status barrier to extubation - would need trach for airway if family wishes to continue aggressive support  Hypertension. - SBP goal < 160  Sacral wound, and lower leg ulcers. - present prior to admission - day 4 of rocephin - cleanse b/l legs with soap and water, pat dry, apply aquacel Ag to nonintact areas, wrap with kerlix and tape, change Tuesday/Friday  Anemia of critical illness. - defer additional  lab testing for now  Chronic foley. - monitor urine outpt  Intermittent runs of VT. - DNR - no intervention at this time  Disposition / Summary of Today's Plan 06/12/18   DNR.  Family deciding whether to transition to comfort measures.  I will not order any additional imaging or lab tests.    Diet: tube feeds Pain/Anxiety/Delirium protocol: prn versed, fentanyl DVT prophylaxis:SCD GI prophylaxis: pepcid Mobility: bed rest  Code Status: DNR Family Communication: no family at  bedside  Labs   CBC: Recent Labs  Lab 06-14-18 1600  06/08/18 0435 06/09/18 0313 06/10/18 0631 06/11/18 1232 06/12/18 0234  WBC 12.1*  --  12.7* 11.3* 11.0* 11.4* 9.6  NEUTROABS 5.8  --   --   --   --   --   --   HGB 11.7*   < > 9.6* 9.8* 10.0* 10.2* 10.6*  HCT 35.9*   < > 29.3* 30.0* 29.7* 30.5* 32.1*  MCV 96.0  --  95.4 96.8 94.6 93.6 95.0  PLT 495*  --  407* 387 397 406* 406*   < > = values in this interval not displayed.   Basic Metabolic Panel: Recent Labs  Lab 14-Jun-2018 2052 06/08/18 0435 06/09/18 0313 06/09/18 0901 06/09/18 1730 06/10/18 0631 06/11/18 1232 06/12/18 0234  NA  --  140 139  --   --  136 135 135  K  --  3.8 3.7  --   --  3.1* 3.5 3.8  CL  --  109 107  --   --  106 102 100  CO2  --  22 18*  --   --  23 24 25   GLUCOSE  --  109* 91  --   --  156* 128* 126*  BUN  --  17 15  --   --  11 13 16   CREATININE  --  0.79 0.76  --   --  0.60* 0.59* 0.57*  CALCIUM  --  7.9* 7.9*  --   --  8.0* 8.1* 8.3*  MG 1.8  --   --  1.6* 6.7* 1.7  --   --   PHOS 3.9  --   --  2.0* 1.7* 1.4*  --   --    GFR: Estimated Creatinine Clearance: 65.9 mL/min (A) (by C-G formula based on SCr of 0.57 mg/dL (L)). Recent Labs  Lab 06/09/18 0313 06/10/18 0631 06/11/18 1232 06/12/18 0234  WBC 11.3* 11.0* 11.4* 9.6   Liver Function Tests: Recent Labs  Lab Jun 14, 2018 1600  AST 24  ALT 19  ALKPHOS 70  BILITOT 0.9  PROT 5.9*  ALBUMIN 2.9*   No results for input(s): LIPASE, AMYLASE in the last 168 hours. No results for input(s): AMMONIA in the last 168 hours. ABG    Component Value Date/Time   PHART 7.421 06/08/2018 0452   PCO2ART 32.2 06/08/2018 0452   PO2ART 118.0 (H) 06/08/2018 0452   HCO3 20.9 06/08/2018 0452   TCO2 22 06/08/2018 0452   ACIDBASEDEF 3.0 (H) 06/08/2018 0452   O2SAT 99.0 06/08/2018 0452    Coagulation Profile: Recent Labs  Lab 06-14-2018 1600  INR 1.26   Cardiac Enzymes: No results for input(s): CKTOTAL, CKMB, CKMBINDEX, TROPONINI in the last 168  hours. HbA1C: Hgb A1c MFr Bld  Date/Time Value Ref Range Status  06/08/2018 04:35 AM 6.2 (H) 4.8 - 5.6 % Final    Comment:    (NOTE) Pre diabetes:          5.7%-6.4% Diabetes:              >  6.4% Glycemic control for   <7.0% adults with diabetes    CBG: Recent Labs  Lab 06/10/18 2330 06/11/18 0402 06/11/18 0751 06/11/18 1207 06/11/18 1634  GLUCAP 132* 128* 124* 114* 136*     Melodie Bouillon, MD [facility] 06/12/2018, 10:25 AM

## 2018-06-13 LAB — BASIC METABOLIC PANEL
Anion gap: 9 (ref 5–15)
BUN: 21 mg/dL (ref 8–23)
CHLORIDE: 99 mmol/L (ref 98–111)
CO2: 24 mmol/L (ref 22–32)
CREATININE: 0.51 mg/dL — AB (ref 0.61–1.24)
Calcium: 8.1 mg/dL — ABNORMAL LOW (ref 8.9–10.3)
GFR calc non Af Amer: >60 mL/min (ref >60)
Glucose, Bld: 133 mg/dL — ABNORMAL HIGH (ref 70–99)
Potassium: 4.1 mmol/L (ref 3.5–5.1)
Sodium: 132 mmol/L — ABNORMAL LOW (ref 135–145)

## 2018-06-13 LAB — CBC
HEMATOCRIT: 32.3 % — AB (ref 39.0–52.0)
HEMOGLOBIN: 10.9 g/dL — AB (ref 13.0–17.0)
MCH: 31.7 pg (ref 26.0–34.0)
MCHC: 33.7 g/dL (ref 30.0–36.0)
MCV: 93.9 fL (ref 78.0–100.0)
Platelets: 413 10*3/uL — ABNORMAL HIGH (ref 150–400)
RBC: 3.44 MIL/uL — ABNORMAL LOW (ref 4.22–5.81)
RDW: 14.3 % (ref 11.5–15.5)
WBC: 10.6 10*3/uL — ABNORMAL HIGH (ref 4.0–10.5)

## 2018-06-13 LAB — CULTURE, BLOOD (ROUTINE X 2)
Culture: NO GROWTH
Culture: NO GROWTH
SPECIAL REQUESTS: ADEQUATE
Special Requests: ADEQUATE

## 2018-06-13 NOTE — Progress Notes (Signed)
NAME:  Justin [name], MRN:  [id number], DOB:  12-27-1927, LOS: 6 ADMISSION DATE:  06/12/2018, CONSULTATION DATE:  05/30/2018 REFERRING MD:  Otelia Limes MD CHIEF COMPLAINT: Left sided flaccidity and aphasia  Brief History   84 yo male former smoker with aphasia, Rt gaze, Lt side weakness.  CT angio head/neck showed Rt MCA M1 occlusion.  Tx with tPA and then neuro IR did mechanical thrombectomy.  Developed ICH, SAH, IVH after procedure.  Intubated for airway protection.  He was previously on xarelto for hx of DVT, but recently stopped due to leg infections.   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, chronic ischemic microangiopathy CT angio head/neck 9/23 >> Rt MCA M1 occlusion, apical lung fibrosis CT head 9/24 >> decompression of Lt frontal lobe hemorrhage into ventricular system, stable SAH; frontal/parietal, occipital, lt temporal lobe hemorrhage MRI brain 9/24 >> large Acute Rt MCA infarct involving cortex and basal ganglia; b/l parenchymal, SH, and IVH; moderate atrophy and small vessel ischemic disease Doppler legs 9/24 >> no DVT Echo 9/25 >> EF 65 to 70%, mild AS, mild MR, mod LVH CT head 9/27 >> decrease size ICH, SAH, IVH; evolution of Rt MCA infarct  Micro Data: Blood 9/24 >> Urine 9/24 >> negative  Antimicrobials:  Rocephin 9/23 >>   Subjective:  No change in mental status  Objective   Blood pressure 132/63, pulse 78, temperature 99.6 F (37.6 C), temperature source Axillary, resp. rate 16, height 5\' 10"  (1.778 m), weight 79.5 kg, SpO2 99 %.    Vent Mode: PSV;CPAP FiO2 (%):  [30 %] 30 % Set Rate:  [14 bmp] 14 bmp Vt Set:  [580 mL] 580 mL PEEP:  [5  cmH20] 5 cmH20 Pressure Support:  [5 cmH20] 5 cmH20 Plateau Pressure:  [13 cmH20-16 cmH20] 14 cmH20   Intake/Output Summary (Last 24 hours) at 06/13/2018 1000 Last data filed at 06/13/2018 0900 Gross per 24 hour  Intake 1526.89 ml  Output 1825 ml  Net -298.11 ml   Filed Weights   06/02/2018 1617 06/10/18 0500 06/11/18 0500  Weight: 78.1 kg 80.3 kg 79.5 kg    Examination:  General - on vent Eyes - pupils reactive ENT - ETT in place Cardiac - regular rate/rhythm, no murmur Chest - no wheeze/rales Abdomen - soft, non tender, + bowel sounds Extremities - 1+ edema, Lt arm contracted Skin - sacral pressure wound, lower leg dressings clean Neuro - grimaces with stimulation, not following commands   Assessment & Plan:   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 ICH, SAH, and IVH after procedure. - poor prognosis, DNR - family deciding about whether to transition to comfort measures  Compromised airway with acute hypoxic respiratory failure. Fibrotic changes on chest imaging. - mental status barrier to extubation - would need trach for airway if family wishes to continue aggressive support  Hypertension. - SBP goal < 160  Sacral wound, and lower leg ulcers. - present prior to admission - day 4 of rocephin - cleanse b/l legs with soap and water, pat dry, apply aquacel Ag to nonintact areas, wrap with kerlix and tape, change Tuesday/Friday  Anemia of critical illness. - defer additional lab  testing for now  Chronic foley. - monitor urine outpt  Intermittent runs of VT. - DNR - no intervention at this time  Disposition / Summary of Today's Plan 06/13/18   Patient is DNR I spoke with the son who is [place] he is getting here tomorrow family is planning for withdrawal Tuesday there is no plans for escalation of care is DNR.    Diet: tube feeds Pain/Anxiety/Delirium protocol: prn versed, fentanyl DVT prophylaxis:SCD GI prophylaxis: pepcid Mobility: bed rest    Code Status: DNR Family Communication: no family at bedside  Labs   CBC: Recent Labs  Lab 2018-06-18 1600  06/09/18 0313 06/10/18 0631 06/11/18 1232 06/12/18 0234 06/13/18 0331  WBC 12.1*   < > 11.3* 11.0* 11.4* 9.6 10.6*  NEUTROABS 5.8  --   --   --   --   --   --   HGB 11.7*   < > 9.8* 10.0* 10.2* 10.6* 10.9*  HCT 35.9*   < > 30.0* 29.7* 30.5* 32.1* 32.3*  MCV 96.0   < > 96.8 94.6 93.6 95.0 93.9  PLT 495*   < > 387 397 406* 406* 413*   < > = values in this interval not displayed.   Basic Metabolic Panel: Recent Labs  Lab 2018/06/18 2052  06/09/18 0313 06/09/18 0901 06/09/18 1730 06/10/18 0631 06/11/18 1232 06/12/18 0234 06/13/18 0331  NA  --    < > 139  --   --  136 135 135 132*  K  --    < > 3.7  --   --  3.1* 3.5 3.8 4.1  CL  --    < > 107  --   --  106 102 100 99  CO2  --    < > 18*  --   --  23 24 25 24   GLUCOSE  --    < > 91  --   --  156* 128* 126* 133*  BUN  --    < > 15  --   --  11 13 16 21   CREATININE  --    < > 0.76  --   --  0.60* 0.59* 0.57* 0.51*  CALCIUM  --    < > 7.9*  --   --  8.0* 8.1* 8.3* 8.1*  MG 1.8  --   --  1.6* 6.7* 1.7  --   --   --   PHOS 3.9  --   --  2.0* 1.7* 1.4*  --   --   --    < > = values in this interval not displayed.   GFR: Estimated Creatinine Clearance: 65.9 mL/min (A) (by C-G formula based on SCr of 0.51 mg/dL (L)). Recent Labs  Lab 06/10/18 0631 06/11/18 1232 06/12/18 0234 06/13/18 0331  WBC 11.0* 11.4* 9.6 10.6*   Liver Function Tests: Recent Labs  Lab 06-18-2018 1600  AST 24  ALT 19  ALKPHOS 70  BILITOT 0.9  PROT 5.9*  ALBUMIN 2.9*   No results for input(s): LIPASE, AMYLASE in the last 168 hours. No results for input(s): AMMONIA in the last 168 hours. ABG    Component Value Date/Time   PHART 7.421 06/08/2018 0452   PCO2ART 32.2 06/08/2018 0452   PO2ART 118.0 (H) 06/08/2018 0452   HCO3 20.9 06/08/2018 0452   TCO2 22 06/08/2018 0452   ACIDBASEDEF 3.0 (H) 06/08/2018 0452   O2SAT 99.0 06/08/2018 0452     Coagulation Profile: Recent Labs  Lab  05/24/2018 1600  INR 1.26   Cardiac Enzymes: No results for input(s): CKTOTAL, CKMB, CKMBINDEX, TROPONINI in the last 168 hours. HbA1C: Hgb A1c MFr Bld  Date/Time Value Ref Range Status  06/08/2018 04:35 AM 6.2 (H) 4.8 - 5.6 % Final    Comment:    (NOTE) Pre diabetes:          5.7%-6.4% Diabetes:              >6.4% Glycemic control for   <7.0% adults with diabetes    CBG: Recent Labs  Lab 06/10/18 2330 06/11/18 0402 06/11/18 0751 06/11/18 1207 06/11/18 1634  GLUCAP 132* 128* 124* 114* 136*     Melodie Bouillon, MD [facility] 06/13/2018, 10:00 AM

## 2018-06-13 NOTE — Progress Notes (Signed)
STROKE TEAM PROGRESS NOTE   SUBJECTIVE (INTERVAL HISTORY) No family is at the bedside. Pt still intubated    No Neurological changes.  OBJECTIVE Temp:  [98.4 F (36.9 C)-99.9 F (37.7 C)] 99.6 F (37.6 C) (09/29 0800) Pulse Rate:  [41-96] 78 (09/29 0800) Cardiac Rhythm: Normal sinus rhythm (09/29 0800) Resp:  [13-19] 16 (09/29 0800) BP: (105-159)/(59-103) 132/63 (09/29 0800) SpO2:  [91 %-100 %] 99 % (09/29 0800) FiO2 (%):  [30 %] 30 % (09/29 0735)  Recent Labs  Lab 06/10/18 2330 06/11/18 0402 06/11/18 0751 06/11/18 1207 06/11/18 1634  GLUCAP 132* 128* 124* 114* 136*   Recent Labs  Lab 06/10/2018 2052  06/09/18 0313 06/09/18 0901 06/09/18 1730 06/10/18 0631 06/11/18 1232 06/12/18 0234 06/13/18 0331  NA  --    < > 139  --   --  136 135 135 132*  K  --    < > 3.7  --   --  3.1* 3.5 3.8 4.1  CL  --    < > 107  --   --  106 102 100 99  CO2  --    < > 18*  --   --  23 24 25 24   GLUCOSE  --    < > 91  --   --  156* 128* 126* 133*  BUN  --    < > 15  --   --  11 13 16 21   CREATININE  --    < > 0.76  --   --  0.60* 0.59* 0.57* 0.51*  CALCIUM  --    < > 7.9*  --   --  8.0* 8.1* 8.3* 8.1*  MG 1.8  --   --  1.6* 6.7* 1.7  --   --   --   PHOS 3.9  --   --  2.0* 1.7* 1.4*  --   --   --    < > = values in this interval not displayed.   Recent Labs  Lab 05/30/2018 1600  AST 24  ALT 19  ALKPHOS 70  BILITOT 0.9  PROT 5.9*  ALBUMIN 2.9*   Recent Labs  Lab 06/13/2018 1600  06/09/18 0313 06/10/18 0631 06/11/18 1232 06/12/18 0234 06/13/18 0331  WBC 12.1*   < > 11.3* 11.0* 11.4* 9.6 10.6*  NEUTROABS 5.8  --   --   --   --   --   --   HGB 11.7*   < > 9.8* 10.0* 10.2* 10.6* 10.9*  HCT 35.9*   < > 30.0* 29.7* 30.5* 32.1* 32.3*  MCV 96.0   < > 96.8 94.6 93.6 95.0 93.9  PLT 495*   < > 387 397 406* 406* 413*   < > = values in this interval not displayed.   No results for input(s): CKTOTAL, CKMB, CKMBINDEX, TROPONINI in the last 168 hours. No results for input(s): LABPROT, INR  in the last 72 hours. No results for input(s): COLORURINE, LABSPEC, PHURINE, GLUCOSEU, HGBUR, BILIRUBINUR, KETONESUR, PROTEINUR, UROBILINOGEN, NITRITE, LEUKOCYTESUR in the last 72 hours.  Invalid input(s): APPERANCEUR     Component Value Date/Time   CHOL 103 06/08/2018 0435   TRIG 90 06/08/2018 0435   HDL 24 (L) 06/08/2018 0435   CHOLHDL 4.3 06/08/2018 0435   VLDL 18 06/08/2018 0435   LDLCALC 61 06/08/2018 0435   Lab Results  Component Value Date   HGBA1C 6.2 (H) 06/08/2018      Component Value Date/Time   LABOPIA NONE  DETECTED 09/26/2012 0034   COCAINSCRNUR NONE DETECTED 09/26/2012 0034   LABBENZ NONE DETECTED 09/26/2012 0034   AMPHETMU NONE DETECTED 09/26/2012 0034   THCU NONE DETECTED 09/26/2012 0034   LABBARB NONE DETECTED 09/26/2012 0034    No results for input(s): ETH in the last 168 hours.  I have personally reviewed the radiological images below and agree with the radiology interpretations.  Ct Angio Head W Or Wo Contrast  Result Date: 05/28/2018 CLINICAL DATA:  Left-sided weakness and slurred speech. EXAM: CT ANGIOGRAPHY HEAD AND NECK TECHNIQUE: Multidetector CT imaging of the head and neck was performed using the standard protocol during bolus administration of intravenous contrast. Multiplanar CT image reconstructions and MIPs were obtained to evaluate the vascular anatomy. Carotid stenosis measurements (when applicable) are obtained utilizing NASCET criteria, using the distal internal carotid diameter as the denominator. CONTRAST:  50mL ISOVUE-370 IOPAMIDOL (ISOVUE-370) INJECTION 76% COMPARISON:  Head CT same day FINDINGS: CTA NECK FINDINGS AORTIC ARCH: There is mild calcific atherosclerosis of the aortic arch. There is no aneurysm, dissection or hemodynamically significant stenosis of the visualized ascending aorta and aortic arch. Conventional 3 vessel aortic branching pattern. The visualized proximal subclavian arteries are widely patent. RIGHT CAROTID SYSTEM: --Common  carotid artery: Widely patent origin without common carotid artery dissection or aneurysm. --Internal carotid artery: No dissection, occlusion or aneurysm. No hemodynamically significant stenosis. --External carotid artery: No acute abnormality. LEFT CAROTID SYSTEM: --Common carotid artery: Widely patent origin without common carotid artery dissection or aneurysm. --Internal carotid artery:No dissection, occlusion or aneurysm. No hemodynamically significant stenosis. --External carotid artery: No acute abnormality. VERTEBRAL ARTERIES: Left dominant configuration. Both origins are normal. No dissection, occlusion or flow-limiting stenosis to the vertebrobasilar confluence. SKELETON: There is no bony spinal canal stenosis. No lytic or blastic lesion. OTHER NECK: Normal pharynx, larynx and major salivary glands. No cervical lymphadenopathy. Unremarkable thyroid gland. UPPER CHEST: Biapical opacities most suggestive of fibrosis or scarring. A superimposed mass or consolidation with difficult to exclude. CTA HEAD FINDINGS ANTERIOR CIRCULATION: --Intracranial internal carotid arteries: Normal. --Anterior cerebral arteries: Normal. Both A1 segments are present. Patent anterior communicating artery. --Middle cerebral arteries: Complete occlusion of the right middle cerebral artery M1 segment. No significant collateral flow within the right MCA territory. The left MCA is normal. --Posterior communicating arteries: Present on the right, absent on the left. POSTERIOR CIRCULATION: --Basilar artery: Normal. --Posterior cerebral arteries: Normal. --Superior cerebellar arteries: Normal. --Inferior cerebellar arteries: Normal anterior and posterior inferior cerebellar arteries. VENOUS SINUSES: As permitted by contrast timing, patent. ANATOMIC VARIANTS: None DELAYED PHASE: Not performed. Review of the MIP images confirms the above findings. IMPRESSION: 1. Emergent large vessel occlusion of the right middle cerebral artery M1 segment  with minimal collateral flow demonstrated in the right MCA territory. 2. Biapical pulmonary opacities most consistent with fibrosis or scarring. A superimposed mass or consolidation would be difficult to exclude. If prior chest CTs exists, comparison would be helpful. 3. No arterial occlusion or hemodynamically significant stenosis of the major arteries of the neck. Critical Value/emergent results were called by telephone at the time of interpretation on 05/22/2018 at 4:27 pm to Dr. Caryl Pina, who verbally acknowledged these results. Electronically Signed   By: Deatra Robinson M.D.   On: 06/06/2018 16:43   Ct Head Wo Contrast  Result Date: 06/08/2018 CLINICAL DATA:  84 y/o  M; follow-up of intracranial hemorrhage. EXAM: CT HEAD WITHOUT CONTRAST TECHNIQUE: Contiguous axial images were obtained from the base of the skull through the vertex without intravenous contrast.  COMPARISON:  06/09/2018 CT head. FINDINGS: Brain: Multiple brain parenchymal hemorrhages within the paramedian frontal, parietal, and occipital lobes as well as the left temporal lobes with hematocrit levels. Large area of hemorrhage within the left paramedian posterior frontal cortex has decompressed into the ventricular system in the interim. Brain parenchymal hemorrhage is otherwise stable. Stable moderate volume of diffuse subarachnoid hemorrhage over cerebral convexities and cerebellum. Mild interval increase in intraventricular hemorrhage likely redistributed from the left frontal parenchymal hematoma as above within new blood fluid level in the right lateral ventricle atria. Ventricle size is stable. No new acute intracranial hemorrhage, stroke, or focal mass effect. No herniation. Stable background of chronic microvascular ischemic changes and volume loss of the brain. Vascular: Calcific atherosclerosis of the carotid siphons. Skull: Normal. Negative for fracture or focal lesion. Sinuses/Orbits: No acute finding. Other: None. IMPRESSION: 1.  Interval decompression of left paramedian frontal lobe brain parenchymal hemorrhage into the ventricular system with increased blood products in both lateral ventricular atria. Stable ventricle size. 2. Otherwise stable brain parenchymal hemorrhage in paramedian frontal, parietal, and occipital lobes as well as the left temporal lobe. 3. Stable moderate volume of subarachnoid hemorrhage over convexities and cerebellum. 4. No new acute intracranial abnormality identified. Electronically Signed   By: Mitzi Hansen M.D.   On: 06/08/2018 00:54   Ct Head Wo Contrast  Result Date: 06/01/2018 CLINICAL DATA:  Stroke. Status post endovascular revascularization M1 occlusion and tPA. EXAM: CT HEAD WITHOUT CONTRAST TECHNIQUE: Contiguous axial images were obtained from the base of the skull through the vertex without intravenous contrast. COMPARISON:  CT HEAD June 07, 2018 at 1607 hours and procedural CT HEAD at June 07, 2018 1828 hours. FINDINGS: BRAIN: Multiple intraparenchymal hematomas including bilateral frontal parietal lobes, LEFT temporal lobe and likely within vermis. Regional mass effect of frontoparietal hematomas flattening the lateral ventricles. LEFT intraventricular blood products with hematocrit level. Suspected hematocrit levels within the hematomas, however there may be a component of contrast. Scattered extra-axial blood products predominately along the interhemispheric fissure and supra cerebellar cistern, cerebellar folia. Old LEFT occipital lobe infarct. No acute large vascular territory infarct. No hydrocephalus. Basal cisterns are patent. VASCULAR: Minimal calcific atherosclerosis of the carotid siphons. SKULL: No skull fracture. No significant scalp soft tissue swelling. SINUSES/ORBITS: Trace paranasal sinus mucosal thickening. Mastoid air cells are well aerated.The included ocular globes and orbital contents are non-suspicious. OTHER: None. IMPRESSION: 1. Multiple new  intraparenchymal hematomas with hematocrit levels consistent with acute and suspected active hemorrhage. 2. LEFT lateral intraventricular hemorrhage, possible parenchymal extension. 3. Scattered extra-axial blood products, confounded by postcontrast imaging. 4. Critical Value/emergent results were called by telephone at the time of interpretation on 06/03/2018 at 7:39 pm to Dr. Wilford Corner, Neurology, who verbally acknowledged these results. Electronically Signed   By: Awilda Metro M.D.   On: 06/12/2018 19:40   Ct Head Wo Contrast  Result Date: 05/28/2018 CLINICAL DATA:  Visual loss or uveitis/scleritis EXAM: CT HEAD WITHOUT CONTRAST TECHNIQUE: Contiguous axial images were obtained from the base of the skull through the vertex without intravenous contrast. COMPARISON:  Remote head CT 06/02/2005 FINDINGS: Brain: Progressive atrophy and chronic small vessel ischemia from 2006 exam. No intracranial hemorrhage, mass effect, or midline shift. No hydrocephalus. The basilar cisterns are patent. No evidence of territorial infarct or acute ischemia. No extra-axial or intracranial fluid collection. Vascular: No hyperdense vessel. Skull: No fracture or focal lesion. Sinuses/Orbits: Paranasal sinuses and mastoid air cells are clear. The visualized orbits are unremarkable. Other: None. IMPRESSION: 1.  No acute intracranial abnormality. 2. Atrophy with moderate to advanced chronic small vessel ischemia. Electronically Signed   By: Narda Rutherford M.D.   On: 05/28/2018 03:53   Ct Angio Neck W Or Wo Contrast  Result Date: 05/23/2018 CLINICAL DATA:  Left-sided weakness and slurred speech. EXAM: CT ANGIOGRAPHY HEAD AND NECK TECHNIQUE: Multidetector CT imaging of the head and neck was performed using the standard protocol during bolus administration of intravenous contrast. Multiplanar CT image reconstructions and MIPs were obtained to evaluate the vascular anatomy. Carotid stenosis measurements (when applicable) are obtained  utilizing NASCET criteria, using the distal internal carotid diameter as the denominator. CONTRAST:  50mL ISOVUE-370 IOPAMIDOL (ISOVUE-370) INJECTION 76% COMPARISON:  Head CT same day FINDINGS: CTA NECK FINDINGS AORTIC ARCH: There is mild calcific atherosclerosis of the aortic arch. There is no aneurysm, dissection or hemodynamically significant stenosis of the visualized ascending aorta and aortic arch. Conventional 3 vessel aortic branching pattern. The visualized proximal subclavian arteries are widely patent. RIGHT CAROTID SYSTEM: --Common carotid artery: Widely patent origin without common carotid artery dissection or aneurysm. --Internal carotid artery: No dissection, occlusion or aneurysm. No hemodynamically significant stenosis. --External carotid artery: No acute abnormality. LEFT CAROTID SYSTEM: --Common carotid artery: Widely patent origin without common carotid artery dissection or aneurysm. --Internal carotid artery:No dissection, occlusion or aneurysm. No hemodynamically significant stenosis. --External carotid artery: No acute abnormality. VERTEBRAL ARTERIES: Left dominant configuration. Both origins are normal. No dissection, occlusion or flow-limiting stenosis to the vertebrobasilar confluence. SKELETON: There is no bony spinal canal stenosis. No lytic or blastic lesion. OTHER NECK: Normal pharynx, larynx and major salivary glands. No cervical lymphadenopathy. Unremarkable thyroid gland. UPPER CHEST: Biapical opacities most suggestive of fibrosis or scarring. A superimposed mass or consolidation with difficult to exclude. CTA HEAD FINDINGS ANTERIOR CIRCULATION: --Intracranial internal carotid arteries: Normal. --Anterior cerebral arteries: Normal. Both A1 segments are present. Patent anterior communicating artery. --Middle cerebral arteries: Complete occlusion of the right middle cerebral artery M1 segment. No significant collateral flow within the right MCA territory. The left MCA is normal.  --Posterior communicating arteries: Present on the right, absent on the left. POSTERIOR CIRCULATION: --Basilar artery: Normal. --Posterior cerebral arteries: Normal. --Superior cerebellar arteries: Normal. --Inferior cerebellar arteries: Normal anterior and posterior inferior cerebellar arteries. VENOUS SINUSES: As permitted by contrast timing, patent. ANATOMIC VARIANTS: None DELAYED PHASE: Not performed. Review of the MIP images confirms the above findings. IMPRESSION: 1. Emergent large vessel occlusion of the right middle cerebral artery M1 segment with minimal collateral flow demonstrated in the right MCA territory. 2. Biapical pulmonary opacities most consistent with fibrosis or scarring. A superimposed mass or consolidation would be difficult to exclude. If prior chest CTs exists, comparison would be helpful. 3. No arterial occlusion or hemodynamically significant stenosis of the major arteries of the neck. Critical Value/emergent results were called by telephone at the time of interpretation on 05/17/2018 at 4:27 pm to Dr. Caryl Pina, who verbally acknowledged these results. Electronically Signed   By: Deatra Robinson M.D.   On: 06/06/2018 16:43   Dg Chest Port 1 View  Result Date: 05/29/2018 CLINICAL DATA:  84 y/o  M; assess ET tube position. EXAM: PORTABLE CHEST 1 VIEW COMPARISON:  None. FINDINGS: Normal cardiac silhouette. Aortic atherosclerosis with calcification. Endotracheal tube tip projects 4.7 cm above carina. No focal consolidation. No pleural effusion or pneumothorax. No acute osseous abnormality is evident. IMPRESSION: No active disease.  Endotracheal tube tip 4.8 cm above the carina. Electronically Signed   By: Mitzi Hansen  M.D.   On: 05/18/2018 22:37   Ct Head Code Stroke Wo Contrast  Result Date: 06/01/2018 CLINICAL DATA:  Code stroke.  Left-sided weakness and slurred speech EXAM: CT HEAD WITHOUT CONTRAST TECHNIQUE: Contiguous axial images were obtained from the base of the  skull through the vertex without intravenous contrast. COMPARISON:  Head CT 05/28/2018 FINDINGS: Brain: There is no mass, hemorrhage or extra-axial collection. The size and configuration of the ventricles and extra-axial CSF spaces are normal. 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 No evidence of acute cortical infarct. There is hypoattenuation of the periventricular white matter, most commonly indicating chronic ischemic microangiopathy. Vascular: No abnormal hyperdensity of the major intracranial arteries or dural venous sinuses. No intracranial atherosclerosis. Skull: The visualized skull base, calvarium and extracranial soft tissues are normal. Sinuses/Orbits: No fluid levels or advanced mucosal thickening of the visualized paranasal sinuses. No mastoid or middle ear effusion. The orbits are normal. ASPECTS [facility] Stroke Program Early CT Score) - Ganglionic level infarction (caudate, lentiform nuclei, internal capsule, insula, M1-M3 cortex): 7 - Supraganglionic infarction (M4-M6 cortex): 3 Total score (0-10 with 10 being normal): 10 IMPRESSION: 1. No acute hemorrhage or mass lesion. 2. ASPECTS is 10. 3. 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 These results were called by telephone at the time of interpretation on 05/23/2018 at 4:09 pm to Dr. Caryl Pina , who verbally acknowledged these results. Electronically Signed   By: Deatra Robinson M.D.   On: 06/05/2018 16:24   MRI and MRA 1. Acute large right MCA infarct primarily involving cortex and basal ganglia. 2. Bilateral parenchymal hemorrhages, subarachnoid hemorrhage, and intraventricular hemorrhage, not significantly changed from today's earlier CT. 3. Moderate cerebral atrophy and chronic small vessel ischemic disease. 4. Negative head MRA. Continued patency of the revascularized right MCA.  TTE - Technically difficult study. LVEF 65-70%, moderate LVH, normal   wall motion, mild aortic stenosis - mean gradient  of 12 mmHg -   AVA around 1.7 cm2, trace to mild MR, normal LA size, dilated   IVC.  LE venous doppler Right: There is no evidence of deep vein thrombosis in the lower extremity. However, portions of this examination were limited- see technologist comments above. Left: There is no evidence of deep vein thrombosis in the lower extremity. However, portions of this examination were limited- see technologist comments above.  Ct Head Wo Contrast Result Date: 06/11/2018 CLINICAL DATA:  Follow-up examination for intracranial hemorrhage and stroke. EXAM: CT HEAD WITHOUT CONTRAST TECHNIQUE: Contiguous axial images were obtained from the base of the skull through the vertex without intravenous contrast. COMPARISON:  Prior CT from 06/08/2018. FINDINGS: Brain: Multiple brain parenchymal hemorrhages involving the paramedian frontoparietal regions as well as the left temporal lobe again seen, stable in distribution. Overall, these hemorrhages are slightly decreased in size as compared to previous exam. Scattered areas of additional subarachnoid hemorrhage involving the cerebral and cerebellar hemispheres are also decreased from previous. Intraventricular hemorrhage with blood within the occipital horns of both lateral ventricles is decreased. Ventricular size is relatively stable. Superimposed large evolving right MCA territory infarct grossly stable in distribution as compared to previous MRI. No new intracranial hemorrhage or large vessel territory infarct. No extra-axial fluid collection. Basilar cisterns remain patent. No midline shift. Underlying atrophy with chronic small vessel ischemic disease again noted. Vascular: No hyperdense vessel. Scattered vascular calcifications noted within the carotid siphons. Skull: Scalp soft tissues and calvarium demonstrate no acute finding. Sinuses/Orbits: Globes and orbital soft tissues within normal limits.  Paranasal sinuses and mastoid air cells are largely clear. Nasogastric  tube noted. Other: None. IMPRESSION: 1. Interval decrease in size of multifocal intraparenchymal cerebral hemorrhages, with decreased volume of subarachnoid and intraventricular hemorrhage. Stable ventricular size. 2. Continued interval evolution of large right MCA territory infarct, stable in size and distribution relative to recent MRI. No evidence for hemorrhagic transformation or significant mass effect. 3. No other new acute intracranial abnormality. Electronically Signed   By: Rise Mu M.D.   On: 06/11/2018 04:46      PHYSICAL EXAM  Temp:  [98.4 F (36.9 C)-99.9 F (37.7 C)] 99.6 F (37.6 C) (09/29 0800) Pulse Rate:  [41-96] 78 (09/29 0800) Resp:  [13-19] 16 (09/29 0800) BP: (105-159)/(59-103) 132/63 (09/29 0800) SpO2:  [91 %-100 %] 99 % (09/29 0800) FiO2 (%):  [30 %] 30 % (09/29 0735)  General - Well nourished, well developed, intubated not on sedation.  Ophthalmologic - fundi not visualized due to noncooperation.  Cardiovascular - Regular rate and rhythm.  Skin - b/l LEs foreleg b/l ulcers and wounds, with red/purple discoloration, on dressing.   Neuro - intubated not on sedation. Eyes closed, but open on voice and remained open, however, not tracking and not following commands. PERRL, eye mid position, doll's eyes positive. Corneal weak on the left but positive on the right. Positive gag and cough. Facial symmetry not able to test due to ET tube. On pain stimulation, slight withdraw on UEs but 2+/5 LLE and 2/5 RLE. LUE increased tone with decorticate posturing, RUE flaccid with decreased tone. DTR diminished and no babinski. Sensation, coordination and gait not tested.   ASSESSMENT/PLAN Justin [name] is a 84 y.o. male with history of DVT on Xarelto but stopped recently after LE cellulitis, BPH on foley placed recently in [facility] admitted for aphasia, left facial droop, left weakness and neglect. tPA given.    Stroke:  right large MCA infarct mainly at cortex and  BG due to right M1 occlusion s/p tPA and IR with TICI3 reperfusion, embolic pattern, source unclear  Resultant intubated and left sided weakness and right gaze preference  CT head no acute finding  CTA head and neck - right M1 occlusion  DSA - right M1 occlusion s/p TICI3 reperfusion  MRI  right large MCA infarct mainly at cortex and BG   MRA unremarkable after IR  2D Echo EF 65-70%  LDL 61  HgbA1c 6.2  Heparin subq for VTE prophylaxis due to b/l LE ulcers and ICH  No antithrombotic prior to admission, now on No antithrombotic   Ongoing aggressive stroke risk factor management  Therapy recommendations:  Pending   Disposition:  Pending   Per Dr Roda Shutters - Updated wife over the phone, wife will try to come in but can not promise due to her own medical issue. Wife would like to continue current management for the next 2-3 days, if no improvement, she would like to talk to palliative care about comfort care as pt had clear statement that he would not want to be kept alive without QOL. I also discussed with son over the phone and updated the same.  Code status DNR  ICH with IVH likely due to tPA s/p reversal  tPA reversed with TXA and cryo  CT post procedure showed b/l frontal ICH, left BG and temporal ICH with IVH and SAH  Repeat CT stable with left frontal/BG ICH decompressed to lateral ventricle  MRI confirmed ICH, IVH and SAH, no hydrocephalus. No significant CAA either.  Fibrinogen 370->446  Repeat CT 06/11/18 stable hematoma, infarct and ventricle size, blood product less than before  LE venous stasis with ulcers and cellulitis and hx of DVT  Wound care on board  Has hx of DVT was on Xarelto at home but it was discontinued on recent discharge on 06/02/18.   LE venous doppler no DVT this admission  On rocephin   Blood culture NGTD  Leukocytosis and fever  Spike fever last night - Tmax 101  Blood culture NGTD  UA WBC 6-10  Urine culture NGTD  WBC  14->12.1->12.7->11.3->11.0->pending today  CXR 06/10/18 likely multifocal pneumonia  CCM on board  On rocephin (started 06/10/2018) - may consider switch to zosyn if needed  Short run of V-tach and transient cardiac arrest  Pt also had brief cardiac arrest during IR procedure and under anesthesia - reversed on its own  Captured on tele 06/10/18. Resolved on its own  Close monitoring  Hypertension/hypotension . Stable  BP goal < 160  Off cleviprex  Off neo  Labetalol PRN  Other Stroke Risk Factors  Advanced age  Other Active Problems  BPH - on foley catheter  Hospital day # 6  This patient is critically ill due to right MCA infarct, ICH, SAH, IVH due to tPA, LE ulcers, hypotension and at significant risk of neurological worsening, death form cerebral edema, brain herniation, recurrent stroke, hematoma expansion, DVT, paradoxical emboli. This patient's care requires constant monitoring of vital signs, hemodynamics, respiratory and cardiac monitoring, review of multiple databases, neurological assessment, discussion with family, other specialists and medical decision making of high complexity. I spent 30 minutes of neurocritical care time in the care of this patient.  Discussed with Dr. Abran Cantor critical care medicine patient's RN  Delia Heady, MD Medical Director [facility] Pager: [phone] 06/13/2018 12:41 PM  To contact Stroke Continuity provider, please refer to [email or web address]. After hours, contact General Neurology

## 2018-06-14 LAB — BASIC METABOLIC PANEL
ANION GAP: 8 (ref 5–15)
BUN: 19 mg/dL (ref 8–23)
CHLORIDE: 98 mmol/L (ref 98–111)
CO2: 25 mmol/L (ref 22–32)
Calcium: 8.4 mg/dL — ABNORMAL LOW (ref 8.9–10.3)
Creatinine, Ser: 0.55 mg/dL — ABNORMAL LOW (ref 0.61–1.24)
GFR calc Af Amer: >60 mL/min (ref >60)
GLUCOSE: 115 mg/dL — AB (ref 70–99)
POTASSIUM: 4.4 mmol/L (ref 3.5–5.1)
Sodium: 131 mmol/L — ABNORMAL LOW (ref 135–145)

## 2018-06-14 LAB — CBC
HEMATOCRIT: 33.2 % — AB (ref 39.0–52.0)
HEMOGLOBIN: 11.2 g/dL — AB (ref 13.0–17.0)
MCH: 31.6 pg (ref 26.0–34.0)
MCHC: 33.7 g/dL (ref 30.0–36.0)
MCV: 93.8 fL (ref 78.0–100.0)
PLATELETS: 423 10*3/uL — AB (ref 150–400)
RBC: 3.54 MIL/uL — AB (ref 4.22–5.81)
RDW: 14 % (ref 11.5–15.5)
WBC: 10.5 10*3/uL (ref 4.0–10.5)

## 2018-06-14 NOTE — Progress Notes (Signed)
STROKE TEAM PROGRESS NOTE   SUBJECTIVE (INTERVAL HISTORY) No family is at the bedside. Pt still intubated    No Neurological changes.  OBJECTIVE Temp:  [98.4 F (36.9 C)-101.2 F (38.4 C)] 99.1 F (37.3 C) (09/30 0800) Pulse Rate:  [71-91] 84 (09/30 1112) Cardiac Rhythm: Normal sinus rhythm (09/30 0800) Resp:  [15-24] 19 (09/30 1112) BP: (116-147)/(60-88) 142/65 (09/30 1112) SpO2:  [96 %-100 %] 100 % (09/30 1112) FiO2 (%):  [30 %] 30 % (09/30 1112)  Recent Labs  Lab 06/10/18 2330 06/11/18 0402 06/11/18 0751 06/11/18 1207 06/11/18 1634  GLUCAP 132* 128* 124* 114* 136*   Recent Labs  Lab 05/24/2018 2052  06/09/18 0901 06/09/18 1730 06/10/18 0631 06/11/18 1232 06/12/18 0234 06/13/18 0331 06/14/18 0303  NA  --    < >  --   --  136 135 135 132* 131*  K  --    < >  --   --  3.1* 3.5 3.8 4.1 4.4  CL  --    < >  --   --  106 102 100 99 98  CO2  --    < >  --   --  23 24 25 24 25   GLUCOSE  --    < >  --   --  156* 128* 126* 133* 115*  BUN  --    < >  --   --  11 13 16 21 19   CREATININE  --    < >  --   --  0.60* 0.59* 0.57* 0.51* 0.55*  CALCIUM  --    < >  --   --  8.0* 8.1* 8.3* 8.1* 8.4*  MG 1.8  --  1.6* 6.7* 1.7  --   --   --   --   PHOS 3.9  --  2.0* 1.7* 1.4*  --   --   --   --    < > = values in this interval not displayed.   Recent Labs  Lab 06/09/2018 1600  AST 24  ALT 19  ALKPHOS 70  BILITOT 0.9  PROT 5.9*  ALBUMIN 2.9*   Recent Labs  Lab 05/27/2018 1600  06/10/18 0631 06/11/18 1232 06/12/18 0234 06/13/18 0331 06/14/18 0303  WBC 12.1*   < > 11.0* 11.4* 9.6 10.6* 10.5  NEUTROABS 5.8  --   --   --   --   --   --   HGB 11.7*   < > 10.0* 10.2* 10.6* 10.9* 11.2*  HCT 35.9*   < > 29.7* 30.5* 32.1* 32.3* 33.2*  MCV 96.0   < > 94.6 93.6 95.0 93.9 93.8  PLT 495*   < > 397 406* 406* 413* 423*   < > = values in this interval not displayed.   No results for input(s): CKTOTAL, CKMB, CKMBINDEX, TROPONINI in the last 168 hours. No results for input(s): LABPROT,  INR in the last 72 hours. No results for input(s): COLORURINE, LABSPEC, PHURINE, GLUCOSEU, HGBUR, BILIRUBINUR, KETONESUR, PROTEINUR, UROBILINOGEN, NITRITE, LEUKOCYTESUR in the last 72 hours.  Invalid input(s): APPERANCEUR     Component Value Date/Time   CHOL 103 06/08/2018 0435   TRIG 90 06/08/2018 0435   HDL 24 (L) 06/08/2018 0435   CHOLHDL 4.3 06/08/2018 0435   VLDL 18 06/08/2018 0435   LDLCALC 61 06/08/2018 0435   Lab Results  Component Value Date   HGBA1C 6.2 (H) 06/08/2018      Component Value Date/Time   LABOPIA NONE  DETECTED 09/26/2012 0034   COCAINSCRNUR NONE DETECTED 09/26/2012 0034   LABBENZ NONE DETECTED 09/26/2012 0034   AMPHETMU NONE DETECTED 09/26/2012 0034   THCU NONE DETECTED 09/26/2012 0034   LABBARB NONE DETECTED 09/26/2012 0034    No results for input(s): ETH in the last 168 hours.   IMAGING  Ct Angio Head W Or Wo Contrast Ct Angio Neck W Or Wo Contrast 07/07/18 IMPRESSION:  1. Emergent large vessel occlusion of the right middle cerebral artery M1 segment with minimal collateral flow demonstrated in the right MCA territory.  2. Biapical pulmonary opacities most consistent with fibrosis or scarring. A superimposed mass or consolidation would be difficult to exclude. If prior chest CTs exists, comparison would be helpful.  3. No arterial occlusion or hemodynamically significant stenosis of the major arteries of the neck.    Ct Head Wo Contrast 05/28/2018 IMPRESSION:  1.  No acute intracranial abnormality.  2. Atrophy with moderate to advanced chronic small vessel ischemia.    Ct Head Wo Contrast 07-Jul-2018 IMPRESSION:  1. Multiple new intraparenchymal hematomas with hematocrit levels consistent with acute and suspected active hemorrhage.  2. LEFT lateral intraventricular hemorrhage, possible parenchymal extension.  3. Scattered extra-axial blood products, confounded by postcontrast imaging.   Ct Head Code Stroke Wo  Contrast 07-Jul-2018 IMPRESSION:  1. No acute hemorrhage or mass lesion.  2. ASPECTS is 10.  3. 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    Ct Head Wo Contrast 06/08/2018 IMPRESSION:  1. Interval decompression of left paramedian frontal lobe brain parenchymal hemorrhage into the ventricular system with increased blood products in both lateral ventricular atria. Stable ventricle size.  2. Otherwise stable brain parenchymal hemorrhage in paramedian frontal, parietal, and occipital lobes as well as the left temporal lobe.  3. Stable moderate volume of subarachnoid hemorrhage over convexities and cerebellum.  4. No new acute intracranial abnormality identified.    Ct Head Wo Contrast 06/11/2018 IMPRESSION:  1. Interval decrease in size of multifocal intraparenchymal cerebral hemorrhages, with decreased volume of subarachnoid and intraventricular hemorrhage. Stable ventricular size.  2. Continued interval evolution of large right MCA territory infarct, stable in size and distribution relative to recent MRI. No evidence for hemorrhagic transformation or significant mass effect.  3. No other new acute intracranial abnormality.    MRI and MRA 06/08/2018 1. Acute large right MCA infarct primarily involving cortex and basal ganglia. 2. Bilateral parenchymal hemorrhages, subarachnoid hemorrhage, and intraventricular hemorrhage, not significantly changed from today's earlier CT. 3. Moderate cerebral atrophy and chronic small vessel ischemic disease. 4. Negative head MRA. Continued patency of the revascularized right MCA.   Dg Chest Port 1 View 07-07-18 IMPRESSION:  No active disease.  Endotracheal tube tip 4.8 cm above the carina.    TTE - Technically difficult study. LVEF 65-70%, moderate LVH, normal   wall motion, mild aortic stenosis - mean gradient of 12 mmHg -   AVA around 1.7 cm2, trace to mild MR, normal LA size, dilated   IVC.   LE  venous doppler Right: There is no evidence of deep vein thrombosis in the lower extremity. However, portions of this examination were limited- see technologist comments above. Left: There is no evidence of deep vein thrombosis in the lower extremity. However, portions of this examination were limited- see technologist comments above.     PHYSICAL EXAM  Temp:  [98.4 F (36.9 C)-101.2 F (38.4 C)] 99.1 F (37.3 C) (09/30 0800) Pulse Rate:  [71-91] 84 (09/30 1112)  Resp:  [15-24] 19 (09/30 1112) BP: (116-147)/(60-88) 142/65 (09/30 1112) SpO2:  [96 %-100 %] 100 % (09/30 1112) FiO2 (%):  [30 %] 30 % (09/30 1112)  General - Well nourished, well developed, intubated not on sedation.  Ophthalmologic - fundi not visualized due to noncooperation.  Cardiovascular - Regular rate and rhythm.  Skin - b/l LEs foreleg b/l ulcers and wounds, with red/purple discoloration, on dressing.   Neuro - intubated not on sedation. Eyes closed, but open on voice and remained open, however, not tracking and not following commands. PERRL, eye mid position, doll's eyes positive. Corneal weak on the left but positive on the right. Positive gag and cough. Facial symmetry not able to test due to ET tube. On pain stimulation, slight withdraw on UEs but 2+/5 LLE and 2/5 RLE. LUE increased tone with decorticate posturing, RUE flaccid with decreased tone. DTR diminished and no babinski. Sensation, coordination and gait not tested.   ASSESSMENT/PLAN Justin [name] is a 84 y.o. male with history of DVT on Xarelto but stopped recently after LE cellulitis, BPH on foley placed recently in [facility] admitted for aphasia, left facial droop, left weakness and neglect.  tPA given and mechanical thrombectomy  Stroke:  right large MCA infarct mainly at cortex and BG due to right M1 occlusion s/p tPA and IR with TICI3 reperfusion, embolic pattern, source unclear  Resultant intubated and left sided weakness and right gaze  preference  CT head no acute finding  CTA head and neck - right M1 occlusion  DSA - right M1 occlusion s/p TICI3 reperfusion  MRI  right large MCA infarct mainly at cortex and BG   MRA unremarkable after IR  2D Echo EF 65-70%  LDL 61  HgbA1c 6.2  Heparin subq for VTE prophylaxis due to b/l LE ulcers and ICH  No antithrombotic prior to admission, now on No antithrombotic   Ongoing aggressive stroke risk factor management  Therapy recommendations:  Pending   Disposition:  Pending   Per Dr Roda Shutters - Updated wife over the phone, wife will try to come in but can not promise due to her own medical issue. Wife would like to continue current management for the next 2-3 days, if no improvement, she would like to talk to palliative care about comfort care as pt had clear statement that he would not want to be kept alive without QOL. I also discussed with son over the phone and updated the same.  Code status DNR  ICH with IVH likely due to tPA s/p reversal  tPA reversed with TXA and cryo  CT post procedure showed b/l frontal ICH, left BG and temporal ICH with IVH and SAH  Repeat CT stable with left frontal/BG ICH decompressed to lateral ventricle  MRI confirmed ICH, IVH and SAH, no hydrocephalus. No significant CAA either.   Fibrinogen 370->446  Repeat CT 06/11/18 stable hematoma, infarct and ventricle size, blood product less than before  LE venous stasis with ulcers and cellulitis and hx of DVT  Wound care on board  Has hx of DVT was on Xarelto at home but it was discontinued on recent discharge on 06/02/18.   LE venous doppler no DVT this admission  Off rocephin   Blood culture NGTD  Leukocytosis and fever  Spike fever last night - Tmax 101  Blood culture NGTD  UA WBC 6-10  Urine culture NGTD  WBC 14->12.1->12.7->11.3->11.0->pending today  CXR 06/10/18 likely multifocal pneumonia  CCM on board  Now off rocephin (  started 06/25/18)   Short run of V-tach and  transient cardiac arrest  Pt also had brief cardiac arrest during IR procedure and under anesthesia - reversed on its own  Captured on tele 06/10/18. Resolved on its own  Close monitoring  Hypertension/hypotension . Stable  BP goal < 160  Off cleviprex  Off neo  Labetalol PRN  Other Stroke Risk Factors  Advanced age  Other Active Problems  BPH - on foley catheter  Bmet and CBC today as above  Hospital day # 7  This patient is critically ill due to right MCA infarct, ICH, SAH, IVH due to tPA, LE ulcers, hypotension and at significant risk of neurological worsening, death form cerebral edema, brain herniation, recurrent stroke, hematoma expansion, DVT, paradoxical emboli. This patient's care requires constant monitoring of vital signs, hemodynamics, respiratory and cardiac monitoring, review of multiple databases, neurological assessment, discussion with family, other specialists and medical decision making of high complexity. I spent 30 minutes of neurocritical care time in the care of this patient.  Discussed with Dr. Delton Coombes critical care medicine patient's RN.Patient's son is arriving this evening and likely plans to withdraw ventilatory support and make patient comfort care only. Delia Heady, MD Medical Director [facility] Pager: [phone] 06/14/2018 3:00 PM   To contact Stroke Continuity provider, please refer to [email or web address]. After hours, contact General Neurology

## 2018-06-14 NOTE — Progress Notes (Signed)
NAME:  Justin [name], MRN:  [id number], DOB:  November 08, 1927, LOS: 7 ADMISSION DATE:  Jul 01, 2018, CONSULTATION DATE:  07-01-2018 REFERRING MD:  Otelia Limes MD CHIEF COMPLAINT: Left sided flaccidity and aphasia  Brief History   84 yo male former smoker with aphasia, Rt gaze, Lt side weakness.  CT angio head/neck showed Rt MCA M1 occlusion.  Tx with tPA and then neuro IR did mechanical thrombectomy.  Developed ICH, SAH, IVH after procedure.  Intubated for airway protection.  He was previously on xarelto for hx of DVT, but recently stopped due to leg infections.   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, chronic ischemic microangiopathy CT angio head/neck 9/23 >> Rt MCA M1 occlusion, apical lung fibrosis CT head 9/24 >> decompression of Lt frontal lobe hemorrhage into ventricular system, stable SAH; frontal/parietal, occipital, lt temporal lobe hemorrhage MRI brain 9/24 >> large Acute Rt MCA infarct involving cortex and basal ganglia; b/l parenchymal, SH, and IVH; moderate atrophy and small vessel ischemic disease Doppler legs 9/24 >> no DVT Echo 9/25 >> EF 65 to 70%, mild AS, mild MR, mod LVH CT head 9/27 >> decrease size ICH, SAH, IVH; evolution of Rt MCA infarct  Micro Data: Blood 9/24 >> Urine 9/24 >> negative  Antimicrobials:  Rocephin 9/23 >>   Subjective:  Tolerates PSVT but has not had the mental status for extubation.  Neurology's been discussing goals of care with patient's family, son who is out of town  Objective   Blood pressure 138/64, pulse 74, temperature 99.1 F (37.3 C), temperature source Axillary, resp. rate 17, height 5\' 10"  (1.778 m), weight 79.5 kg,  SpO2 99 %.    Vent Mode: CPAP;PSV FiO2 (%):  [30 %] 30 % Set Rate:  [14 bmp] 14 bmp Vt Set:  [580 mL] 580 mL PEEP:  [5 cmH20] 5 cmH20 Pressure Support:  [5 cmH20] 5 cmH20 Plateau Pressure:  [12 cmH20-15 cmH20] 13 cmH20   Intake/Output Summary (Last 24 hours) at 06/14/2018 0940 Last data filed at 06/14/2018 0900 Gross per 24 hour  Intake 1200 ml  Output 2175 ml  Net -975 ml   Filed Weights   2018/07/01 1617 06/10/18 0500 06/11/18 0500  Weight: 78.1 kg 80.3 kg 79.5 kg    Examination:  General -ill-appearing elderly man, mechanically ventilated Eyes -pupils equal, sluggishly reactive ENT -ET tube in place Cardiac -regular with some PVCs, no murmur Chest -clear bilaterally Abdomen -soft, nondistended, positive bowel sounds Extremities -left upper extremity contracture, 1+ edema Skin -lower extremity dressings clean and dry Neuro -did not wake to voice or gentle stimulation   Assessment & Plan:   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 ICH, SAH, and IVH after procedure. -Based on neurology evaluation, our evaluation chance for meaningful neurological recovery here very poor -Agree with DNR status -Agree with plans for transition to comfort measures when family arrives.  Patient's on is out of town, is planning to arrive 06/15/2018.  Transition to comfort at that time.  Acute respiratory failure, compromised airway due to neurological status Fibrotic changes on chest imaging. -Not a candidate for extubation due to poor airway protection, neurological status. -Based on goals of care  discussions no plans for tracheostomy, instead plan for transition to comfort when family ready to do so.  Hypertension. - SBP goal < 160  Sacral wound, and lower leg ulcers, suspected cellulitis - present prior to admission -Ceftriaxone currently day 8, plan discontinue 9/30 - Wound care recs >> cleanse b/l legs with soap and water, pat dry, apply aquacel Ag to nonintact areas, wrap with  kerlix and tape, change Tuesday/Friday  Anemia of critical illness. - defer labs given current goals.   Chronic foley. - leave in place  Intermittent runs of VT reported, . - DNR confirmed 9/30 - telemetry in place, no intervention   Disposition / Summary of Today's Plan 06/14/18   Confirmed plan for withdrawal of care on 10/1 with the patient's son arrives in [place].  We will try to simplify his regimen, stop ceftriaxone currently day 8.  Stop lab draws.    Diet: tube feeds Pain/Anxiety/Delirium protocol: prn versed, fentanyl DVT prophylaxis:SCD GI prophylaxis: pepcid Mobility: bed rest  Code Status: DNR Family Communication: discussions with son 9/29 as above  Labs   CBC: Recent Labs  Lab 06/29/18 1600  06/10/18 0631 06/11/18 1232 06/12/18 0234 06/13/18 0331 06/14/18 0303  WBC 12.1*   < > 11.0* 11.4* 9.6 10.6* 10.5  NEUTROABS 5.8  --   --   --   --   --   --   HGB 11.7*   < > 10.0* 10.2* 10.6* 10.9* 11.2*  HCT 35.9*   < > 29.7* 30.5* 32.1* 32.3* 33.2*  MCV 96.0   < > 94.6 93.6 95.0 93.9 93.8  PLT 495*   < > 397 406* 406* 413* 423*   < > = values in this interval not displayed.   Basic Metabolic Panel: Recent Labs  Lab 06-29-18 2052  06/09/18 0901 06/09/18 1730 06/10/18 0631 06/11/18 1232 06/12/18 0234 06/13/18 0331 06/14/18 0303  NA  --    < >  --   --  136 135 135 132* 131*  K  --    < >  --   --  3.1* 3.5 3.8 4.1 4.4  CL  --    < >  --   --  106 102 100 99 98  CO2  --    < >  --   --  23 24 25 24 25   GLUCOSE  --    < >  --   --  156* 128* 126* 133* 115*  BUN  --    < >  --   --  11 13 16 21 19   CREATININE  --    < >  --   --  0.60* 0.59* 0.57* 0.51* 0.55*  CALCIUM  --    < >  --   --  8.0* 8.1* 8.3* 8.1* 8.4*  MG 1.8  --  1.6* 6.7* 1.7  --   --   --   --   PHOS 3.9  --  2.0* 1.7* 1.4*  --   --   --   --    < > = values in this interval not displayed.   GFR: Estimated Creatinine Clearance: 65.9 mL/min (A) (by C-G formula based on SCr of 0.55  mg/dL (L)). Recent Labs  Lab 06/11/18 1232 06/12/18 0234 06/13/18 0331 06/14/18 0303  WBC 11.4* 9.6 10.6* 10.5   Liver Function Tests: Recent Labs  Lab 06-29-2018 1600  AST 24  ALT 19  ALKPHOS 70  BILITOT 0.9  PROT 5.9*  ALBUMIN 2.9*   No results for input(s): LIPASE, AMYLASE in the last 168 hours. No results for input(s): AMMONIA in the last 168 hours. ABG    Component Value Date/Time   PHART 7.421 06/08/2018 0452   PCO2ART 32.2 06/08/2018 0452   PO2ART 118.0 (H) 06/08/2018 0452   HCO3 20.9 06/08/2018 0452   TCO2 22 06/08/2018 0452   ACIDBASEDEF 3.0 (H) 06/08/2018 0452   O2SAT 99.0 06/08/2018 0452    Coagulation Profile: Recent Labs  Lab June 20, 2018 1600  INR 1.26   Cardiac Enzymes: No results for input(s): CKTOTAL, CKMB, CKMBINDEX, TROPONINI in the last 168 hours. HbA1C: Hgb A1c MFr Bld  Date/Time Value Ref Range Status  06/08/2018 04:35 AM 6.2 (H) 4.8 - 5.6 % Final    Comment:    (NOTE) Pre diabetes:          5.7%-6.4% Diabetes:              >6.4% Glycemic control for   <7.0% adults with diabetes    CBG: Recent Labs  Lab 06/10/18 2330 06/11/18 0402 06/11/18 0751 06/11/18 1207 06/11/18 1634  GLUCAP 132* 128* 124* 114* 136*     Levy Pupa, MD, PhD 06/14/2018, 9:49 AM [facility] [phone] or if no answer [phone]

## 2018-06-15 DIAGNOSIS — J9600 Acute respiratory failure, unspecified whether with hypoxia or hypercapnia: Secondary | ICD-10-CM

## 2018-06-15 MED ORDER — GLYCOPYRROLATE 1 MG PO TABS
1.0000 mg | ORAL_TABLET | ORAL | Status: DC | PRN
  Filled 2018-06-15: qty 1

## 2018-06-15 MED ORDER — MORPHINE BOLUS VIA INFUSION
1.0000 mg | INTRAVENOUS | Status: DC | PRN
  Administered 2018-06-17: 1 mg via INTRAVENOUS
  Filled 2018-06-15: qty 1

## 2018-06-15 MED ORDER — GLYCOPYRROLATE 0.2 MG/ML IJ SOLN
0.2000 mg | INTRAMUSCULAR | Status: DC | PRN
  Administered 2018-06-17 – 2018-06-22 (×5): 0.2 mg via INTRAVENOUS
  Filled 2018-06-15 (×5): qty 1

## 2018-06-15 MED ORDER — GLYCOPYRROLATE 0.2 MG/ML IJ SOLN: 0.2000 mg | INTRAMUSCULAR | Status: DC | PRN

## 2018-06-15 MED ORDER — MORPHINE 100MG IN NS 100ML (1MG/ML) PREMIX INFUSION
1.0000 mg/h | INTRAVENOUS | Status: DC
  Administered 2018-06-15: 2 mg/h via INTRAVENOUS
  Administered 2018-06-16: 1 mg/h via INTRAVENOUS
  Administered 2018-06-18 – 2018-06-22 (×5): 4 mg/h via INTRAVENOUS
  Filled 2018-06-15 (×7): qty 100

## 2018-06-15 NOTE — Progress Notes (Signed)
Chaplain came to pt's room because she heard through nurse huddle that pt would be extubated at 11 AM today.  But no one was in room with patient, and chaplain received word that the son's flight was held up and that now the extubation would take place at 5 PM.  Chaplain offered silent prayer for pt bedside.  Unit chaplain will refer family to incoming on-call chaplain. Lynnell Chad Pager [phone]

## 2018-06-15 NOTE — Progress Notes (Signed)
NAME:  Justin [name], MRN:  [id number], DOB:  1927-10-08, LOS: 8 ADMISSION DATE:  06/11/2018, CONSULTATION DATE:  05/27/2018 REFERRING MD:  Otelia Limes MD CHIEF COMPLAINT: Left sided flaccidity and aphasia  Brief History   84 yo male former smoker with aphasia, Rt gaze, Lt side weakness.  CT angio head/neck showed Rt MCA M1 occlusion.  Tx with tPA and then neuro IR did mechanical thrombectomy.  Developed ICH, SAH, IVH after procedure.  Intubated for airway protection.  He was previously on xarelto for hx of DVT, but recently stopped due to leg infections.   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, chronic ischemic microangiopathy CT angio head/neck 9/23 >> Rt MCA M1 occlusion, apical lung fibrosis CT head 9/24 >> decompression of Lt frontal lobe hemorrhage into ventricular system, stable SAH; frontal/parietal, occipital, lt temporal lobe hemorrhage MRI brain 9/24 >> large Acute Rt MCA infarct involving cortex and basal ganglia; b/l parenchymal, SH, and IVH; moderate atrophy and small vessel ischemic disease Doppler legs 9/24 >> no DVT Echo 9/25 >> EF 65 to 70%, mild AS, mild MR, mod LVH CT head 9/27 >> decrease size ICH, SAH, IVH; evolution of Rt MCA infarct  Micro Data: Blood 9/24 >> negative Urine 9/24 >> negative  Antimicrobials:  Rocephin 9/23 >> 9/30  Subjective:  No clinical change Currently on PSV but no intention to extubate due to mental status  Objective   Blood pressure 120/65, pulse 83, temperature (!) 100.4 F (38 C), temperature source Axillary, resp. rate 20, height 5\' 10"  (1.778 m), weight 79.5 kg, SpO2 98 %.    Vent Mode: PSV;CPAP FiO2 (%):  [30  %] 30 % Set Rate:  [14 bmp] 14 bmp Vt Set:  [580 mL] 580 mL PEEP:  [5 cmH20] 5 cmH20 Pressure Support:  [5 cmH20] 5 cmH20 Plateau Pressure:  [12 cmH20] 12 cmH20   Intake/Output Summary (Last 24 hours) at 06/15/2018 0849 Last data filed at 06/15/2018 0600 Gross per 24 hour  Intake 660 ml  Output 1415 ml  Net -755 ml   Filed Weights   05/17/2018 1617 06/10/18 0500 06/11/18 0500  Weight: 78.1 kg 80.3 kg 79.5 kg    Examination:  General -ill-appearing elderly man, ventilated Eyes -pupils equal, reactivity not checked ENT -in the tracheal tube in place, no oral lesions Cardiac -regular, occasional PVCs, no murmur Chest -there are bilaterally Abdomen -soft, nondistended, positive bowel sounds Extremities -left upper extremity contracted, 1+ lower extremity edema Skin -lower extremity dressings clean and dry Neuro -completely comatose, did not grimace or move with stimulation or pain   Assessment & Plan:   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 ICH, SAH, and IVH after procedure. -Based on neurology evaluation, our evaluation chance for meaningful neurological recovery here very poor -Agree with DNR status -Planning for transition to comfort measures when the patient's son arrives from out of town on 10/1.  We will assist with the transition off mechanical ventilation.  Acute respiratory failure, compromised airway due to neurological status Fibrotic changes on chest imaging. -Planning for transition to comfort care and controlled extubation when family is ready to do so, probably 10/1  Hypertension. -  SBP goal < 160  Sacral wound, and lower leg ulcers, suspected cellulitis - present prior to admission -Ceftriaxone course completed on 9/30 - Wound care recs >> cleanse b/l legs with soap and water, pat dry, apply aquacel Ag to nonintact areas, wrap with kerlix and tape, change Tuesday/Friday  Anemia of critical illness. -Deferring labs at this time  Chronic  foley. -Plan to leave Foley catheter in place  Intermittent runs of VT reported, . - DNR confirmed 9/30 -Following, no intervention planned  Disposition / Summary of Today's Plan 06/15/18   Planning for withdrawal of care 10/1 when the patient's son has arrived from out of town.    Diet: tube feeds Pain/Anxiety/Delirium protocol: prn versed, fentanyl DVT prophylaxis:SCD GI prophylaxis: pepcid Mobility: bed rest  Code Status: DNR Family Communication: discussions with son 9/29 as above  Labs   CBC: Recent Labs  Lab 06/10/18 0631 06/11/18 1232 06/12/18 0234 06/13/18 0331 06/14/18 0303  WBC 11.0* 11.4* 9.6 10.6* 10.5  HGB 10.0* 10.2* 10.6* 10.9* 11.2*  HCT 29.7* 30.5* 32.1* 32.3* 33.2*  MCV 94.6 93.6 95.0 93.9 93.8  PLT 397 406* 406* 413* 423*   Basic Metabolic Panel: Recent Labs  Lab 06/09/18 0901 06/09/18 1730 06/10/18 0631 06/11/18 1232 06/12/18 0234 06/13/18 0331 06/14/18 0303  NA  --   --  136 135 135 132* 131*  K  --   --  3.1* 3.5 3.8 4.1 4.4  CL  --   --  106 102 100 99 98  CO2  --   --  23 24 25 24 25   GLUCOSE  --   --  156* 128* 126* 133* 115*  BUN  --   --  11 13 16 21 19   CREATININE  --   --  0.60* 0.59* 0.57* 0.51* 0.55*  CALCIUM  --   --  8.0* 8.1* 8.3* 8.1* 8.4*  MG 1.6* 6.7* 1.7  --   --   --   --   PHOS 2.0* 1.7* 1.4*  --   --   --   --    GFR: Estimated Creatinine Clearance: 65.9 mL/min (A) (by C-G formula based on SCr of 0.55 mg/dL (L)). Recent Labs  Lab 06/11/18 1232 06/12/18 0234 06/13/18 0331 06/14/18 0303  WBC 11.4* 9.6 10.6* 10.5   Liver Function Tests: No results for input(s): AST, ALT, ALKPHOS, BILITOT, PROT, ALBUMIN in the last 168 hours. No results for input(s): LIPASE, AMYLASE in the last 168 hours. No results for input(s): AMMONIA in the last 168 hours. ABG    Component Value Date/Time   PHART 7.421 06/08/2018 0452   PCO2ART 32.2 06/08/2018 0452   PO2ART 118.0 (H) 06/08/2018 0452   HCO3 20.9 06/08/2018 0452   TCO2  22 06/08/2018 0452   ACIDBASEDEF 3.0 (H) 06/08/2018 0452   O2SAT 99.0 06/08/2018 0452    Coagulation Profile: No results for input(s): INR, PROTIME in the last 168 hours. Cardiac Enzymes: No results for input(s): CKTOTAL, CKMB, CKMBINDEX, TROPONINI in the last 168 hours. HbA1C: Hgb A1c MFr Bld  Date/Time Value Ref Range Status  06/08/2018 04:35 AM 6.2 (H) 4.8 - 5.6 % Final    Comment:    (NOTE) Pre diabetes:          5.7%-6.4% Diabetes:              >6.4% Glycemic control for   <7.0% adults with diabetes    CBG: Recent Labs  Lab 06/10/18 2330 06/11/18 0402 06/11/18 0751 06/11/18  1207 06/11/18 1634  GLUCAP 132* 128* 124* 114* 136*     Levy Pupa, MD, PhD 06/15/2018, 8:49 AM [facility] [phone] or if no answer [phone]

## 2018-06-15 NOTE — Plan of Care (Signed)
  Problem: Education: Goal: Knowledge of General Education information will improve Description Including pain rating scale, medication(s)/side effects and non-pharmacologic comfort measures Outcome: Progressing Note:  Spoke with wife about plan of care.

## 2018-06-15 NOTE — Progress Notes (Signed)
STROKE TEAM PROGRESS NOTE   SUBJECTIVE (INTERVAL HISTORY) No family is at the bedside. Pt still intubated    No Neurological changes.patient's sons flight is delayed until 5 PM hence family wants to wait till his arrival for withdrawal of care this evening  OBJECTIVE Temp:  [98.4 F (36.9 C)-100.4 F (38 C)] 100.4 F (38 C) (10/01 0800) Pulse Rate:  [69-96] 83 (10/01 0745) Cardiac Rhythm: Normal sinus rhythm (09/30 2000) Resp:  [15-24] 20 (10/01 0745) BP: (115-146)/(59-78) 120/65 (10/01 0745) SpO2:  [97 %-100 %] 98 % (10/01 0746) FiO2 (%):  [30 %] 30 % (10/01 0746)  Recent Labs  Lab 06/10/18 2330 06/11/18 0402 06/11/18 0751 06/11/18 1207 06/11/18 1634  GLUCAP 132* 128* 124* 114* 136*   Recent Labs  Lab 06/09/18 0901 06/09/18 1730 06/10/18 0631 06/11/18 1232 06/12/18 0234 06/13/18 0331 06/14/18 0303  NA  --   --  136 135 135 132* 131*  K  --   --  3.1* 3.5 3.8 4.1 4.4  CL  --   --  106 102 100 99 98  CO2  --   --  23 24 25 24 25   GLUCOSE  --   --  156* 128* 126* 133* 115*  BUN  --   --  11 13 16 21 19   CREATININE  --   --  0.60* 0.59* 0.57* 0.51* 0.55*  CALCIUM  --   --  8.0* 8.1* 8.3* 8.1* 8.4*  MG 1.6* 6.7* 1.7  --   --   --   --   PHOS 2.0* 1.7* 1.4*  --   --   --   --    No results for input(s): AST, ALT, ALKPHOS, BILITOT, PROT, ALBUMIN in the last 168 hours. Recent Labs  Lab 06/10/18 0631 06/11/18 1232 06/12/18 0234 06/13/18 0331 06/14/18 0303  WBC 11.0* 11.4* 9.6 10.6* 10.5  HGB 10.0* 10.2* 10.6* 10.9* 11.2*  HCT 29.7* 30.5* 32.1* 32.3* 33.2*  MCV 94.6 93.6 95.0 93.9 93.8  PLT 397 406* 406* 413* 423*   No results for input(s): CKTOTAL, CKMB, CKMBINDEX, TROPONINI in the last 168 hours. No results for input(s): LABPROT, INR in the last 72 hours. No results for input(s): COLORURINE, LABSPEC, PHURINE, GLUCOSEU, HGBUR, BILIRUBINUR, KETONESUR, PROTEINUR, UROBILINOGEN, NITRITE, LEUKOCYTESUR in the last 72 hours.  Invalid input(s): APPERANCEUR      Component Value Date/Time   CHOL 103 06/08/2018 0435   TRIG 90 06/08/2018 0435   HDL 24 (L) 06/08/2018 0435   CHOLHDL 4.3 06/08/2018 0435   VLDL 18 06/08/2018 0435   LDLCALC 61 06/08/2018 0435   Lab Results  Component Value Date   HGBA1C 6.2 (H) 06/08/2018      Component Value Date/Time   LABOPIA NONE DETECTED 09/26/2012 0034   COCAINSCRNUR NONE DETECTED 09/26/2012 0034   LABBENZ NONE DETECTED 09/26/2012 0034   AMPHETMU NONE DETECTED 09/26/2012 0034   THCU NONE DETECTED 09/26/2012 0034   LABBARB NONE DETECTED 09/26/2012 0034    No results for input(s): ETH in the last 168 hours.   IMAGING  Ct Angio Head W Or Wo Contrast Ct Angio Neck W Or Wo Contrast 2018-06-29 IMPRESSION:  1. Emergent large vessel occlusion of the right middle cerebral artery M1 segment with minimal collateral flow demonstrated in the right MCA territory.  2. Biapical pulmonary opacities most consistent with fibrosis or scarring. A superimposed mass or consolidation would be difficult to exclude. If prior chest CTs exists, comparison would be helpful.  3. No  arterial occlusion or hemodynamically significant stenosis of the major arteries of the neck.    Ct Head Wo Contrast 05/28/2018 IMPRESSION:  1.  No acute intracranial abnormality.  2. Atrophy with moderate to advanced chronic small vessel ischemia.    Ct Head Wo Contrast 05/26/2018 IMPRESSION:  1. Multiple new intraparenchymal hematomas with hematocrit levels consistent with acute and suspected active hemorrhage.  2. LEFT lateral intraventricular hemorrhage, possible parenchymal extension.  3. Scattered extra-axial blood products, confounded by postcontrast imaging.   Ct Head Code Stroke Wo Contrast 06/04/2018 IMPRESSION:  1. No acute hemorrhage or mass lesion.  2. ASPECTS is 10.  3. 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    Ct Head Wo Contrast 06/08/2018 IMPRESSION:  1. Interval decompression of  left paramedian frontal lobe brain parenchymal hemorrhage into the ventricular system with increased blood products in both lateral ventricular atria. Stable ventricle size.  2. Otherwise stable brain parenchymal hemorrhage in paramedian frontal, parietal, and occipital lobes as well as the left temporal lobe.  3. Stable moderate volume of subarachnoid hemorrhage over convexities and cerebellum.  4. No new acute intracranial abnormality identified.    Ct Head Wo Contrast 06/11/2018 IMPRESSION:  1. Interval decrease in size of multifocal intraparenchymal cerebral hemorrhages, with decreased volume of subarachnoid and intraventricular hemorrhage. Stable ventricular size.  2. Continued interval evolution of large right MCA territory infarct, stable in size and distribution relative to recent MRI. No evidence for hemorrhagic transformation or significant mass effect.  3. No other new acute intracranial abnormality.    MRI and MRA 06/08/2018 1. Acute large right MCA infarct primarily involving cortex and basal ganglia. 2. Bilateral parenchymal hemorrhages, subarachnoid hemorrhage, and intraventricular hemorrhage, not significantly changed from today's earlier CT. 3. Moderate cerebral atrophy and chronic small vessel ischemic disease. 4. Negative head MRA. Continued patency of the revascularized right MCA.   Dg Chest Port 1 View 06/13/2018 IMPRESSION:  No active disease.  Endotracheal tube tip 4.8 cm above the carina.    TTE - Technically difficult study. LVEF 65-70%, moderate LVH, normal   wall motion, mild aortic stenosis - mean gradient of 12 mmHg -   AVA around 1.7 cm2, trace to mild MR, normal LA size, dilated   IVC.   LE venous doppler Right: There is no evidence of deep vein thrombosis in the lower extremity. However, portions of this examination were limited- see technologist comments above. Left: There is no evidence of deep vein thrombosis in the lower extremity. However,  portions of this examination were limited- see technologist comments above.     PHYSICAL EXAM  Temp:  [98.4 F (36.9 C)-100.4 F (38 C)] 100.4 F (38 C) (10/01 0800) Pulse Rate:  [69-96] 83 (10/01 0745) Resp:  [15-24] 20 (10/01 0745) BP: (115-146)/(59-78) 120/65 (10/01 0745) SpO2:  [97 %-100 %] 98 % (10/01 0746) FiO2 (%):  [30 %] 30 % (10/01 0746)  General - Well nourished, well developed, intubated not on sedation.  Ophthalmologic - fundi not visualized due to noncooperation.  Cardiovascular - Regular rate and rhythm.  Skin - b/l LEs foreleg b/l ulcers and wounds, with red/purple discoloration, on dressing.   Neuro - intubated not on sedation. Eyes closed, but open on voice and remained open, however, not tracking and not following commands. PERRL, eye mid position, doll's eyes positive. Corneal weak on the left but positive on the right. Positive gag and cough. Facial symmetry not able to test due to ET tube. On pain  stimulation, slight withdraw on UEs but 2+/5 LLE and 2/5 RLE. LUE increased tone with decorticate posturing, RUE flaccid with decreased tone. DTR diminished and no babinski. Sensation, coordination and gait not tested.   ASSESSMENT/PLAN Justin [name] is a 84 y.o. male with history of DVT on Xarelto but stopped recently after LE cellulitis, BPH on foley placed recently in [facility] admitted for aphasia, left facial droop, left weakness and neglect.  tPA given and mechanical thrombectomy  Stroke:  right large MCA infarct mainly at cortex and BG due to right M1 occlusion s/p tPA and IR with TICI3 reperfusion, embolic pattern, source unclear  Resultant intubated and left sided weakness and right gaze preference  CT head no acute finding  CTA head and neck - right M1 occlusion  DSA - right M1 occlusion s/p TICI3 reperfusion  MRI  right large MCA infarct mainly at cortex and BG   MRA unremarkable after IR  2D Echo EF 65-70%  LDL 61  HgbA1c 6.2  Heparin subq  for VTE prophylaxis due to b/l LE ulcers and ICH  No antithrombotic prior to admission, now on No antithrombotic   Ongoing aggressive stroke risk factor management  Therapy recommendations:  Pending   Disposition:  Pending   Per Dr Roda Shutters - Updated wife over the phone, wife will try to come in but can not promise due to her own medical issue. Wife would like to continue current management for the next 2-3 days, if no improvement, she would like to talk to palliative care about comfort care as pt had clear statement that he would not want to be kept alive without QOL. I also discussed with son over the phone and updated the same.  Code status DNR  ICH with IVH likely due to tPA s/p reversal  tPA reversed with TXA and cryo  CT post procedure showed b/l frontal ICH, left BG and temporal ICH with IVH and SAH  Repeat CT stable with left frontal/BG ICH decompressed to lateral ventricle  MRI confirmed ICH, IVH and SAH, no hydrocephalus. No significant CAA either.   Fibrinogen 370->446  Repeat CT 06/11/18 stable hematoma, infarct and ventricle size, blood product less than before  LE venous stasis with ulcers and cellulitis and hx of DVT  Wound care on board  Has hx of DVT was on Xarelto at home but it was discontinued on recent discharge on 06/02/18.   LE venous doppler no DVT this admission  Off rocephin   Blood culture NGTD  Leukocytosis and fever  Spike fever last night - Tmax 101  Blood culture NGTD  UA WBC 6-10  Urine culture NGTD  WBC 14->12.1->12.7->11.3->11.0->pending today  CXR 06/10/18 likely multifocal pneumonia  CCM on board  Now off rocephin (started 06/26/2018)   Short run of V-tach and transient cardiac arrest  Pt also had brief cardiac arrest during IR procedure and under anesthesia - reversed on its own  Captured on tele 06/10/18. Resolved on its own  Close monitoring  Hypertension/hypotension . Stable  BP goal < 160  Off cleviprex  Off  neo  Labetalol PRN  Other Stroke Risk Factors  Advanced age  Other Active Problems  BPH - on foley catheter  Bmet and CBC today as above  Hospital day # 8  This patient is critically ill due to right MCA infarct, ICH, SAH, IVH due to tPA, LE ulcers, hypotension and at significant risk of neurological worsening, death form cerebral edema, brain herniation, recurrent stroke, hematoma expansion,  DVT, paradoxical emboli. This patient's care requires constant monitoring of vital signs, hemodynamics, respiratory and cardiac monitoring, review of multiple databases, neurological assessment, discussion with family, other specialists and medical decision making of high complexity. I spent 32 minutes of neurocritical care time in the care of this patient.  Discussed with Dr. Delton Coombes critical care medicine patient's RN.Patient's son is arriving this evening and likely plans to withdraw ventilatory support and make patient comfort care only. Delia Heady, MD Medical Director [facility] Pager: [phone] 06/15/2018 2:07 PM  To contact Stroke Continuity provider, please refer to [email or web address]. After hours, contact General Neurology

## 2018-06-15 NOTE — Procedures (Signed)
Extubation Procedure Note  Patient Details:   Name: Justin [name] DOB: 1928/06/23 MRN: [id number]   Airway Documentation:  Airway (Active)  Secured at (cm) 23 cm 06/15/2018  7:40 PM  Measured From Lips 06/15/2018  7:40 PM  Secured Location Center 06/15/2018  7:40 PM  Secured By [employer] 06/15/2018  7:40 PM  Tube Holder Repositioned Yes 06/15/2018  7:40 PM  Cuff Pressure (cm H2O) 22 cm H2O 06/15/2018  7:40 PM  Site Condition Dry 06/15/2018  3:27 PM   Vent end date: 06/15/18 Vent end time: 2120   Evaluation  O2 sats: stable throughout Complications: No apparent complications Patient did tolerate procedure well. Bilateral Breath Sounds: Clear, Diminished  Patient extubated to RA per withdrawal of life order. RN at bedside. No complications or distress noted.  Clent Ridges 06/15/2018, 9:29 PM

## 2018-06-15 NOTE — Progress Notes (Signed)
Pts family just arrived. They are asking to speak to a MD. CCM [phone] paged. Dr Molli Knock is unable to come. He told me to page Dr Delton Coombes. Dr Delton Coombes paged, and is talking to the family over the phone.

## 2018-06-15 DEATH — deceased

## 2018-06-16 NOTE — Progress Notes (Signed)
Patient received from 4N to room 6N28,  responsive to pain only. Pt. DNR and comfort care only.No family at bedside.

## 2018-06-16 NOTE — Progress Notes (Signed)
Chap[ain responded to spiritual care request. Pt in comfort care. Son came in last evening. Unknown if he will return. Patient actively dying but "It may be awhile."  Possible move to another floor. Chaplain offered prayer. Will follow-up with palliative care chaplain, but also available if pt remains on this unit.  Lynnell Chad [phone]

## 2018-06-16 NOTE — Progress Notes (Signed)
STROKE TEAM PROGRESS NOTE   SUBJECTIVE (INTERVAL HISTORY) No family is at the bedside. Pt was made comfort care by family last night. She is not extubated. She is on morphine drip and looks comfortable OBJECTIVE Temp:  [99.6 F (37.6 C)-100.6 F (38.1 C)] 99.6 F (37.6 C) (10/01 2000) Pulse Rate:  [63-86] 86 (10/02 0700) Cardiac Rhythm: Normal sinus rhythm (10/02 0000) Resp:  [7-22] 9 (10/02 0700) BP: (97-138)/(48-79) 106/48 (10/02 0518) SpO2:  [78 %-99 %] 78 % (10/02 0700) FiO2 (%):  [30 %] 30 % (10/01 1940)  Recent Labs  Lab 06/10/18 2330 06/11/18 0402 06/11/18 0751 06/11/18 1207 06/11/18 1634  GLUCAP 132* 128* 124* 114* 136*   Recent Labs  Lab 06/09/18 1730  06/10/18 0631 06/11/18 1232 06/12/18 0234 06/13/18 0331 06/14/18 0303  NA  --   --  136 135 135 132* 131*  K  --   --  3.1* 3.5 3.8 4.1 4.4  CL  --   --  106 102 100 99 98  CO2  --   --  23 24 25 24 25   GLUCOSE  --   --  156* 128* 126* 133* 115*  BUN  --   --  11 13 16 21 19   CREATININE  --   --  0.60* 0.59* 0.57* 0.51* 0.55*  CALCIUM  --    < > 8.0* 8.1* 8.3* 8.1* 8.4*  MG 6.7*  --  1.7  --   --   --   --   PHOS 1.7*  --  1.4*  --   --   --   --    < > = values in this interval not displayed.   No results for input(s): AST, ALT, ALKPHOS, BILITOT, PROT, ALBUMIN in the last 168 hours. Recent Labs  Lab 06/10/18 0631 06/11/18 1232 06/12/18 0234 06/13/18 0331 06/14/18 0303  WBC 11.0* 11.4* 9.6 10.6* 10.5  HGB 10.0* 10.2* 10.6* 10.9* 11.2*  HCT 29.7* 30.5* 32.1* 32.3* 33.2*  MCV 94.6 93.6 95.0 93.9 93.8  PLT 397 406* 406* 413* 423*   No results for input(s): CKTOTAL, CKMB, CKMBINDEX, TROPONINI in the last 168 hours. No results for input(s): LABPROT, INR in the last 72 hours. No results for input(s): COLORURINE, LABSPEC, PHURINE, GLUCOSEU, HGBUR, BILIRUBINUR, KETONESUR, PROTEINUR, UROBILINOGEN, NITRITE, LEUKOCYTESUR in the last 72 hours.  Invalid input(s): APPERANCEUR     Component Value Date/Time    CHOL 103 06/08/2018 0435   TRIG 90 06/08/2018 0435   HDL 24 (L) 06/08/2018 0435   CHOLHDL 4.3 06/08/2018 0435   VLDL 18 06/08/2018 0435   LDLCALC 61 06/08/2018 0435   Lab Results  Component Value Date   HGBA1C 6.2 (H) 06/08/2018      Component Value Date/Time   LABOPIA NONE DETECTED 09/26/2012 0034   COCAINSCRNUR NONE DETECTED 09/26/2012 0034   LABBENZ NONE DETECTED 09/26/2012 0034   AMPHETMU NONE DETECTED 09/26/2012 0034   THCU NONE DETECTED 09/26/2012 0034   LABBARB NONE DETECTED 09/26/2012 0034    No results for input(s): ETH in the last 168 hours.   IMAGING  Ct Angio Head W Or Wo Contrast Ct Angio Neck W Or Wo Contrast 05/28/2018 IMPRESSION:  1. Emergent large vessel occlusion of the right middle cerebral artery M1 segment with minimal collateral flow demonstrated in the right MCA territory.  2. Biapical pulmonary opacities most consistent with fibrosis or scarring. A superimposed mass or consolidation would be difficult to exclude. If prior chest CTs exists, comparison would  be helpful.  3. No arterial occlusion or hemodynamically significant stenosis of the major arteries of the neck.    Ct Head Wo Contrast 05/28/2018 IMPRESSION:  1.  No acute intracranial abnormality.  2. Atrophy with moderate to advanced chronic small vessel ischemia.    Ct Head Wo Contrast 05/23/2018 IMPRESSION:  1. Multiple new intraparenchymal hematomas with hematocrit levels consistent with acute and suspected active hemorrhage.  2. LEFT lateral intraventricular hemorrhage, possible parenchymal extension.  3. Scattered extra-axial blood products, confounded by postcontrast imaging.   Ct Head Code Stroke Wo Contrast 05/16/2018 IMPRESSION:  1. No acute hemorrhage or mass lesion.  2. ASPECTS is 10.  3. 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    Ct Head Wo Contrast 06/08/2018 IMPRESSION:  1. Interval decompression of left paramedian frontal lobe  brain parenchymal hemorrhage into the ventricular system with increased blood products in both lateral ventricular atria. Stable ventricle size.  2. Otherwise stable brain parenchymal hemorrhage in paramedian frontal, parietal, and occipital lobes as well as the left temporal lobe.  3. Stable moderate volume of subarachnoid hemorrhage over convexities and cerebellum.  4. No new acute intracranial abnormality identified.    Ct Head Wo Contrast 06/11/2018 IMPRESSION:  1. Interval decrease in size of multifocal intraparenchymal cerebral hemorrhages, with decreased volume of subarachnoid and intraventricular hemorrhage. Stable ventricular size.  2. Continued interval evolution of large right MCA territory infarct, stable in size and distribution relative to recent MRI. No evidence for hemorrhagic transformation or significant mass effect.  3. No other new acute intracranial abnormality.    MRI and MRA 06/08/2018 1. Acute large right MCA infarct primarily involving cortex and basal ganglia. 2. Bilateral parenchymal hemorrhages, subarachnoid hemorrhage, and intraventricular hemorrhage, not significantly changed from today's earlier CT. 3. Moderate cerebral atrophy and chronic small vessel ischemic disease. 4. Negative head MRA. Continued patency of the revascularized right MCA.   Dg Chest Port 1 View 06/11/2018 IMPRESSION:  No active disease.  Endotracheal tube tip 4.8 cm above the carina.    TTE - Technically difficult study. LVEF 65-70%, moderate LVH, normal   wall motion, mild aortic stenosis - mean gradient of 12 mmHg -   AVA around 1.7 cm2, trace to mild MR, normal LA size, dilated   IVC.   LE venous doppler Right: There is no evidence of deep vein thrombosis in the lower extremity. However, portions of this examination were limited- see technologist comments above. Left: There is no evidence of deep vein thrombosis in the lower extremity. However, portions of this examination  were limited- see technologist comments above.     PHYSICAL EXAM  Temp:  [99.6 F (37.6 C)-100.6 F (38.1 C)] 99.6 F (37.6 C) (10/01 2000) Pulse Rate:  [63-86] 86 (10/02 0700) Resp:  [7-22] 9 (10/02 0700) BP: (97-138)/(48-79) 106/48 (10/02 0518) SpO2:  [78 %-99 %] 78 % (10/02 0700) FiO2 (%):  [30 %] 30 % (10/01 1940)  General - Well nourished, well developed, intubated not on sedation.  Ophthalmologic - fundi not visualized due to noncooperation.  Cardiovascular - Regular rate and rhythm.  Skin - b/l LEs foreleg b/l ulcers and wounds, with red/purple discoloration, on dressing.   Neuro - Eyes closed, does not respond to sternal rub. not tracking and not following commands. PERRL, eye mid position, doll's eyes positive. Corneal weak on the left but positive on the right. Positive gag and cough. Facial symmetry not able to test due to ET tube. On pain stimulation,  slight withdraw on UEs but 2+/5 LLE and 2/5 RLE. LUE increased tone with decorticate posturing, RUE flaccid with decreased tone. DTR diminished and no babinski. Sensation, coordination and gait not tested.   ASSESSMENT/PLAN Mr. Justin [name] is a 84 y.o. male with history of DVT on Xarelto but stopped recently after LE cellulitis, BPH on foley placed recently in [facility] admitted for aphasia, left facial droop, left weakness and neglect.  tPA given and mechanical thrombectomy  Stroke:  right large MCA infarct mainly at cortex and BG due to right M1 occlusion s/p tPA and IR with TICI3 reperfusion, embolic pattern, source unclear  Resultant intubated and left sided weakness and right gaze preference  CT head no acute finding  CTA head and neck - right M1 occlusion  DSA - right M1 occlusion s/p TICI3 reperfusion  MRI  right large MCA infarct mainly at cortex and BG   MRA unremarkable after IR  2D Echo EF 65-70%  LDL 61  HgbA1c 6.2  Heparin subq for VTE prophylaxis due to b/l LE ulcers and ICH  No antithrombotic  prior to admission, now on No antithrombotic   Ongoing aggressive stroke risk factor management  Therapy recommendations:  Pending   Disposition:  Pending   Per Dr Roda Shutters - Updated wife over the phone, wife will try to come in but can not promise due to her own medical issue. Wife would like to continue current management for the next 2-3 days, if no improvement, she would like to talk to palliative care about comfort care as pt had clear statement that he would not want to be kept alive without QOL. I also discussed with son over the phone and updated the same.  Code status DNR  ICH with IVH likely due to tPA s/p reversal  tPA reversed with TXA and cryo  CT post procedure showed b/l frontal ICH, left BG and temporal ICH with IVH and SAH  Repeat CT stable with left frontal/BG ICH decompressed to lateral ventricle  MRI confirmed ICH, IVH and SAH, no hydrocephalus. No significant CAA either.   Fibrinogen 370->446  Repeat CT 06/11/18 stable hematoma, infarct and ventricle size, blood product less than before  LE venous stasis with ulcers and cellulitis and hx of DVT  Wound care on board  Has hx of DVT was on Xarelto at home but it was discontinued on recent discharge on 06/02/18.   LE venous doppler no DVT this admission  Off rocephin   Blood culture NGTD (5 days)  Leukocytosis and fever  Spike fever last night - Tmax 101  Blood culture NGTD  UA WBC 6-10  Urine culture NGTD  WBC 14->12.1->12.7->11.3->11.0->10.5  CXR 06/10/18 likely multifocal pneumonia  CCM on board  Now off rocephin (started 05/25/2018)   Short run of V-tach and transient cardiac arrest  Pt also had brief cardiac arrest during IR procedure and under anesthesia - reversed on its own  Captured on tele 06/10/18. Resolved on its own  Close monitoring  Hypertension/hypotension . Stable  BP goal < 160  Off cleviprex  Off neo  Labetalol PRN  Other Stroke Risk Factors  Advanced age  Other  Active Problems  BPH - on foley catheter    Hospital day # 9  The patient has not done well and has a poor prognosis and family has made decision to make him comfort care and agree with the plan. Continue morphine drip. Transfer to hospice unit. No family available at the present time the  bedside for discussion. Greater than 50% time during this 25 minute visit was spent on coordination of care about patient's condition and discussion with  Care team Delia Heady, MD Medical Director [facility] Stroke Center Pager: [phone] 06/16/2018 2:50 PM To contact Stroke Continuity provider, please refer to [email or web address]. After hours, contact General Neurology

## 2018-06-16 NOTE — Progress Notes (Signed)
Nutrition Brief Note  Chart reviewed. Pt now transitioning to comfort care.  No further nutrition interventions warranted at this time.  Please re-consult as needed.   Garnie Borchardt RD, LDN, CNSC [phone] Pager [phone] After Hours Pager    

## 2018-06-17 MED ORDER — LORAZEPAM 2 MG/ML IJ SOLN: 1.0000 mg | INTRAMUSCULAR | Status: DC | PRN

## 2018-06-17 NOTE — Care Management Important Message (Signed)
Important Message  Patient Details  Name: Justin [name] MRN: [id number] Date of Birth: August 02, 1928   Medicare Important Message Given:  No Patient could not sign due to illness Unsign copy left   [name] 06/17/2018, 3:27 PM

## 2018-06-17 NOTE — Plan of Care (Signed)
  Problem: Pain Management: Goal: Satisfaction with pain management regimen will improve Outcome: Progressing   

## 2018-06-17 NOTE — Progress Notes (Signed)
STROKE TEAM PROGRESS NOTE   SUBJECTIVE (INTERVAL HISTORY) No family is at the bedside. Pt remains on morphine drip and appears to be resting comfortably. OBJECTIVE Temp:  [98.7 F (37.1 C)] 98.7 F (37.1 C) (10/03 0543) Pulse Rate:  [40-69] 40 (10/03 0543) Resp:  [10-13] 10 (10/03 0543) BP: (117)/(63) 117/63 (10/03 0543) SpO2:  [92 %-99 %] 92 % (10/03 0543)  Recent Labs  Lab 06/10/18 2330 06/11/18 0402 06/11/18 0751 06/11/18 1207 06/11/18 1634  GLUCAP 132* 128* 124* 114* 136*   Recent Labs  Lab 06/11/18 1232 06/12/18 0234 06/13/18 0331 06/14/18 0303  NA 135 135 132* 131*  K 3.5 3.8 4.1 4.4  CL 102 100 99 98  CO2 24 25 24 25   GLUCOSE 128* 126* 133* 115*  BUN 13 16 21 19   CREATININE 0.59* 0.57* 0.51* 0.55*  CALCIUM 8.1* 8.3* 8.1* 8.4*   No results for input(s): AST, ALT, ALKPHOS, BILITOT, PROT, ALBUMIN in the last 168 hours. Recent Labs  Lab 06/11/18 1232 06/12/18 0234 06/13/18 0331 06/14/18 0303  WBC 11.4* 9.6 10.6* 10.5  HGB 10.2* 10.6* 10.9* 11.2*  HCT 30.5* 32.1* 32.3* 33.2*  MCV 93.6 95.0 93.9 93.8  PLT 406* 406* 413* 423*   No results for input(s): CKTOTAL, CKMB, CKMBINDEX, TROPONINI in the last 168 hours. No results for input(s): LABPROT, INR in the last 72 hours. No results for input(s): COLORURINE, LABSPEC, PHURINE, GLUCOSEU, HGBUR, BILIRUBINUR, KETONESUR, PROTEINUR, UROBILINOGEN, NITRITE, LEUKOCYTESUR in the last 72 hours.  Invalid input(s): APPERANCEUR     Component Value Date/Time   CHOL 103 06/08/2018 0435   TRIG 90 06/08/2018 0435   HDL 24 (L) 06/08/2018 0435   CHOLHDL 4.3 06/08/2018 0435   VLDL 18 06/08/2018 0435   LDLCALC 61 06/08/2018 0435   Lab Results  Component Value Date   HGBA1C 6.2 (H) 06/08/2018      Component Value Date/Time   LABOPIA NONE DETECTED 09/26/2012 0034   COCAINSCRNUR NONE DETECTED 09/26/2012 0034   LABBENZ NONE DETECTED 09/26/2012 0034   AMPHETMU NONE DETECTED 09/26/2012 0034   THCU NONE DETECTED 09/26/2012  0034   LABBARB NONE DETECTED 09/26/2012 0034    No results for input(s): ETH in the last 168 hours.   IMAGING  Ct Angio Head W Or Wo Contrast Ct Angio Neck W Or Wo Contrast July 07, 2018 IMPRESSION:  1. Emergent large vessel occlusion of the right middle cerebral artery M1 segment with minimal collateral flow demonstrated in the right MCA territory.  2. Biapical pulmonary opacities most consistent with fibrosis or scarring. A superimposed mass or consolidation would be difficult to exclude. If prior chest CTs exists, comparison would be helpful.  3. No arterial occlusion or hemodynamically significant stenosis of the major arteries of the neck.    Ct Head Wo Contrast 05/28/2018 IMPRESSION:  1.  No acute intracranial abnormality.  2. Atrophy with moderate to advanced chronic small vessel ischemia.    Ct Head Wo Contrast 07/07/2018 IMPRESSION:  1. Multiple new intraparenchymal hematomas with hematocrit levels consistent with acute and suspected active hemorrhage.  2. LEFT lateral intraventricular hemorrhage, possible parenchymal extension.  3. Scattered extra-axial blood products, confounded by postcontrast imaging.   Ct Head Code Stroke Wo Contrast July 07, 2018 IMPRESSION:  1. No acute hemorrhage or mass lesion.  2. ASPECTS is 10.  3. 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    Ct Head Wo Contrast 06/08/2018 IMPRESSION:  1. Interval decompression of left paramedian frontal lobe brain parenchymal hemorrhage into  the ventricular system with increased blood products in both lateral ventricular atria. Stable ventricle size.  2. Otherwise stable brain parenchymal hemorrhage in paramedian frontal, parietal, and occipital lobes as well as the left temporal lobe.  3. Stable moderate volume of subarachnoid hemorrhage over convexities and cerebellum.  4. No new acute intracranial abnormality identified.    Ct Head Wo Contrast 06/11/2018 IMPRESSION:   1. Interval decrease in size of multifocal intraparenchymal cerebral hemorrhages, with decreased volume of subarachnoid and intraventricular hemorrhage. Stable ventricular size.  2. Continued interval evolution of large right MCA territory infarct, stable in size and distribution relative to recent MRI. No evidence for hemorrhagic transformation or significant mass effect.  3. No other new acute intracranial abnormality.    MRI and MRA 06/08/2018 1. Acute large right MCA infarct primarily involving cortex and basal ganglia. 2. Bilateral parenchymal hemorrhages, subarachnoid hemorrhage, and intraventricular hemorrhage, not significantly changed from today's earlier CT. 3. Moderate cerebral atrophy and chronic small vessel ischemic disease. 4. Negative head MRA. Continued patency of the revascularized right MCA.   Dg Chest Port 1 View Jul 05, 2018 IMPRESSION:  No active disease.  Endotracheal tube tip 4.8 cm above the carina.    TTE - Technically difficult study. LVEF 65-70%, moderate LVH, normal   wall motion, mild aortic stenosis - mean gradient of 12 mmHg -   AVA around 1.7 cm2, trace to mild MR, normal LA size, dilated   IVC.   LE venous doppler Right: There is no evidence of deep vein thrombosis in the lower extremity. However, portions of this examination were limited- see technologist comments above. Left: There is no evidence of deep vein thrombosis in the lower extremity. However, portions of this examination were limited- see technologist comments above.     PHYSICAL EXAM  Temp:  [98.7 F (37.1 C)] 98.7 F (37.1 C) (10/03 0543) Pulse Rate:  [40-69] 40 (10/03 0543) Resp:  [10-13] 10 (10/03 0543) BP: (117)/(63) 117/63 (10/03 0543) SpO2:  [92 %-99 %] 92 % (10/03 0543)  General - Frail elderly Caucasian male not in distress. Ophthalmologic - fundi not visualized due to noncooperation.  Cardiovascular - Regular rate and rhythm.  Skin - b/l LEs foreleg b/l ulcers  and wounds, with red/purple discoloration, on dressing.   Neuro - Eyes closed, does not respond to sternal rub. not tracking and not following commands. PERRL, eye mid position, doll's eyes positive. Corneal weak on the left but positive on the right. Positive gag and cough. Facial symmetry not able to test due to ET tube. On pain stimulation, slight withdraw on UEs but 2+/5 LLE and 2/5 RLE. LUE increased tone with decorticate posturing, RUE flaccid with decreased tone. DTR diminished and no babinski. Sensation, coordination and gait not tested.   ASSESSMENT/PLAN Mr. KIMBERLEY DASTRUP is a 84 y.o. male with history of DVT on Xarelto but stopped recently after LE cellulitis, BPH on foley placed recently in [facility] admitted for aphasia, left facial droop, left weakness and neglect.  tPA given and mechanical thrombectomy  Stroke:  right large MCA infarct mainly at cortex and BG due to right M1 occlusion s/p tPA and IR with TICI3 reperfusion, embolic pattern, source unclear  Resultant intubated and left sided weakness and right gaze preference  CT head no acute finding  CTA head and neck - right M1 occlusion  DSA - right M1 occlusion s/p TICI3 reperfusion  MRI  right large MCA infarct mainly at cortex and BG   MRA unremarkable after IR  2D Echo EF 65-70%  LDL 61  HgbA1c 6.2  Heparin subq for VTE prophylaxis due to b/l LE ulcers and ICH  No antithrombotic prior to admission, now on No antithrombotic   Ongoing aggressive stroke risk factor management  Therapy recommendations:  Pending   Disposition:  Pending   Per Dr Roda Shutters - Updated wife over the phone, wife will try to come in but can not promise due to her own medical issue. Wife would like to continue current management for the next 2-3 days, if no improvement, she would like to talk to palliative care about comfort care as pt had clear statement that he would not want to be kept alive without QOL. I also discussed with son over the phone  and updated the same.  Code status DNR  ICH with IVH likely due to tPA s/p reversal  tPA reversed with TXA and cryo  CT post procedure showed b/l frontal ICH, left BG and temporal ICH with IVH and SAH  Repeat CT stable with left frontal/BG ICH decompressed to lateral ventricle  MRI confirmed ICH, IVH and SAH, no hydrocephalus. No significant CAA either.   Fibrinogen 370->446  Repeat CT 06/11/18 stable hematoma, infarct and ventricle size, blood product less than before  LE venous stasis with ulcers and cellulitis and hx of DVT  Wound care on board  Has hx of DVT was on Xarelto at home but it was discontinued on recent discharge on 06/02/18.   LE venous doppler no DVT this admission  Off rocephin   Blood culture NGTD (5 days)  Leukocytosis and fever  Spike fever last night - Tmax 101  Blood culture NGTD  UA WBC 6-10  Urine culture NGTD  WBC 14->12.1->12.7->11.3->11.0->10.5  CXR 06/10/18 likely multifocal pneumonia  CCM on board  Now off rocephin (started 05/19/2018)   Short run of V-tach and transient cardiac arrest  Pt also had brief cardiac arrest during IR procedure and under anesthesia - reversed on its own  Captured on tele 06/10/18. Resolved on its own  Close monitoring  Hypertension/hypotension . Stable  BP goal < 160  Off cleviprex  Off neo  Labetalol PRN  Other Stroke Risk Factors  Advanced age  Other Active Problems  BPH - on foley catheter    Hospital day # 10  The patient has not done well and has a poor prognosis and family has made decision to make him comfort care and agree with the plan. Continue morphine drip. Transfer to hospice nursing home over the next few days.I spoke to the patient's son over the phone who agreed with the plan.. Greater than 50% time during this 25 minute visit was spent on coordination of care about patient's condition and discussion with  Care team Delia Heady, MD Medical Director [facility] Stroke  Center Pager: [phone] 06/17/2018 6:26 PM To contact Stroke Continuity provider, please refer to [email or web address]. After hours, contact General Neurology

## 2018-06-17 NOTE — Plan of Care (Signed)

## 2018-06-17 NOTE — Social Work (Signed)
CSW acknowledging consult for comfort care. Should pt be stable for inpatient hospice home transport please let CSW know. Otherwise will follow for disposition as needed.  Doy Hutching, LCSWA [facility] Clinical Social Work [phone]

## 2018-06-18 NOTE — Progress Notes (Addendum)
[facility]) hospital liaison note.  Received request from Marisue Ivan, CSW for family interest in [facility]. Chart reviewed and eligibility confirmed. CSW notified that patient can transfer on 05/20/2018 after 10:00am. Consents will need to be completed prior to transfer.  Hospital liaison contacted sonMat Carne to confirm interest and schedule time to complete paperwork. Consents will be emailed from [facility] and hospital liaison will follow up in am to confirm paperwork and patient stability to transfer.   Please call with any hospice related questions or concerns.  Thank you,  Elsie Saas, RN, [facility]  [facility] Liaison  [phone]  [facility] are on AMION

## 2018-06-18 NOTE — Care Management Note (Signed)
Case Management Note  Patient Details  Name: Justin [name] MRN: [id number] Date of Birth: 06-27-1928  Subjective/Objective:                    Action/Plan:  Consult for Hospice nursing home  Placed order for social work  Expected Discharge Date:                  Expected Discharge Plan:  Hospice Medical Facility  In-House Referral:  Clinical Social Work  Discharge planning Services  CM Consult  Post Acute Care Choice:  Hospice Choice offered to:     DME Arranged:    DME Agency:     HH Arranged:    HH Agency:     Status of Service:  In process, will continue to follow  If discussed at Long Length of Stay Meetings, dates discussed:    Additional Comments:  Kingsley Plan, RN 06/18/2018, 8:58 AM

## 2018-06-18 NOTE — Progress Notes (Addendum)
CSW acknowledging consult for placement at residential hospice. Patient is a recent readmit, last full assessment completed on 05/31/18 (see below).   Per chart review, patient is on comfort care and plan to evaluate patient's ability to transition to residential hospice. CSW contacted patient's son [name] to discuss via phone. [name] acknowledged the reality of the situation, that they are just waiting for his dad to die and it doesn't really matter where that happens; if it is more cost effective to transfer to hospice and open a hospital bed for someone else who needs it more, than [name] would like to pursue transfer as soon as possible. [name] requested to be main point of contact, as his mom is struggling and overwhelmed, she would prefer not to be contacted with the administrative aspects of what's occurring to the patient at this time.   CSW sent referral to [place] [facility] and confirmed availability for tomorrow, pending patient's ability to safety transfer via ambulance.  CSW to follow.  [name], [place] Clinical Social Worker [phone]     Clinical Social Work Assessment  Patient Details  Name: Justin [name] MRN: [id number] Date of Birth: August 13, 1928  Date of referral:  05/31/18               Reason for consult:  Discharge Planning, Facility Placement                    Permission sought to share information with:    Permission granted to share information::                Name::                   Agency::                Relationship::                Contact Information:     Housing/Transportation Living arrangements for the past 2 months:  Single Family Home Source of Information:  Spouse Patient Interpreter Needed:  None Criminal Activity/Legal Involvement Pertinent to Current Situation/Hospitalization:    Significant Relationships:  Spouse Lives with:  Self, Spouse Do you feel safe going back to the place where you live?  No Need for family  participation in patient care:  Yes (Comment)  Care giving concerns:  Patients spouse, [name], does not believe patient will be agreeable to go to SNF at time of discharge- patient not oriented.    Social Worker assessment / plan:  CSW spoke with patients spouse, [name], regarding disposition plans. PT is currently recommending SNF placement at time of discharge. Spouse thinks this would be best for patient to regain his strength before returning home. Spouse does not think patient will be agreeable to this plan and is unsure if she can convince him to go. Spouse is agreeable to SNF placement at this time(if spouse agrees) and is agreeable for patients information to be faxed out. Patient/ spouse live off of [employer] and would prefer facility close by. CSW will complete all needed information for SNF placement.   Employment status:  Retired Health and safety inspector:  Medicare PT Recommendations:  Skilled Nursing Facility Information / Referral to community resources:  Skilled Nursing Facility  Patient/Family's Response to care:  Patients spouse appreciated CSW.   Patient/Family's Understanding of and Emotional Response to Diagnosis, Current Treatment, and Prognosis:  Spouse understands current disposition plans.   Emotional Assessment Appearance:    Attitude/Demeanor/Rapport:  Affect (typically observed):  Unable to Assess Orientation:    Alcohol / Substance use:    Psych involvement (Current and /or in the community):  No (Comment)  Discharge Needs  Concerns to be addressed:  No discharge needs identified Readmission within the last 30 days:  No Current discharge risk:  None Barriers to Discharge:  No SNF bed   [name], LCSW 05/31/2018, 10:42 AM

## 2018-06-18 NOTE — Progress Notes (Signed)
STROKE TEAM PROGRESS NOTE   SUBJECTIVE (INTERVAL HISTORY) No family is at the bedside. Pt remains on morphine drip now at 65ml.hr and appears to be resting comfortably. OBJECTIVE Temp:  [97.8 F (36.6 C)] 97.8 F (36.6 C) (10/04 0623) Pulse Rate:  [77] 77 (10/04 0623) Resp:  [12] 12 (10/04 0623) BP: (115)/(62) 115/62 (10/04 0623) SpO2:  [82 %-92 %] 92 % (10/04 0648)  Recent Labs  Lab 06/11/18 1634  GLUCAP 136*   Recent Labs  Lab 06/12/18 0234 06/13/18 0331 06/14/18 0303  NA 135 132* 131*  K 3.8 4.1 4.4  CL 100 99 98  CO2 25 24 25   GLUCOSE 126* 133* 115*  BUN 16 21 19   CREATININE 0.57* 0.51* 0.55*  CALCIUM 8.3* 8.1* 8.4*   No results for input(s): AST, ALT, ALKPHOS, BILITOT, PROT, ALBUMIN in the last 168 hours. Recent Labs  Lab 06/12/18 0234 06/13/18 0331 06/14/18 0303  WBC 9.6 10.6* 10.5  HGB 10.6* 10.9* 11.2*  HCT 32.1* 32.3* 33.2*  MCV 95.0 93.9 93.8  PLT 406* 413* 423*   No results for input(s): CKTOTAL, CKMB, CKMBINDEX, TROPONINI in the last 168 hours. No results for input(s): LABPROT, INR in the last 72 hours. No results for input(s): COLORURINE, LABSPEC, PHURINE, GLUCOSEU, HGBUR, BILIRUBINUR, KETONESUR, PROTEINUR, UROBILINOGEN, NITRITE, LEUKOCYTESUR in the last 72 hours.  Invalid input(s): APPERANCEUR     Component Value Date/Time   CHOL 103 06/08/2018 0435   TRIG 90 06/08/2018 0435   HDL 24 (L) 06/08/2018 0435   CHOLHDL 4.3 06/08/2018 0435   VLDL 18 06/08/2018 0435   LDLCALC 61 06/08/2018 0435   Lab Results  Component Value Date   HGBA1C 6.2 (H) 06/08/2018      Component Value Date/Time   LABOPIA NONE DETECTED 09/26/2012 0034   COCAINSCRNUR NONE DETECTED 09/26/2012 0034   LABBENZ NONE DETECTED 09/26/2012 0034   AMPHETMU NONE DETECTED 09/26/2012 0034   THCU NONE DETECTED 09/26/2012 0034   LABBARB NONE DETECTED 09/26/2012 0034    No results for input(s): ETH in the last 168 hours.   IMAGING  Ct Angio Head W Or Wo Contrast Ct Angio Neck  W Or Wo Contrast 07-03-18 IMPRESSION:  1. Emergent large vessel occlusion of the right middle cerebral artery M1 segment with minimal collateral flow demonstrated in the right MCA territory.  2. Biapical pulmonary opacities most consistent with fibrosis or scarring. A superimposed mass or consolidation would be difficult to exclude. If prior chest CTs exists, comparison would be helpful.  3. No arterial occlusion or hemodynamically significant stenosis of the major arteries of the neck.    Ct Head Wo Contrast 05/28/2018 IMPRESSION:  1.  No acute intracranial abnormality.  2. Atrophy with moderate to advanced chronic small vessel ischemia.    Ct Head Wo Contrast Jul 03, 2018 IMPRESSION:  1. Multiple new intraparenchymal hematomas with hematocrit levels consistent with acute and suspected active hemorrhage.  2. LEFT lateral intraventricular hemorrhage, possible parenchymal extension.  3. Scattered extra-axial blood products, confounded by postcontrast imaging.   Ct Head Code Stroke Wo Contrast 2018/07/03 IMPRESSION:  1. No acute hemorrhage or mass lesion.  2. ASPECTS is 10.  3. 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    Ct Head Wo Contrast 06/08/2018 IMPRESSION:  1. Interval decompression of left paramedian frontal lobe brain parenchymal hemorrhage into the ventricular system with increased blood products in both lateral ventricular atria. Stable ventricle size.  2. Otherwise stable brain parenchymal hemorrhage in paramedian frontal, parietal,  and occipital lobes as well as the left temporal lobe.  3. Stable moderate volume of subarachnoid hemorrhage over convexities and cerebellum.  4. No new acute intracranial abnormality identified.    Ct Head Wo Contrast 06/11/2018 IMPRESSION:  1. Interval decrease in size of multifocal intraparenchymal cerebral hemorrhages, with decreased volume of subarachnoid and intraventricular hemorrhage. Stable  ventricular size.  2. Continued interval evolution of large right MCA territory infarct, stable in size and distribution relative to recent MRI. No evidence for hemorrhagic transformation or significant mass effect.  3. No other new acute intracranial abnormality.    MRI and MRA 06/08/2018 1. Acute large right MCA infarct primarily involving cortex and basal ganglia. 2. Bilateral parenchymal hemorrhages, subarachnoid hemorrhage, and intraventricular hemorrhage, not significantly changed from today's earlier CT. 3. Moderate cerebral atrophy and chronic small vessel ischemic disease. 4. Negative head MRA. Continued patency of the revascularized right MCA.   Dg Chest Port 1 View 06/11/18 IMPRESSION:  No active disease.  Endotracheal tube tip 4.8 cm above the carina.    TTE - Technically difficult study. LVEF 65-70%, moderate LVH, normal   wall motion, mild aortic stenosis - mean gradient of 12 mmHg -   AVA around 1.7 cm2, trace to mild MR, normal LA size, dilated   IVC.   LE venous doppler Right: There is no evidence of deep vein thrombosis in the lower extremity. However, portions of this examination were limited- see technologist comments above. Left: There is no evidence of deep vein thrombosis in the lower extremity. However, portions of this examination were limited- see technologist comments above.     PHYSICAL EXAM  Temp:  [97.8 F (36.6 C)] 97.8 F (36.6 C) (10/04 0623) Pulse Rate:  [77] 77 (10/04 0623) Resp:  [12] 12 (10/04 0623) BP: (115)/(62) 115/62 (10/04 0623) SpO2:  [82 %-92 %] 92 % (10/04 0648)  General - Frail elderly Caucasian male not in distress. Ophthalmologic - fundi not visualized due to noncooperation.  Cardiovascular - Regular rate and rhythm.  Skin - b/l LEs foreleg b/l ulcers and wounds, with red/purple discoloration, on dressing.   Neuro - Eyes closed, does not respond to sternal rub. not tracking and not following commands. PERRL, eye  mid position, doll's eyes positive. Corneal weak on the left but positive on the right. Positive gag and cough. Facial symmetry not able to test due to ET tube. On pain stimulation, slight withdraw on UEs but 2+/5 LLE and 2/5 RLE. LUE increased tone with decorticate posturing, RUE flaccid with decreased tone. DTR diminished and no babinski. Sensation, coordination and gait not tested.   ASSESSMENT/PLAN Justin [name] is a 84 y.o. male with history of DVT on Xarelto but stopped recently after LE cellulitis, BPH on foley placed recently in [facility] admitted for aphasia, left facial droop, left weakness and neglect.  tPA given and mechanical thrombectomy  Stroke:  right large MCA infarct mainly at cortex and BG due to right M1 occlusion s/p tPA and IR with TICI3 reperfusion, embolic pattern, source unclear  Resultant intubated and left sided weakness and right gaze preference  CT head no acute finding  CTA head and neck - right M1 occlusion  DSA - right M1 occlusion s/p TICI3 reperfusion  MRI  right large MCA infarct mainly at cortex and BG   MRA unremarkable after IR  2D Echo EF 65-70%  LDL 61  HgbA1c 6.2  Heparin subq for VTE prophylaxis due to b/l LE ulcers and ICH  No antithrombotic  prior to admission, now on No antithrombotic   Ongoing aggressive stroke risk factor management  Therapy recommendations:  Pending   Disposition:  Pending   Per Dr Roda Shutters - Updated wife over the phone, wife will try to come in but can not promise due to her own medical issue. Wife would like to continue current management for the next 2-3 days, if no improvement, she would like to talk to palliative care about comfort care as pt had clear statement that he would not want to be kept alive without QOL. I also discussed with son over the phone and updated the same.  Code status DNR  ICH with IVH likely due to tPA s/p reversal  tPA reversed with TXA and cryo  CT post procedure showed b/l frontal ICH,  left BG and temporal ICH with IVH and SAH  Repeat CT stable with left frontal/BG ICH decompressed to lateral ventricle  MRI confirmed ICH, IVH and SAH, no hydrocephalus. No significant CAA either.   Fibrinogen 370->446  Repeat CT 06/11/18 stable hematoma, infarct and ventricle size, blood product less than before  LE venous stasis with ulcers and cellulitis and hx of DVT  Wound care on board  Has hx of DVT was on Xarelto at home but it was discontinued on recent discharge on 06/02/18.   LE venous doppler no DVT this admission  Off rocephin   Blood culture NGTD (5 days)  Leukocytosis and fever  Spike fever last night - Tmax 101  Blood culture NGTD  UA WBC 6-10  Urine culture NGTD  WBC 14->12.1->12.7->11.3->11.0->10.5  CXR 06/10/18 likely multifocal pneumonia  CCM on board  Now off rocephin (started 07-04-18)   Short run of V-tach and transient cardiac arrest  Pt also had brief cardiac arrest during IR procedure and under anesthesia - reversed on its own  Captured on tele 06/10/18. Resolved on its own  Close monitoring  Hypertension/hypotension . Stable  BP goal < 160  Off cleviprex  Off neo  Labetalol PRN  Other Stroke Risk Factors  Advanced age  Other Active Problems  BPH - on foley catheter    Hospital day # 11  The patient has not done well and has a poor prognosis and family has made decision to make him comfort care and agree with the plan. Continue morphine drip. Transfer to hospice nursing home over the next few days when bed available  Justin Heady, MD Medical Director [facility] Pager: [phone] 06/18/2018 1:16 PM To contact Stroke Continuity provider, please refer to [email or web address]. After hours, contact General Neurology

## 2018-06-19 NOTE — Progress Notes (Signed)
STROKE TEAM PROGRESS NOTE   SUBJECTIVE (INTERVAL HISTORY) No family is at the bedside. Pt remains on morphine drip now at 40ml.hr and appears to be resting comfortably. OBJECTIVE Temp:  [99.9 F (37.7 C)] 99.9 F (37.7 C) (10/05 0518) Pulse Rate:  [79] 79 (10/05 0518) Resp:  [14] 14 (10/05 0518) BP: (132)/(66) 132/66 (10/05 0518) SpO2:  [95 %] 95 % (10/05 0518)  No results for input(s): GLUCAP in the last 168 hours. Recent Labs  Lab 06/13/18 0331 06/14/18 0303  NA 132* 131*  K 4.1 4.4  CL 99 98  CO2 24 25  GLUCOSE 133* 115*  BUN 21 19  CREATININE 0.51* 0.55*  CALCIUM 8.1* 8.4*   No results for input(s): AST, ALT, ALKPHOS, BILITOT, PROT, ALBUMIN in the last 168 hours. Recent Labs  Lab 06/13/18 0331 06/14/18 0303  WBC 10.6* 10.5  HGB 10.9* 11.2*  HCT 32.3* 33.2*  MCV 93.9 93.8  PLT 413* 423*   No results for input(s): CKTOTAL, CKMB, CKMBINDEX, TROPONINI in the last 168 hours. No results for input(s): LABPROT, INR in the last 72 hours. No results for input(s): COLORURINE, LABSPEC, PHURINE, GLUCOSEU, HGBUR, BILIRUBINUR, KETONESUR, PROTEINUR, UROBILINOGEN, NITRITE, LEUKOCYTESUR in the last 72 hours.  Invalid input(s): APPERANCEUR     Component Value Date/Time   CHOL 103 06/08/2018 0435   TRIG 90 06/08/2018 0435   HDL 24 (L) 06/08/2018 0435   CHOLHDL 4.3 06/08/2018 0435   VLDL 18 06/08/2018 0435   LDLCALC 61 06/08/2018 0435   Lab Results  Component Value Date   HGBA1C 6.2 (H) 06/08/2018      Component Value Date/Time   LABOPIA NONE DETECTED 09/26/2012 0034   COCAINSCRNUR NONE DETECTED 09/26/2012 0034   LABBENZ NONE DETECTED 09/26/2012 0034   AMPHETMU NONE DETECTED 09/26/2012 0034   THCU NONE DETECTED 09/26/2012 0034   LABBARB NONE DETECTED 09/26/2012 0034    No results for input(s): ETH in the last 168 hours.   IMAGING  Ct Angio Head W Or Wo Contrast Ct Angio Neck W Or Wo Contrast 06/05/2018 IMPRESSION:  1. Emergent large vessel occlusion of the right  middle cerebral artery M1 segment with minimal collateral flow demonstrated in the right MCA territory.  2. Biapical pulmonary opacities most consistent with fibrosis or scarring. A superimposed mass or consolidation would be difficult to exclude. If prior chest CTs exists, comparison would be helpful.  3. No arterial occlusion or hemodynamically significant stenosis of the major arteries of the neck.    Ct Head Wo Contrast 05/28/2018 IMPRESSION:  1.  No acute intracranial abnormality.  2. Atrophy with moderate to advanced chronic small vessel ischemia.    Ct Head Wo Contrast 06/10/2018 IMPRESSION:  1. Multiple new intraparenchymal hematomas with hematocrit levels consistent with acute and suspected active hemorrhage.  2. LEFT lateral intraventricular hemorrhage, possible parenchymal extension.  3. Scattered extra-axial blood products, confounded by postcontrast imaging.   Ct Head Code Stroke Wo Contrast 05/23/2018 IMPRESSION:  1. No acute hemorrhage or mass lesion.  2. ASPECTS is 10.  3. 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    Ct Head Wo Contrast 06/08/2018 IMPRESSION:  1. Interval decompression of left paramedian frontal lobe brain parenchymal hemorrhage into the ventricular system with increased blood products in both lateral ventricular atria. Stable ventricle size.  2. Otherwise stable brain parenchymal hemorrhage in paramedian frontal, parietal, and occipital lobes as well as the left temporal lobe.  3. Stable moderate volume of subarachnoid hemorrhage over  convexities and cerebellum.  4. No new acute intracranial abnormality identified.    Ct Head Wo Contrast 06/11/2018 IMPRESSION:  1. Interval decrease in size of multifocal intraparenchymal cerebral hemorrhages, with decreased volume of subarachnoid and intraventricular hemorrhage. Stable ventricular size.  2. Continued interval evolution of large right MCA territory infarct,  stable in size and distribution relative to recent MRI. No evidence for hemorrhagic transformation or significant mass effect.  3. No other new acute intracranial abnormality.    MRI and MRA 06/08/2018 1. Acute large right MCA infarct primarily involving cortex and basal ganglia. 2. Bilateral parenchymal hemorrhages, subarachnoid hemorrhage, and intraventricular hemorrhage, not significantly changed from today's earlier CT. 3. Moderate cerebral atrophy and chronic small vessel ischemic disease. 4. Negative head MRA. Continued patency of the revascularized right MCA.   Dg Chest Port 1 View 05/18/2018 IMPRESSION:  No active disease.  Endotracheal tube tip 4.8 cm above the carina.    TTE - Technically difficult study. LVEF 65-70%, moderate LVH, normal   wall motion, mild aortic stenosis - mean gradient of 12 mmHg -   AVA around 1.7 cm2, trace to mild MR, normal LA size, dilated   IVC.   LE venous doppler Right: There is no evidence of deep vein thrombosis in the lower extremity. However, portions of this examination were limited- see technologist comments above. Left: There is no evidence of deep vein thrombosis in the lower extremity. However, portions of this examination were limited- see technologist comments above.     PHYSICAL EXAM  Temp:  [99.9 F (37.7 C)] 99.9 F (37.7 C) (10/05 0518) Pulse Rate:  [79] 79 (10/05 0518) Resp:  [14] 14 (10/05 0518) BP: (132)/(66) 132/66 (10/05 0518) SpO2:  [95 %] 95 % (10/05 0518)  General - Frail elderly Caucasian male not in distress. Ophthalmologic - fundi not visualized due to noncooperation.  Cardiovascular - Regular rate and rhythm.  Skin - b/l LEs foreleg b/l ulcers and wounds, with red/purple discoloration, on dressing.   Neuro - Eyes closed, does not respond to sternal rub. not tracking and not following commands. PERRL, eye mid position, doll's eyes positive. Corneal weak on the left but positive on the right. Positive  gag and cough. Facial symmetry not able to test due to ET tube. On pain stimulation, slight withdraw on UEs but 2+/5 LLE and 2/5 RLE. LUE increased tone with decorticate posturing, RUE flaccid with decreased tone. DTR diminished and no babinski. Sensation, coordination and gait not tested.   ASSESSMENT/PLAN Justin [name] is a 84 y.o. male with history of DVT on Xarelto but stopped recently after LE cellulitis, BPH on foley placed recently in [facility] admitted for aphasia, left facial droop, left weakness and neglect.  tPA given and mechanical thrombectomy  Stroke:  right large MCA infarct mainly at cortex and BG due to right M1 occlusion s/p tPA and IR with TICI3 reperfusion, embolic pattern, source unclear  Resultant intubated and left sided weakness and right gaze preference  CT head no acute finding  CTA head and neck - right M1 occlusion  DSA - right M1 occlusion s/p TICI3 reperfusion  MRI  right large MCA infarct mainly at cortex and BG   MRA unremarkable after IR  2D Echo EF 65-70%  LDL 61  HgbA1c 6.2  Heparin subq for VTE prophylaxis due to b/l LE ulcers and ICH  No antithrombotic prior to admission, now on No antithrombotic   Ongoing aggressive stroke risk factor management  Therapy recommendations:  Pending  Disposition:  Pending   Per Dr Roda Shutters - Updated wife over the phone, wife will try to come in but can not promise due to her own medical issue. Wife would like to continue current management for the next 2-3 days, if no improvement, she would like to talk to palliative care about comfort care as pt had clear statement that he would not want to be kept alive without QOL. I also discussed with son over the phone and updated the same.  Code status DNR  ICH with IVH likely due to tPA s/p reversal  tPA reversed with TXA and cryo  CT post procedure showed b/l frontal ICH, left BG and temporal ICH with IVH and SAH  Repeat CT stable with left frontal/BG ICH  decompressed to lateral ventricle  MRI confirmed ICH, IVH and SAH, no hydrocephalus. No significant CAA either.   Fibrinogen 370->446  Repeat CT 06/11/18 stable hematoma, infarct and ventricle size, blood product less than before  LE venous stasis with ulcers and cellulitis and hx of DVT  Wound care on board  Has hx of DVT was on Xarelto at home but it was discontinued on recent discharge on 06/02/18.   LE venous doppler no DVT this admission  Off rocephin   Blood culture NGTD (5 days)  Leukocytosis and fever  Spike fever last night - Tmax 101  Blood culture NGTD  UA WBC 6-10  Urine culture NGTD  WBC 14->12.1->12.7->11.3->11.0->10.5  CXR 06/10/18 likely multifocal pneumonia  CCM on board  Now off rocephin (started 06/10/2018)   Short run of V-tach and transient cardiac arrest  Pt also had brief cardiac arrest during IR procedure and under anesthesia - reversed on its own  Captured on tele 06/10/18. Resolved on its own  Close monitoring  Hypertension/hypotension . Stable  BP goal < 160  Off cleviprex  Off neo  Labetalol PRN  Other Stroke Risk Factors  Advanced age  Other Active Problems  BPH - on foley catheter   As per Hospice liaison who spoke with son today, he does not want to move his son at this time.  Will continue comfort measures with morphine drip    Hospital day # 12  Unfortunately the patient has not done well and has a poor prognosis. The family has made the decision to transition him to comfort care only and neurology agrees with the plan. Continue morphine drip. Transfer to hospice nursing center when a bed is available. Discharge summary started in anticipation of transfer but has not yet been completed.  To contact Stroke Continuity provider, please refer to [email or web address]. After hours, contact General Neurology

## 2018-06-19 NOTE — Progress Notes (Signed)
[facility]) hospital liaison note.  [facility] hospital liaison spoke with patient's son this morning and the son states he does not want to move his father at this time. Paperwork has not been completed. CSW notified.  Please call with any hospice related questions or concerns.  Thank you,  Elsie Saas, RN, [facility]  [facility] Liaison  [phone]  [facility] are on [facility]

## 2018-06-20 NOTE — Progress Notes (Signed)
STROKE TEAM PROGRESS NOTE   SUBJECTIVE (INTERVAL HISTORY) No family is at the bedside. Pt remains on morphine drip now at 91ml.hr and appears to be resting comfortably. OBJECTIVE Temp:  [97.6 F (36.4 C)] 97.6 F (36.4 C) (10/06 0501) Pulse Rate:  [67] 67 (10/06 0501) Resp:  [14] 14 (10/06 0501) BP: (130)/(61) 130/61 (10/06 0501) SpO2:  [100 %] 100 % (10/06 0501)  No results for input(s): GLUCAP in the last 168 hours. Recent Labs  Lab 06/14/18 0303  NA 131*  K 4.4  CL 98  CO2 25  GLUCOSE 115*  BUN 19  CREATININE 0.55*  CALCIUM 8.4*   No results for input(s): AST, ALT, ALKPHOS, BILITOT, PROT, ALBUMIN in the last 168 hours. Recent Labs  Lab 06/14/18 0303  WBC 10.5  HGB 11.2*  HCT 33.2*  MCV 93.8  PLT 423*   No results for input(s): CKTOTAL, CKMB, CKMBINDEX, TROPONINI in the last 168 hours. No results for input(s): LABPROT, INR in the last 72 hours. No results for input(s): COLORURINE, LABSPEC, PHURINE, GLUCOSEU, HGBUR, BILIRUBINUR, KETONESUR, PROTEINUR, UROBILINOGEN, NITRITE, LEUKOCYTESUR in the last 72 hours.  Invalid input(s): APPERANCEUR     Component Value Date/Time   CHOL 103 06/08/2018 0435   TRIG 90 06/08/2018 0435   HDL 24 (L) 06/08/2018 0435   CHOLHDL 4.3 06/08/2018 0435   VLDL 18 06/08/2018 0435   LDLCALC 61 06/08/2018 0435   Lab Results  Component Value Date   HGBA1C 6.2 (H) 06/08/2018      Component Value Date/Time   LABOPIA NONE DETECTED 09/26/2012 0034   COCAINSCRNUR NONE DETECTED 09/26/2012 0034   LABBENZ NONE DETECTED 09/26/2012 0034   AMPHETMU NONE DETECTED 09/26/2012 0034   THCU NONE DETECTED 09/26/2012 0034   LABBARB NONE DETECTED 09/26/2012 0034    No results for input(s): ETH in the last 168 hours.   IMAGING  Ct Angio Head W Or Wo Contrast Ct Angio Neck W Or Wo Contrast 06-12-18 IMPRESSION:  1. Emergent large vessel occlusion of the right middle cerebral artery M1 segment with minimal collateral flow demonstrated in the right  MCA territory.  2. Biapical pulmonary opacities most consistent with fibrosis or scarring. A superimposed mass or consolidation would be difficult to exclude. If prior chest CTs exists, comparison would be helpful.  3. No arterial occlusion or hemodynamically significant stenosis of the major arteries of the neck.    Ct Head Wo Contrast 05/28/2018 IMPRESSION:  1.  No acute intracranial abnormality.  2. Atrophy with moderate to advanced chronic small vessel ischemia.    Ct Head Wo Contrast 12-Jun-2018 IMPRESSION:  1. Multiple new intraparenchymal hematomas with hematocrit levels consistent with acute and suspected active hemorrhage.  2. LEFT lateral intraventricular hemorrhage, possible parenchymal extension.  3. Scattered extra-axial blood products, confounded by postcontrast imaging.   Ct Head Code Stroke Wo Contrast 12-Jun-2018 IMPRESSION:  1. No acute hemorrhage or mass lesion.  2. ASPECTS is 10.  3. 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    Ct Head Wo Contrast 06/08/2018 IMPRESSION:  1. Interval decompression of left paramedian frontal lobe brain parenchymal hemorrhage into the ventricular system with increased blood products in both lateral ventricular atria. Stable ventricle size.  2. Otherwise stable brain parenchymal hemorrhage in paramedian frontal, parietal, and occipital lobes as well as the left temporal lobe.  3. Stable moderate volume of subarachnoid hemorrhage over convexities and cerebellum.  4. No new acute intracranial abnormality identified.    Ct Head Wo  Contrast 06/11/2018 IMPRESSION:  1. Interval decrease in size of multifocal intraparenchymal cerebral hemorrhages, with decreased volume of subarachnoid and intraventricular hemorrhage. Stable ventricular size.  2. Continued interval evolution of large right MCA territory infarct, stable in size and distribution relative to recent MRI. No evidence for hemorrhagic  transformation or significant mass effect.  3. No other new acute intracranial abnormality.    MRI and MRA 06/08/2018 1. Acute large right MCA infarct primarily involving cortex and basal ganglia. 2. Bilateral parenchymal hemorrhages, subarachnoid hemorrhage, and intraventricular hemorrhage, not significantly changed from today's earlier CT. 3. Moderate cerebral atrophy and chronic small vessel ischemic disease. 4. Negative head MRA. Continued patency of the revascularized right MCA.   Dg Chest Port 1 View 06-26-18 IMPRESSION:  No active disease.  Endotracheal tube tip 4.8 cm above the carina.    TTE - Technically difficult study. LVEF 65-70%, moderate LVH, normal   wall motion, mild aortic stenosis - mean gradient of 12 mmHg -   AVA around 1.7 cm2, trace to mild MR, normal LA size, dilated   IVC.   LE venous doppler Right: There is no evidence of deep vein thrombosis in the lower extremity. However, portions of this examination were limited- see technologist comments above. Left: There is no evidence of deep vein thrombosis in the lower extremity. However, portions of this examination were limited- see technologist comments above.     PHYSICAL EXAM  Temp:  [97.6 F (36.4 C)] 97.6 F (36.4 C) (10/06 0501) Pulse Rate:  [67] 67 (10/06 0501) Resp:  [14] 14 (10/06 0501) BP: (130)/(61) 130/61 (10/06 0501) SpO2:  [100 %] 100 % (10/06 0501)  General - Frail elderly Caucasian male not in distress. Ophthalmologic - fundi not visualized due to noncooperation.  Cardiovascular - Regular rate and rhythm.  Skin - b/l LEs foreleg b/l ulcers and wounds, with red/purple discoloration, on dressing.   Neuro - Eyes closed, does not respond to sternal rub. not tracking and not following commands. PERRL, eye mid position, doll's eyes positive. Corneal weak on the left but positive on the right. Positive gag and cough. Facial symmetry not able to test due to ET tube. On pain  stimulation, slight withdraw on UEs but 2+/5 LLE and 2/5 RLE. LUE increased tone with decorticate posturing, RUE flaccid with decreased tone. DTR diminished and no babinski. Sensation, coordination and gait not tested.   ASSESSMENT/PLAN Mr. Justin [name] is a 84 y.o. male with history of DVT on Xarelto but stopped recently after LE cellulitis, BPH on foley placed recently in [facility] admitted for aphasia, left facial droop, left weakness and neglect.  tPA given and mechanical thrombectomy  Stroke:  right large MCA infarct mainly at cortex and BG due to right M1 occlusion s/p tPA and IR with TICI3 reperfusion, embolic pattern, source unclear  Resultant intubated and left sided weakness and right gaze preference  CT head no acute finding  CTA head and neck - right M1 occlusion  DSA - right M1 occlusion s/p TICI3 reperfusion  MRI  right large MCA infarct mainly at cortex and BG   MRA unremarkable after IR  2D Echo EF 65-70%  LDL 61  HgbA1c 6.2  Heparin subq for VTE prophylaxis due to b/l LE ulcers and ICH  No antithrombotic prior to admission, now on No antithrombotic   Ongoing aggressive stroke risk factor management  Therapy recommendations:  Pending   Disposition:  Pending   Per Dr Roda Shutters - Updated wife over the phone, wife  will try to come in but can not promise due to her own medical issue. Wife would like to continue current management for the next 2-3 days, if no improvement, she would like to talk to palliative care about comfort care as pt had clear statement that he would not want to be kept alive without QOL. I also discussed with son over the phone and updated the same.  Code status DNR  ICH with IVH likely due to tPA s/p reversal  tPA reversed with TXA and cryo  CT post procedure showed b/l frontal ICH, left BG and temporal ICH with IVH and SAH  Repeat CT stable with left frontal/BG ICH decompressed to lateral ventricle  MRI confirmed ICH, IVH and SAH, no  hydrocephalus. No significant CAA either.   Fibrinogen 370->446  Repeat CT 06/11/18 stable hematoma, infarct and ventricle size, blood product less than before  LE venous stasis with ulcers and cellulitis and hx of DVT  Wound care on board  Has hx of DVT was on Xarelto at home but it was discontinued on recent discharge on 06/02/18.   LE venous doppler no DVT this admission  Off rocephin   Blood culture NGTD (5 days)  Leukocytosis and fever  Spike fever last night - Tmax 101  Blood culture NGTD  UA WBC 6-10  Urine culture NGTD  WBC 14->12.1->12.7->11.3->11.0->10.5  CXR 06/10/18 likely multifocal pneumonia  CCM on board  Now off rocephin (started 2018-06-19)   Short run of V-tach and transient cardiac arrest  Pt also had brief cardiac arrest during IR procedure and under anesthesia - reversed on its own  Captured on tele 06/10/18. Resolved on its own  Close monitoring  Hypertension/hypotension . Stable  BP goal < 160  Off cleviprex  Off neo  Labetalol PRN  Other Stroke Risk Factors  Advanced age  Other Active Problems  BPH - on foley catheter   As per Hospice liaison who spoke with son yesterday, he does not want to move his son at this time.  Will continue comfort measures with morphine drip    Hospital day # 13  Unfortunately the patient has not done well and has a poor prognosis. The family has made the decision to transition him to comfort care only and neurology agrees with the plan. Continue morphine drip. Discharge summary started in anticipation of transfer but has not yet been completed.  The patient's son has now changed his mind about inpatient hospice and would like his father to remain at [facility].  Discussed with Palliative Care MD. They will see pt and talk with family but it may not be until Monday.  To contact Stroke Continuity provider, please refer to [email or web address]. After hours, contact General Neurology

## 2018-06-21 ENCOUNTER — Other Ambulatory Visit: Payer: Self-pay

## 2018-06-21 DIAGNOSIS — J9600 Acute respiratory failure, unspecified whether with hypoxia or hypercapnia: Secondary | ICD-10-CM

## 2018-06-21 DIAGNOSIS — Z515 Encounter for palliative care: Secondary | ICD-10-CM

## 2018-06-21 DIAGNOSIS — I10 Essential (primary) hypertension: Secondary | ICD-10-CM

## 2018-06-21 DIAGNOSIS — Z7189 Other specified counseling: Secondary | ICD-10-CM

## 2018-06-21 DIAGNOSIS — Z86718 Personal history of other venous thrombosis and embolism: Secondary | ICD-10-CM

## 2018-06-21 MED ORDER — MORPHINE BOLUS VIA INFUSION
4.0000 mg | INTRAVENOUS | Status: DC | PRN
  Filled 2018-06-21: qty 4

## 2018-06-21 NOTE — Progress Notes (Signed)
STROKE TEAM PROGRESS NOTE   SUBJECTIVE (INTERVAL HISTORY) No family is at the bedside. Pt remains on morphine drip. He appears to be not in distress. Longer period apnea than before. Palliative care on board and not recommend residential hospice at this time   OBJECTIVE Temp:  [99.2 F (37.3 C)] 99.2 F (37.3 C) (10/07 0540) Pulse Rate:  [72] 72 (10/07 0540) Resp:  [6-14] 6 (10/07 0811) BP: (135)/(64) 135/64 (10/07 0540) SpO2:  [97 %] 97 % (10/07 0540)   IMAGING  Ct Angio Head W Or Wo Contrast Ct Angio Neck W Or Wo Contrast 2018-06-24 IMPRESSION:  1. Emergent large vessel occlusion of the right middle cerebral artery M1 segment with minimal collateral flow demonstrated in the right MCA territory.  2. Biapical pulmonary opacities most consistent with fibrosis or scarring. A superimposed mass or consolidation would be difficult to exclude. If prior chest CTs exists, comparison would be helpful.  3. No arterial occlusion or hemodynamically significant stenosis of the major arteries of the neck.    Ct Head Wo Contrast 05/28/2018 IMPRESSION:  1.  No acute intracranial abnormality.  2. Atrophy with moderate to advanced chronic small vessel ischemia.    Ct Head Wo Contrast 06-24-2018 IMPRESSION:  1. Multiple new intraparenchymal hematomas with hematocrit levels consistent with acute and suspected active hemorrhage.  2. LEFT lateral intraventricular hemorrhage, possible parenchymal extension.  3. Scattered extra-axial blood products, confounded by postcontrast imaging.   Ct Head Code Stroke Wo Contrast 06-24-2018 IMPRESSION:  1. No acute hemorrhage or mass lesion.  2. ASPECTS is 10.  3. 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    Ct Head Wo Contrast 06/08/2018 IMPRESSION:  1. Interval decompression of left paramedian frontal lobe brain parenchymal hemorrhage into the ventricular system with increased blood products in both lateral  ventricular atria. Stable ventricle size.  2. Otherwise stable brain parenchymal hemorrhage in paramedian frontal, parietal, and occipital lobes as well as the left temporal lobe.  3. Stable moderate volume of subarachnoid hemorrhage over convexities and cerebellum.  4. No new acute intracranial abnormality identified.    Ct Head Wo Contrast 06/11/2018 IMPRESSION:  1. Interval decrease in size of multifocal intraparenchymal cerebral hemorrhages, with decreased volume of subarachnoid and intraventricular hemorrhage. Stable ventricular size.  2. Continued interval evolution of large right MCA territory infarct, stable in size and distribution relative to recent MRI. No evidence for hemorrhagic transformation or significant mass effect.  3. No other new acute intracranial abnormality.    MRI and MRA 06/08/2018 1. Acute large right MCA infarct primarily involving cortex and basal ganglia. 2. Bilateral parenchymal hemorrhages, subarachnoid hemorrhage, and intraventricular hemorrhage, not significantly changed from today's earlier CT. 3. Moderate cerebral atrophy and chronic small vessel ischemic disease. 4. Negative head MRA. Continued patency of the revascularized right MCA.   Dg Chest Port 1 View 2018-06-24 IMPRESSION:  No active disease.  Endotracheal tube tip 4.8 cm above the carina.    TTE - Technically difficult study. LVEF 65-70%, moderate LVH, normal   wall motion, mild aortic stenosis - mean gradient of 12 mmHg -   AVA around 1.7 cm2, trace to mild MR, normal LA size, dilated   IVC.   LE venous doppler Right: There is no evidence of deep vein thrombosis in the lower extremity. However, portions of this examination were limited- see technologist comments above. Left: There is no evidence of deep vein thrombosis in the lower extremity. However, portions of this examination were limited-  see technologist comments above.     PHYSICAL EXAM  Temp:  [99.2 F (37.3 C)] 99.2  F (37.3 C) (10/07 0540) Pulse Rate:  [72] 72 (10/07 0540) Resp:  [6-14] 6 (10/07 0811) BP: (135)/(64) 135/64 (10/07 0540) SpO2:  [97 %] 97 % (10/07 0540)  General - Frail elderly Caucasian male not in distress.  Ophthalmologic - fundi not visualized due to comfort care.  Cardiovascular - Regular rate and rhythm.  Neuro - limited exam due to comfort care. Eyes closed, does not open on voice. Not responding to greetings or commands. Facial symmetric. Long period of apnea on morphine drip. No spontaneous movement, no distress. Sensation, coordination and gait not tested due to comfort care.   ASSESSMENT/PLAN Mr. Justin [name] is a 84 y.o. male with history of DVT on Xarelto but stopped recently after LE cellulitis, BPH on foley placed recently in [facility] admitted for aphasia, left facial droop, left weakness and neglect. tPA given and mechanical thrombectomy  Stroke:  right large MCA infarct mainly at cortex and BG due to right M1 occlusion s/p tPA and IR with TICI3 reperfusion, embolic pattern, source unclear  Resultant intubated and left sided weakness and right gaze preference  CT head no acute finding  CTA head and neck - right M1 occlusion  DSA - right M1 occlusion s/p TICI3 reperfusion  MRI  right large MCA infarct mainly at cortex and BG   MRA unremarkable after IR  2D Echo EF 65-70%  LDL 61  HgbA1c 6.2  Under comfort care now - continue morphine drip  Appreciate palliative care consult - not candidate for residential hospice - will remain as inpt at this time.  ICH with IVH likely due to tPA s/p reversal  tPA reversed with TXA and cryo  CT post procedure showed b/l frontal ICH, left BG and temporal ICH with IVH and SAH  Repeat CT stable with left frontal/BG ICH decompressed to lateral ventricle  MRI confirmed ICH, IVH and SAH, no hydrocephalus. No significant CAA either.   Fibrinogen 370->446  Repeat CT 06/11/18 stable hematoma, infarct and ventricle size,  blood product less than before  LE venous stasis with ulcers and cellulitis and hx of DVT  Wound care on board  Has hx of DVT was on Xarelto at home but it was discontinued on recent discharge on 06/02/18.   LE venous doppler no DVT this admission  Off rocephin   Blood culture neg  Leukocytosis and fever - resolved  Blood culture neg  UA WBC 6-10  Urine culture neg  WBC 14->12.1->12.7->11.3->11.0->10.5  CXR 06/10/18 likely multifocal pneumonia  Now off rocephin (started 06/13/2018)   Short run of V-tach and transient cardiac arrest  Pt also had brief cardiac arrest during IR procedure and under anesthesia - reversed on its own  Captured on tele 06/10/18. Resolved on its own  Hypertension/hypotension . Stable  On comfort care  Other Stroke Risk Factors  Advanced age  Other Active Problems  BPH - on foley catheter   Hospital day # 14   Marvel Plan, MD PhD Stroke Neurology 06/21/2018 10:22 AM   To contact Stroke Continuity provider, please refer to [email or web address]. After hours, contact General Neurology

## 2018-06-21 NOTE — Progress Notes (Signed)
   06/21/18 1600  Clinical Encounter Type  Visited With Patient;Health care provider  Visit Type Initial   Attempted to visit pt.  Pt appeared to be sleeping.  Left note and let care RN know I had been by.  Chaplain remains available for support.    Margretta Sidle resident, [phone]

## 2018-06-21 NOTE — Consult Note (Signed)
Consultation Note Date: 06/21/2018   Patient Name: Justin [name]  DOB: [birth year]  MRN: [id number]  Age / Sex: 84 y.o., male  PCP: Patient, No Pcp Per Referring Physician: Marvel Plan, MD  Reason for Consultation: Hospice Evaluation and Psychosocial/spiritual support  HPI/Patient Profile: 84 y.o. male  with past medical history of venous stasis ulcers of both lower extremities,  DVT (was on xarelto but recently stopped after lower ext cellulitis), former smoker, and BPH with chronic catheter admitted on 06-18-2018 with aphasia, left sided facial droop and left sided weakness. Diagnose with R large MCA infarct - tPA given and mechanical thrombectomy performed. Developed ICH, SAH, IVH after procedure.  Intubated for airway protection. Patient was terminally extubated the evening of 10/1. Patient has been receiving comfort measures since. PMT consulted as family was having difficulty deciding about transfer to residential hospice.  Clinical Assessment and Goals of Care: I have reviewed medical records including EPIC notes, labs and imaging, received report from Dr. Roda Shutters and RN, assessed the patient and then spoke with his son, [name], to discuss diagnosis prognosis, GOC, EOL wishes, disposition and options.  I introduced Palliative Medicine as specialized medical care for people living with serious illness. It focuses on providing relief from the symptoms and stress of a serious illness. The goal is to improve quality of life for both the patient and the family.   We discussed his current illness and what it means in the larger context of his on-going co-morbidities.  Natural disease trajectory and expectations at EOL were discussed. Specifically discussed patient's periods of apnea. We discussed that may be more appropriate to keep patient here due to irregular breathing and periods of apnea - patient may not be stable for transport to residential hospice.  Since family is already uncomfortable with transport we can plan on continuing comfort care here and anticipate hospital death.  Will remove feeding tube. Continue morphine drip. Symptoms well controlled.  Patient's son [name] requests that we call him with updates instead of his mother because she is overwhelmed with situation. He would like to inform her when patient passes.  Questions and concerns were addressed. The family was encouraged to call with questions or concerns.   Primary Decision Maker NEXT OF KIN - wife joined by son    SUMMARY OF RECOMMENDATIONS   - continue comfort care and morphine infusion - d/t periods of apnea and irregular breathing patient does not appear appropriate for transfer to residential hospice especially since family is already uncomfortable with the idea - remove feeding tube  Code Status/Advance Care Planning:  DNR   Symptom Management:   Morphine infusion  Prn robinul  Additional Recommendations (Limitations, Scope, Preferences):  Full Comfort Care  Prognosis:   Hours - Days  Discharge Planning: Anticipated Hospital Death      Primary Diagnoses: Present on Admission: **None**   I have reviewed the medical record, interviewed the patient and family, and examined the patient. The following aspects are pertinent.  Past Medical History:  Diagnosis Date  . Erysipelas   . Scarlet fever   . Venous stasis ulcers of both lower extremities (HCC)    Social History   Socioeconomic History  . Marital status: Married    Spouse name: Not on file  . Number of children: Not on file  . Years of education: Not on file  . Highest education level: Not on file  Occupational History  . 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 Physicist, medical  strain: Not on file  . Food insecurity:    Worry: Not on file    Inability: Not on file  . Transportation needs:    Medical: Not on file    Non-medical: Not on file  Tobacco Use    . Smoking status: Former Smoker    Last attempt to quit: 09/15/1962    Years since quitting: 55.8  . Smokeless tobacco: Never Used  Substance and Sexual Activity  . Alcohol use: No  . Drug use: No  . Sexual activity: Not on file  Lifestyle  . Physical activity:    Days per week: Not on file    Minutes per session: Not on file  . Stress: Not on file  Relationships  . Social connections:    Talks on phone: Not on file    Gets together: Not on file    Attends religious service: Not on file    Active member of club or organization: Not on file    Attends meetings of clubs or organizations: Not on file    Relationship status: Not on file  Other Topics Concern  . Not on file  Social History Narrative   Lives with wife.  2 children in [place], 1 in [place], 1 in [place]   Family History  Problem Relation Age of Onset  . Heart disease Mother   . Cancer Mother        leukemia  . Diabetes Neg Hx    Scheduled Meds: Continuous Infusions: . morphine 4 mg/hr (06/21/18 0640)   PRN Meds:.bisacodyl, glycopyrrolate **OR** glycopyrrolate **OR** glycopyrrolate, LORazepam, morphine No Known Allergies Review of Systems  Unable to perform ROS: Mental status change    Physical Exam  Constitutional: No distress.  Pulmonary/Chest:  Irregular, periods of apnea  Abdominal: Soft.  Musculoskeletal: He exhibits no edema.  Neurological: He is unresponsive.  Skin: He is not diaphoretic.  Cool extremities    Vital Signs: BP 135/64 (BP Location: Right Arm)   Pulse 72   Temp 99.2 F (37.3 C) (Axillary)   Resp (!) 6   Ht 5\' 10"  (1.778 m)   Wt 79.5 kg   SpO2 97%   BMI 25.15 kg/m  Pain Scale: Faces   Pain Score: 7    SpO2: SpO2: 97 % O2 Device:SpO2: 97 % O2 Flow Rate: .O2 Flow Rate (L/min): 2 L/min  IO: Intake/output summary:   Intake/Output Summary (Last 24 hours) at 06/21/2018 1023 Last data filed at 06/21/2018 0640 Gross per 24 hour  Intake 194.12 ml  Output 950 ml  Net -755.88 ml     LBM: Last BM Date: 06/14/18 Baseline Weight: Weight: 78.1 kg Most recent weight: Weight: 79.5 kg     Palliative Assessment/Data: PPS 10%     Time Total: 35 minutes Greater than 50%  of this time was spent counseling and coordinating care related to the above assessment and plan.  Gerlean Ren, DNP, AGNP-C Palliative Medicine Team [phone] Pager: [phone]

## 2018-06-22 NOTE — Progress Notes (Signed)
STROKE TEAM PROGRESS NOTE   SUBJECTIVE (INTERVAL HISTORY) No family is at the bedside. Pt remains on morphine drip. He appears to be not in distress. Still has long period apnea. No significant change from yesterday.   OBJECTIVE Temp:  [100.2 F (37.9 C)] 100.2 F (37.9 C) (10/08 0225) Pulse Rate:  [106] 106 (10/08 0225) Resp:  [8-9] 8 (10/08 0832) BP: (119)/(86) 119/86 (10/08 0225) SpO2:  [92 %] 92 % (10/08 0225)   IMAGING  Ct Angio Head W Or Wo Contrast Ct Angio Neck W Or Wo Contrast 06/30/2018 IMPRESSION:  1. Emergent large vessel occlusion of the right middle cerebral artery M1 segment with minimal collateral flow demonstrated in the right MCA territory.  2. Biapical pulmonary opacities most consistent with fibrosis or scarring. A superimposed mass or consolidation would be difficult to exclude. If prior chest CTs exists, comparison would be helpful.  3. No arterial occlusion or hemodynamically significant stenosis of the major arteries of the neck.    Ct Head Wo Contrast 05/28/2018 IMPRESSION:  1.  No acute intracranial abnormality.  2. Atrophy with moderate to advanced chronic small vessel ischemia.    Ct Head Wo Contrast Jun 30, 2018 IMPRESSION:  1. Multiple new intraparenchymal hematomas with hematocrit levels consistent with acute and suspected active hemorrhage.  2. LEFT lateral intraventricular hemorrhage, possible parenchymal extension.  3. Scattered extra-axial blood products, confounded by postcontrast imaging.   Ct Head Code Stroke Wo Contrast 06-30-18 IMPRESSION:  1. No acute hemorrhage or mass lesion.  2. ASPECTS is 10.  3. 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    Ct Head Wo Contrast 06/08/2018 IMPRESSION:  1. Interval decompression of left paramedian frontal lobe brain parenchymal hemorrhage into the ventricular system with increased blood products in both lateral ventricular atria. Stable ventricle size.  2.  Otherwise stable brain parenchymal hemorrhage in paramedian frontal, parietal, and occipital lobes as well as the left temporal lobe.  3. Stable moderate volume of subarachnoid hemorrhage over convexities and cerebellum.  4. No new acute intracranial abnormality identified.    Ct Head Wo Contrast 06/11/2018 IMPRESSION:  1. Interval decrease in size of multifocal intraparenchymal cerebral hemorrhages, with decreased volume of subarachnoid and intraventricular hemorrhage. Stable ventricular size.  2. Continued interval evolution of large right MCA territory infarct, stable in size and distribution relative to recent MRI. No evidence for hemorrhagic transformation or significant mass effect.  3. No other new acute intracranial abnormality.    MRI and MRA 06/08/2018 1. Acute large right MCA infarct primarily involving cortex and basal ganglia. 2. Bilateral parenchymal hemorrhages, subarachnoid hemorrhage, and intraventricular hemorrhage, not significantly changed from today's earlier CT. 3. Moderate cerebral atrophy and chronic small vessel ischemic disease. 4. Negative head MRA. Continued patency of the revascularized right MCA.   Dg Chest Port 1 View 30-Jun-2018 IMPRESSION:  No active disease.  Endotracheal tube tip 4.8 cm above the carina.    TTE - Technically difficult study. LVEF 65-70%, moderate LVH, normal   wall motion, mild aortic stenosis - mean gradient of 12 mmHg -   AVA around 1.7 cm2, trace to mild MR, normal LA size, dilated   IVC.   LE venous doppler Right: There is no evidence of deep vein thrombosis in the lower extremity. However, portions of this examination were limited- see technologist comments above. Left: There is no evidence of deep vein thrombosis in the lower extremity. However, portions of this examination were limited- see technologist comments above.  PHYSICAL EXAM  Temp:  [100.2 F (37.9 C)] 100.2 F (37.9 C) (10/08 0225) Pulse Rate:   [106] 106 (10/08 0225) Resp:  [8-9] 8 (10/08 0832) BP: (119)/(86) 119/86 (10/08 0225) SpO2:  [92 %] 92 % (10/08 0225)  General - Frail elderly Caucasian male not in distress.  Ophthalmologic - fundi not visualized due to comfort care.  Cardiovascular - Regular rate and rhythm.  Neuro - limited exam due to comfort care. Eyes closed, does not open on voice. Not responding to greetings or commands. Facial symmetric. Long period of apnea on morphine drip. No spontaneous movement, no distress. Sensation, coordination and gait not tested due to comfort care.  No significant exam change from yesterday.    ASSESSMENT/PLAN Mr. Justin [name] is a 84 y.o. male with history of DVT on Xarelto but stopped recently after LE cellulitis, BPH on foley placed recently in [facility] admitted for aphasia, left facial droop, left weakness and neglect. tPA given and mechanical thrombectomy  Stroke:  right large MCA infarct mainly at cortex and BG due to right M1 occlusion s/p tPA and IR with TICI3 reperfusion, embolic pattern, source unclear  Resultant intubated and left sided weakness and right gaze preference  CT head no acute finding  CTA head and neck - right M1 occlusion  DSA - right M1 occlusion s/p TICI3 reperfusion  MRI  right large MCA infarct mainly at cortex and BG   MRA unremarkable after IR  2D Echo EF 65-70%  LDL 61  HgbA1c 6.2  Under comfort care now - continue morphine drip  Appreciate palliative care consult - not candidate for residential hospice - will remain as inpt at this time.  ICH with IVH likely due to tPA s/p reversal  tPA reversed with TXA and cryo  CT post procedure showed b/l frontal ICH, left BG and temporal ICH with IVH and SAH  Repeat CT stable with left frontal/BG ICH decompressed to lateral ventricle  MRI confirmed ICH, IVH and SAH, no hydrocephalus. No significant CAA either.   Fibrinogen 370->446  Repeat CT 06/11/18 stable hematoma, infarct and ventricle  size, blood product less than before  LE venous stasis with ulcers and cellulitis and hx of DVT  Wound care on board  Has hx of DVT was on Xarelto at home but it was discontinued on recent discharge on 06/02/18.   LE venous doppler no DVT this admission  Off rocephin   Blood culture neg  Leukocytosis and fever - resolved  Blood culture neg  UA WBC 6-10  Urine culture neg  WBC 14->12.1->12.7->11.3->11.0->10.5  CXR 06/10/18 likely multifocal pneumonia  Now off rocephin (started 05/21/2018)   Short run of V-tach and transient cardiac arrest  Pt also had brief cardiac arrest during IR procedure and under anesthesia - reversed on its own  Captured on tele 06/10/18. Resolved on its own  Hypertension/hypotension . Stable  On comfort care  Other Stroke Risk Factors  Advanced age  Other Active Problems  BPH - on foley catheter   Taken off O2 and NG tube  Hospital day # 15   Marvel Plan, MD PhD Stroke Neurology 06/22/2018 10:39 AM   To contact Stroke Continuity provider, please refer to [email or web address]. After hours, contact General Neurology

## 2018-06-22 NOTE — Progress Notes (Signed)
Daily Progress Note   Patient Name: Justin [name]       Date: 06/22/2018 DOB: [birth year]  Age: 84 y.o. MRN#: [id number] Attending Physician: Marvel Plan, MD Primary Care Physician: Patient, No Pcp Per Admit Date: Jul 04, 2018  Reason for Consultation/Follow-up: Hospice Evaluation and Psychosocial/spiritual support  Subjective: Patient unresponsive, appears comfortable, no family at bedside  Length of Stay: 15  Current Medications: Scheduled Meds:    Continuous Infusions: . morphine 4 mg/hr (06/22/18 0600)    PRN Meds: bisacodyl, glycopyrrolate **OR** glycopyrrolate **OR** glycopyrrolate, LORazepam, morphine  Physical Exam     Constitutional: No distress.  Pulmonary/Chest:  Irregular Abdominal: Soft.  Musculoskeletal: He exhibits no edema.  Neurological: He is unresponsive.  Skin: He is not diaphoretic.  Cool extremities    Vital Signs: BP 119/86 (BP Location: Right Arm)   Pulse (!) 106   Temp 100.2 F (37.9 C) (Oral)   Resp (!) 8   Ht 5\' 10"  (1.778 m)   Wt 79.5 kg   SpO2 92%   BMI 25.15 kg/m  SpO2: SpO2: 92 % O2 Device: O2 Device: Nasal Cannula O2 Flow Rate: O2 Flow Rate (L/min): 2 L/min  Intake/output summary:   Intake/Output Summary (Last 24 hours) at 06/22/2018 1349 Last data filed at 06/22/2018 1100 Gross per 24 hour  Intake 141.3 ml  Output 1325 ml  Net -1183.7 ml   LBM: Last BM Date: 06/14/18 Baseline Weight: Weight: 78.1 kg Most recent weight: Weight: 79.5 kg       Palliative Assessment/Data: PPS 10%    Flowsheet Rows     Most Recent Value  Intake Tab  Referral Department  Neurology  Unit at Time of Referral  Med/Surg Unit  Palliative Care Primary Diagnosis  Neurology  Date Notified  06/19/18  Palliative Care Type  New Palliative care  Reason for  referral  Clarify Goals of Care  Date of Admission  July 04, 2018  Date first seen by Palliative Care  06/21/18  # of days Palliative referral response time  2 Day(s)  # of days IP prior to Palliative referral  12  Clinical Assessment  Palliative Performance Scale Score  10%  Psychosocial & Spiritual Assessment  Palliative Care Outcomes  Patient/Family meeting held?  Yes  Who was at the meeting?  son  Palliative Care Outcomes  Clarified goals of care, Provided end of life care assistance      Patient Active Problem List   Diagnosis Date Noted  . Acute respiratory failure (HCC)   . Comfort measures only status   . Goals of care, counseling/discussion   . Palliative care by specialist   . Malnutrition of moderate degree 06/10/2018  . Acute embolic stroke (HCC) July 04, 2018  . Endotracheal tube present   . Generalized weakness   . Venous stasis ulcer (HCC) 05/28/2018  . Cellulitis of multiple sites of lower extremity 05/28/2018  . Erysipelas 09/30/2012  . DVT of lower extremity (deep venous thrombosis) (HCC) 09/28/2012  . Hematuria 09/26/2012  . Cellulitis 09/25/2012  . Venous stasis dermatitis 09/25/2012    Palliative Care Assessment & Plan   HPI: 84 y.o. male  with past medical history of venous stasis ulcers of both lower extremities,  DVT (  was on xarelto but recently stopped after lower ext cellulitis), former smoker, and BPH with chronic catheter admitted on July 05, 2018 with aphasia, left sided facial droop and left sided weakness. Diagnose with R large MCA infarct - tPA given and mechanical thrombectomy performed. Developed ICH, SAH, IVH after procedure. Intubated for airway protection. Patient was terminally extubated the evening of 10/1. Patient has been receiving comfort measures since. PMT consulted as family was having difficulty deciding about transfer to residential hospice.  Assessment: Follow up on patient today. Remains comfortable. Feeding tube has been removed. Remains  on morphine 4 mg/hr infusion. Per chart review patient has not required boluses or prns. Removed oxygen as it was at 0.5L and not adding to patient's comfort. Breathing appears more regular today than yesterday. No periods of apnea noted during my exam.   Called son and provided update. Discussed that patient appears stable for transfer to hospice home. Son is open to this; however, tells me that transfer was difficult before because of paperwork and he is out of town. He asks about prognosis and we discussed uncertainty of this.   Recommendations/Plan:  Continue comfort care and morphine infusion - oxygen removed  Son is open to transfer to hospice home but had difficulties with paperwork previously? He would prefer hospital death but understands hospice home may be more appropriate  Goals of Care and Additional Recommendations:  Limitations on Scope of Treatment: Full Comfort Care  Code Status:  DNR  Prognosis:   Hours - Days  Discharge Planning:  Anticipated Hospital Death  Care plan was discussed with Dr. Jannifer Rodney, RN, son  Thank you for allowing the Palliative Medicine Team to assist in the care of this patient.   Total Time 35 minutes Prolonged Time Billed  no       Greater than 50%  of this time was spent counseling and coordinating care related to the above assessment and plan.  Gerlean Ren, DNP, [facility] Palliative Medicine Team Team Phone # [phone]  Pager [phone]

## 2018-07-16 NOTE — Progress Notes (Signed)
2nd RN witness was Micael Hampshire RN, chaplain notified, [facility] notified referral number [id number].

## 2018-07-16 NOTE — Progress Notes (Signed)
Dr. Roda Shutters notified of pt passing at 1320. He will notify family.

## 2018-07-16 NOTE — Plan of Care (Signed)
Son, Justin [name], was informed over the phone. He is going to let his mom know about it. He expressed appreciation to our medical staff over the phone.   Marvel Plan, MD PhD Stroke Neurology 06/24/18 1:48 PM

## 2018-07-16 NOTE — Progress Notes (Signed)
   July 10, 2018 1400  Clinical Encounter Type  Visited With Health care provider  Visit Type Follow-up;Death  Referral From Nurse   Responded to request from RN due to pt death.  No family present.    Chaplain available if needed/desired.  Margretta Sidle resident, [phone]

## 2018-07-16 NOTE — Death Summary Note (Signed)
DEATH SUMMARY   Patient Details  Name: Justin [name] MRN: [id number] DOB: Feb 02, 1928  Admission/Discharge Information   Admit Date:  June 15, 2018  Date of Death:    Time of Death:    Length of Stay: 2023/02/06  Referring Physician: Patient, No Pcp Per   Reason(s) for Hospitalization  stroke  Diagnoses  Preliminary cause of death:  Secondary Diagnoses (including complications and co-morbidities):  Active Problems:   Acute embolic stroke (HCC)   ICH with IVH post tPA   LE venous stasis with cellulitis   V-tach   leukocytosis   Malnutrition of moderate degree   Acute respiratory failure (HCC)   Comfort measures only status   Palliative care by specialist   Brief Hospital Course (including significant findings, care, treatment, and services provided and events leading to death)  Justin [name] is a 84 y.o. year old male with history of DVT on Xarelto but stopped recently after LE cellulitis, BPH on foley placed recently PTA in [facility] admitted for aphasia, left facial droop, left weakness and neglect. tPA given and mechanical thrombectomy was performed.   Stroke:  right large MCA infarct mainly at cortex and BG due to right M1 occlusion s/p tPA and IR with TICI3 reperfusion, embolic pattern, source unclear  Resultant intubated and left sided weakness and right gaze preference  CT head no acute finding  CTA head and neck - right M1 occlusion  DSA - right M1 occlusion s/p TICI3 reperfusion  MRI  right large MCA infarct mainly at cortex and BG   MRA unremarkable after IR  2D Echo EF 65-70%  LDL 61  HgbA1c 6.2  Due to poor prognosis, family requested comfort - palliative care on board - pt passed away 01-Jul-2018 1:20pm  ICH with IVH likely due to tPA s/p reversal  tPA reversed with TXA and cryo  CT post procedure showed b/l frontal ICH, left BG and temporal ICH with IVH and SAH  Repeat CT stable with left frontal/BG ICH decompressed to lateral ventricle  MRI confirmed ICH,  IVH and SAH, no hydrocephalus. No significant CAA either.   Fibrinogen 370->446  Repeat CT 06/11/18 stable hematoma, infarct and ventricle size, blood product less than before  LE venous stasis with ulcers and cellulitis and hx of DVT  Wound care on board  Has hx of DVT was on Xarelto at home but it was discontinued on recent discharge on 06/02/18.   LE venous doppler no DVT this admission  Off rocephin   Blood culture neg  Leukocytosis and fever - resolved  Blood culture neg  UA WBC 6-10  Urine culture neg  WBC 14->12.1->12.7->11.3->11.0->10.5  CXR 06/10/18 likely multifocal pneumonia  off rocephin (started 2018/06/15)   Short run of V-tach and transient cardiac arrest  Pt also had brief cardiac arrest during IR procedure and under anesthesia - reversed on its own  Captured on tele 06/10/18. Resolved on its own  Hypertension/hypotension  Stable  Under comfort care  Other Stroke Risk Factors  Advanced age  Other Active Problems  BPH - on foley catheter    Pertinent Labs and Studies  Significant Diagnostic Studies Ct Angio Head W Or Wo Contrast  Result Date: 15-Jun-2018 CLINICAL DATA:  Left-sided weakness and slurred speech. EXAM: CT ANGIOGRAPHY HEAD AND NECK TECHNIQUE: Multidetector CT imaging of the head and neck was performed using the standard protocol during bolus administration of intravenous contrast. Multiplanar CT image reconstructions and MIPs were obtained to evaluate the vascular anatomy. Carotid stenosis measurements (  when applicable) are obtained utilizing NASCET criteria, using the distal internal carotid diameter as the denominator. CONTRAST:  50mL ISOVUE-370 IOPAMIDOL (ISOVUE-370) INJECTION 76% COMPARISON:  Head CT same day FINDINGS: CTA NECK FINDINGS AORTIC ARCH: There is mild calcific atherosclerosis of the aortic arch. There is no aneurysm, dissection or hemodynamically significant stenosis of the visualized ascending aorta and aortic arch.  Conventional 3 vessel aortic branching pattern. The visualized proximal subclavian arteries are widely patent. RIGHT CAROTID SYSTEM: --Common carotid artery: Widely patent origin without common carotid artery dissection or aneurysm. --Internal carotid artery: No dissection, occlusion or aneurysm. No hemodynamically significant stenosis. --External carotid artery: No acute abnormality. LEFT CAROTID SYSTEM: --Common carotid artery: Widely patent origin without common carotid artery dissection or aneurysm. --Internal carotid artery:No dissection, occlusion or aneurysm. No hemodynamically significant stenosis. --External carotid artery: No acute abnormality. VERTEBRAL ARTERIES: Left dominant configuration. Both origins are normal. No dissection, occlusion or flow-limiting stenosis to the vertebrobasilar confluence. SKELETON: There is no bony spinal canal stenosis. No lytic or blastic lesion. OTHER NECK: Normal pharynx, larynx and major salivary glands. No cervical lymphadenopathy. Unremarkable thyroid gland. UPPER CHEST: Biapical opacities most suggestive of fibrosis or scarring. A superimposed mass or consolidation with difficult to exclude. CTA HEAD FINDINGS ANTERIOR CIRCULATION: --Intracranial internal carotid arteries: Normal. --Anterior cerebral arteries: Normal. Both A1 segments are present. Patent anterior communicating artery. --Middle cerebral arteries: Complete occlusion of the right middle cerebral artery M1 segment. No significant collateral flow within the right MCA territory. The left MCA is normal. --Posterior communicating arteries: Present on the right, absent on the left. POSTERIOR CIRCULATION: --Basilar artery: Normal. --Posterior cerebral arteries: Normal. --Superior cerebellar arteries: Normal. --Inferior cerebellar arteries: Normal anterior and posterior inferior cerebellar arteries. VENOUS SINUSES: As permitted by contrast timing, patent. ANATOMIC VARIANTS: None DELAYED PHASE: Not performed.  Review of the MIP images confirms the above findings. IMPRESSION: 1. Emergent large vessel occlusion of the right middle cerebral artery M1 segment with minimal collateral flow demonstrated in the right MCA territory. 2. Biapical pulmonary opacities most consistent with fibrosis or scarring. A superimposed mass or consolidation would be difficult to exclude. If prior chest CTs exists, comparison would be helpful. 3. No arterial occlusion or hemodynamically significant stenosis of the major arteries of the neck. Critical Value/emergent results were called by telephone at the time of interpretation on 2018/06/12 at 4:27 pm to Dr. Caryl Pina, who verbally acknowledged these results. Electronically Signed   By: Deatra Robinson M.D.   On: 06/12/18 16:43   Ct Head Wo Contrast  Result Date: 06/11/2018 CLINICAL DATA:  Follow-up examination for intracranial hemorrhage and stroke. EXAM: CT HEAD WITHOUT CONTRAST TECHNIQUE: Contiguous axial images were obtained from the base of the skull through the vertex without intravenous contrast. COMPARISON:  Prior CT from 06/08/2018. FINDINGS: Brain: Multiple brain parenchymal hemorrhages involving the paramedian frontoparietal regions as well as the left temporal lobe again seen, stable in distribution. Overall, these hemorrhages are slightly decreased in size as compared to previous exam. Scattered areas of additional subarachnoid hemorrhage involving the cerebral and cerebellar hemispheres are also decreased from previous. Intraventricular hemorrhage with blood within the occipital horns of both lateral ventricles is decreased. Ventricular size is relatively stable. Superimposed large evolving right MCA territory infarct grossly stable in distribution as compared to previous MRI. No new intracranial hemorrhage or large vessel territory infarct. No extra-axial fluid collection. Basilar cisterns remain patent. No midline shift. Underlying atrophy with chronic small vessel ischemic  disease again noted. Vascular: No hyperdense vessel. Scattered  vascular calcifications noted within the carotid siphons. Skull: Scalp soft tissues and calvarium demonstrate no acute finding. Sinuses/Orbits: Globes and orbital soft tissues within normal limits. Paranasal sinuses and mastoid air cells are largely clear. Nasogastric tube noted. Other: None. IMPRESSION: 1. Interval decrease in size of multifocal intraparenchymal cerebral hemorrhages, with decreased volume of subarachnoid and intraventricular hemorrhage. Stable ventricular size. 2. Continued interval evolution of large right MCA territory infarct, stable in size and distribution relative to recent MRI. No evidence for hemorrhagic transformation or significant mass effect. 3. No other new acute intracranial abnormality. Electronically Signed   By: Rise Mu M.D.   On: 06/11/2018 04:46   Ct Head Wo Contrast  Result Date: 06/08/2018 CLINICAL DATA:  84 y/o  M; follow-up of intracranial hemorrhage. EXAM: CT HEAD WITHOUT CONTRAST TECHNIQUE: Contiguous axial images were obtained from the base of the skull through the vertex without intravenous contrast. COMPARISON:  Jun 19, 2018 CT head. FINDINGS: Brain: Multiple brain parenchymal hemorrhages within the paramedian frontal, parietal, and occipital lobes as well as the left temporal lobes with hematocrit levels. Large area of hemorrhage within the left paramedian posterior frontal cortex has decompressed into the ventricular system in the interim. Brain parenchymal hemorrhage is otherwise stable. Stable moderate volume of diffuse subarachnoid hemorrhage over cerebral convexities and cerebellum. Mild interval increase in intraventricular hemorrhage likely redistributed from the left frontal parenchymal hematoma as above within new blood fluid level in the right lateral ventricle atria. Ventricle size is stable. No new acute intracranial hemorrhage, stroke, or focal mass effect. No herniation. Stable  background of chronic microvascular ischemic changes and volume loss of the brain. Vascular: Calcific atherosclerosis of the carotid siphons. Skull: Normal. Negative for fracture or focal lesion. Sinuses/Orbits: No acute finding. Other: None. IMPRESSION: 1. Interval decompression of left paramedian frontal lobe brain parenchymal hemorrhage into the ventricular system with increased blood products in both lateral ventricular atria. Stable ventricle size. 2. Otherwise stable brain parenchymal hemorrhage in paramedian frontal, parietal, and occipital lobes as well as the left temporal lobe. 3. Stable moderate volume of subarachnoid hemorrhage over convexities and cerebellum. 4. No new acute intracranial abnormality identified. Electronically Signed   By: Mitzi Hansen M.D.   On: 06/08/2018 00:54   Ct Head Wo Contrast  Result Date: 19-Jun-2018 CLINICAL DATA:  Stroke. Status post endovascular revascularization M1 occlusion and tPA. EXAM: CT HEAD WITHOUT CONTRAST TECHNIQUE: Contiguous axial images were obtained from the base of the skull through the vertex without intravenous contrast. COMPARISON:  CT HEAD 19-Jun-2018 at 1607 hours and procedural CT HEAD at 06/19/2018 1828 hours. FINDINGS: BRAIN: Multiple intraparenchymal hematomas including bilateral frontal parietal lobes, LEFT temporal lobe and likely within vermis. Regional mass effect of frontoparietal hematomas flattening the lateral ventricles. LEFT intraventricular blood products with hematocrit level. Suspected hematocrit levels within the hematomas, however there may be a component of contrast. Scattered extra-axial blood products predominately along the interhemispheric fissure and supra cerebellar cistern, cerebellar folia. Old LEFT occipital lobe infarct. No acute large vascular territory infarct. No hydrocephalus. Basal cisterns are patent. VASCULAR: Minimal calcific atherosclerosis of the carotid siphons. SKULL: No skull  fracture. No significant scalp soft tissue swelling. SINUSES/ORBITS: Trace paranasal sinus mucosal thickening. Mastoid air cells are well aerated.The included ocular globes and orbital contents are non-suspicious. OTHER: None. IMPRESSION: 1. Multiple new intraparenchymal hematomas with hematocrit levels consistent with acute and suspected active hemorrhage. 2. LEFT lateral intraventricular hemorrhage, possible parenchymal extension. 3. Scattered extra-axial blood products, confounded by postcontrast imaging. 4.  Critical Value/emergent results were called by telephone at the time of interpretation on 06/27/2018 at 7:39 pm to Dr. Wilford Corner, Neurology, who verbally acknowledged these results. Electronically Signed   By: Awilda Metro M.D.   On: 06-27-2018 19:40   Ct Head Wo Contrast  Result Date: 05/28/2018 CLINICAL DATA:  Visual loss or uveitis/scleritis EXAM: CT HEAD WITHOUT CONTRAST TECHNIQUE: Contiguous axial images were obtained from the base of the skull through the vertex without intravenous contrast. COMPARISON:  Remote head CT 06/02/2005 FINDINGS: Brain: Progressive atrophy and chronic small vessel ischemia from 2006 exam. No intracranial hemorrhage, mass effect, or midline shift. No hydrocephalus. The basilar cisterns are patent. No evidence of territorial infarct or acute ischemia. No extra-axial or intracranial fluid collection. Vascular: No hyperdense vessel. Skull: No fracture or focal lesion. Sinuses/Orbits: Paranasal sinuses and mastoid air cells are clear. The visualized orbits are unremarkable. Other: None. IMPRESSION: 1.  No acute intracranial abnormality. 2. Atrophy with moderate to advanced chronic small vessel ischemia. Electronically Signed   By: Narda Rutherford M.D.   On: 05/28/2018 03:53   Ct Angio Neck W Or Wo Contrast  Result Date: 06-27-2018 CLINICAL DATA:  Left-sided weakness and slurred speech. EXAM: CT ANGIOGRAPHY HEAD AND NECK TECHNIQUE: Multidetector CT imaging of the head and  neck was performed using the standard protocol during bolus administration of intravenous contrast. Multiplanar CT image reconstructions and MIPs were obtained to evaluate the vascular anatomy. Carotid stenosis measurements (when applicable) are obtained utilizing NASCET criteria, using the distal internal carotid diameter as the denominator. CONTRAST:  50mL ISOVUE-370 IOPAMIDOL (ISOVUE-370) INJECTION 76% COMPARISON:  Head CT same day FINDINGS: CTA NECK FINDINGS AORTIC ARCH: There is mild calcific atherosclerosis of the aortic arch. There is no aneurysm, dissection or hemodynamically significant stenosis of the visualized ascending aorta and aortic arch. Conventional 3 vessel aortic branching pattern. The visualized proximal subclavian arteries are widely patent. RIGHT CAROTID SYSTEM: --Common carotid artery: Widely patent origin without common carotid artery dissection or aneurysm. --Internal carotid artery: No dissection, occlusion or aneurysm. No hemodynamically significant stenosis. --External carotid artery: No acute abnormality. LEFT CAROTID SYSTEM: --Common carotid artery: Widely patent origin without common carotid artery dissection or aneurysm. --Internal carotid artery:No dissection, occlusion or aneurysm. No hemodynamically significant stenosis. --External carotid artery: No acute abnormality. VERTEBRAL ARTERIES: Left dominant configuration. Both origins are normal. No dissection, occlusion or flow-limiting stenosis to the vertebrobasilar confluence. SKELETON: There is no bony spinal canal stenosis. No lytic or blastic lesion. OTHER NECK: Normal pharynx, larynx and major salivary glands. No cervical lymphadenopathy. Unremarkable thyroid gland. UPPER CHEST: Biapical opacities most suggestive of fibrosis or scarring. A superimposed mass or consolidation with difficult to exclude. CTA HEAD FINDINGS ANTERIOR CIRCULATION: --Intracranial internal carotid arteries: Normal. --Anterior cerebral arteries: Normal.  Both A1 segments are present. Patent anterior communicating artery. --Middle cerebral arteries: Complete occlusion of the right middle cerebral artery M1 segment. No significant collateral flow within the right MCA territory. The left MCA is normal. --Posterior communicating arteries: Present on the right, absent on the left. POSTERIOR CIRCULATION: --Basilar artery: Normal. --Posterior cerebral arteries: Normal. --Superior cerebellar arteries: Normal. --Inferior cerebellar arteries: Normal anterior and posterior inferior cerebellar arteries. VENOUS SINUSES: As permitted by contrast timing, patent. ANATOMIC VARIANTS: None DELAYED PHASE: Not performed. Review of the MIP images confirms the above findings. IMPRESSION: 1. Emergent large vessel occlusion of the right middle cerebral artery M1 segment with minimal collateral flow demonstrated in the right MCA territory. 2. Biapical pulmonary opacities most consistent with fibrosis or  scarring. A superimposed mass or consolidation would be difficult to exclude. If prior chest CTs exists, comparison would be helpful. 3. No arterial occlusion or hemodynamically significant stenosis of the major arteries of the neck. Critical Value/emergent results were called by telephone at the time of interpretation on 06-24-2018 at 4:27 pm to Dr. Caryl Pina, who verbally acknowledged these results. Electronically Signed   By: Deatra Robinson M.D.   On: 24-Jun-2018 16:43   Mr Maxine Glenn Head Wo Contrast  Result Date: 06/08/2018 CLINICAL DATA:  Right MCA stroke with right M1 occlusion status post IV tPA and endovascular thrombectomy. Bilateral parenchymal and extra-axial hemorrhage on follow-up CTs. EXAM: MRI HEAD WITHOUT CONTRAST MRA HEAD WITHOUT CONTRAST TECHNIQUE: Multiplanar, multiecho pulse sequences of the brain and surrounding structures were obtained without intravenous contrast. Angiographic images of the head were obtained using MRA technique without contrast. COMPARISON:  Head CT  06/08/2018 and CTA June 24, 2018 FINDINGS: MRI HEAD FINDINGS Brain: There is an acute right MCA infarct with largely confluent cortical restricted diffusion throughout much of the frontal and parietal lobes as well as insula with posterior temporal and lateral occipital lobe involvement as well. The white matter of the right MCA territory is largely spared, however the right basal ganglia are involved. Scattered areas of diffusion signal abnormality elsewhere in both cerebral hemispheres are predominantly related to blood products, however there may be a few small areas of nonhemorrhagic acute infarct superimposed. Punctate acute infarcts are present in the cerebellum bilaterally and left midbrain. Parenchymal hemorrhages are again seen in the cerebral and cerebellar hemispheres bilaterally with unchanged appearance of the largest hemorrhage is located in the paramedian frontal lobes and left temporal lobe. Numerous petechial hemorrhages are more conspicuous on MRI than on CT. Bilateral supratentorial and posterior fossa subarachnoid hemorrhage and intraventricular hemorrhage are similar to the prior CT. The ventricles are unchanged in size. There is moderate cerebral atrophy. There is a background of moderate chronic small vessel ischemic disease in the cerebral white matter. Vascular: Major intracranial vascular flow voids are preserved. Skull and upper cervical spine: Unremarkable bone marrow signal. Sinuses/Orbits: Unremarkable orbits. Paranasal sinuses and mastoid air cells are clear. Other: None. MRA HEAD FINDINGS The visualized distal vertebral arteries are patent to the basilar with the left being dominant. Patent PICA, AICA, and SCA origins are identified bilaterally. The basilar artery is widely patent. There are right larger than left posterior communicating arteries with a fetal origin of the right PCA. Both PCAs are patent without evidence of significant stenosis. The internal carotid arteries are widely  patent from skull base to carotid termini. ACAs and MCAs are patent without evidence of proximal branch occlusion or significant stenosis. Specifically, the revascularized right M1 segment remains patent. No aneurysm is identified. IMPRESSION: 1. Acute large right MCA infarct primarily involving cortex and basal ganglia. 2. Bilateral parenchymal hemorrhages, subarachnoid hemorrhage, and intraventricular hemorrhage, not significantly changed from today's earlier CT. 3. Moderate cerebral atrophy and chronic small vessel ischemic disease. 4. Negative head MRA. Continued patency of the revascularized right MCA. Electronically Signed   By: Sebastian Ache M.D.   On: 06/08/2018 12:54   Mr Brain Wo Contrast  Result Date: 06/08/2018 CLINICAL DATA:  Right MCA stroke with right M1 occlusion status post IV tPA and endovascular thrombectomy. Bilateral parenchymal and extra-axial hemorrhage on follow-up CTs. EXAM: MRI HEAD WITHOUT CONTRAST MRA HEAD WITHOUT CONTRAST TECHNIQUE: Multiplanar, multiecho pulse sequences of the brain and surrounding structures were obtained without intravenous contrast. Angiographic images of the head were  obtained using MRA technique without contrast. COMPARISON:  Head CT 06/08/2018 and CTA 05/19/2018 FINDINGS: MRI HEAD FINDINGS Brain: There is an acute right MCA infarct with largely confluent cortical restricted diffusion throughout much of the frontal and parietal lobes as well as insula with posterior temporal and lateral occipital lobe involvement as well. The white matter of the right MCA territory is largely spared, however the right basal ganglia are involved. Scattered areas of diffusion signal abnormality elsewhere in both cerebral hemispheres are predominantly related to blood products, however there may be a few small areas of nonhemorrhagic acute infarct superimposed. Punctate acute infarcts are present in the cerebellum bilaterally and left midbrain. Parenchymal hemorrhages are again  seen in the cerebral and cerebellar hemispheres bilaterally with unchanged appearance of the largest hemorrhage is located in the paramedian frontal lobes and left temporal lobe. Numerous petechial hemorrhages are more conspicuous on MRI than on CT. Bilateral supratentorial and posterior fossa subarachnoid hemorrhage and intraventricular hemorrhage are similar to the prior CT. The ventricles are unchanged in size. There is moderate cerebral atrophy. There is a background of moderate chronic small vessel ischemic disease in the cerebral white matter. Vascular: Major intracranial vascular flow voids are preserved. Skull and upper cervical spine: Unremarkable bone marrow signal. Sinuses/Orbits: Unremarkable orbits. Paranasal sinuses and mastoid air cells are clear. Other: None. MRA HEAD FINDINGS The visualized distal vertebral arteries are patent to the basilar with the left being dominant. Patent PICA, AICA, and SCA origins are identified bilaterally. The basilar artery is widely patent. There are right larger than left posterior communicating arteries with a fetal origin of the right PCA. Both PCAs are patent without evidence of significant stenosis. The internal carotid arteries are widely patent from skull base to carotid termini. ACAs and MCAs are patent without evidence of proximal branch occlusion or significant stenosis. Specifically, the revascularized right M1 segment remains patent. No aneurysm is identified. IMPRESSION: 1. Acute large right MCA infarct primarily involving cortex and basal ganglia. 2. Bilateral parenchymal hemorrhages, subarachnoid hemorrhage, and intraventricular hemorrhage, not significantly changed from today's earlier CT. 3. Moderate cerebral atrophy and chronic small vessel ischemic disease. 4. Negative head MRA. Continued patency of the revascularized right MCA. Electronically Signed   By: Sebastian Ache M.D.   On: 06/08/2018 12:54   Ir Ct Head Ltd  Result Date:  06/08/2018 INDICATION: 84 year old male with a history of acute right MCA stroke, left-sided symptoms. Functional baseline with NIH stroke scale of 23 EXAM: ULTRASOUND GUIDED ACCESS RIGHT COMMON FEMORAL ARTERY CEREBRAL ANGIOGRAM MECHANICAL THROMBECTOMY FOR EMERGENT LARGE VESSEL OCCLUSION RIGHT MCA DEPLOYMENT ANGIO-SEAL FOR HEMOSTASIS LIMITED HEAD CT COMPARISON:  CT imaging same day 06/10/2018 MEDICATIONS: 2 g Ancef. The antibiotic was administered within 1 hour of the procedure ANESTHESIA/SEDATION: General endotracheal tube anesthesia The patient was also continuously monitored during the procedure by the interventional radiology nurse under my direct supervision. CONTRAST:  100 cc [facility] FLUOROSCOPY TIME:  Fluoroscopy Time: 35 minutes 30 seconds (1761 mGy). COMPLICATIONS: Intracranial hemorrhage identified on flat panel CT TECHNIQUE: Informed written consent was obtained from the patient's family after a thorough discussion of the procedural risks, benefits and alternatives. Specific risks discussed include: Bleeding, infection, contrast reaction, kidney injury/failure, need for further procedure/surgery, arterial injury or dissection, embolization to new territory, intracranial hemorrhage (10-15% risk), neurologic deterioration, cardiopulmonary collapse, death. All questions were addressed. Maximal Sterile Barrier Technique was utilized including during the procedure including caps, mask, sterile gowns, sterile gloves, sterile drape, hand hygiene and skin antiseptic. A timeout was  performed prior to the initiation of the procedure. The anesthesia team was present to provide general endotracheal tube anesthesia and for patient monitoring during the procedure. Interventional neuro radiology nursing staff was also present. FINDINGS: Initial: Right common carotid artery:  Normal course caliber and contour. Right external carotid artery: Patent with antegrade flow. Right internal carotid artery: Normal course caliber and  contour of the cervical portion. Vertical and petrous segment patent with normal course caliber contour. Cavernous segment patent. Clinoid segment patent. Antegrade flow of the ophthalmic artery. Ophthalmic segment patent. Terminus patent. Fetal right PCA. Right MCA: Acute cut off of the proximal M1 beyond the terminus, with no flow through the segment and no perfusion of the expected territory. Right ACA: A 1 segment patent. A 2 segment perfuses the right territory. Final: After 3 passes with mechanical thrombectomy, there is full restoration of flow through the target MCA segment, TICI 3 flow. Flat panel CT: Flat panel CT demonstrates acute intracranial hemorrhage located away from the site of mechanical thrombectomy/embolectomy. No hemorrhage in the left sylvian region. Hemorrhage located in the pericallosal subarachnoid spaces and within the left para median frontal/parietal parenchyma. Additional site of hemorrhage within the left temporal lobe. Additional site of hemorrhage overlying the right frontal sulci within subarachnoid spaces. PROCEDURE: Patient is brought emergently to the neuro angiography suite, with the patient identified appropriately and placed supine position on the table. Left radial arterial line was placed by the anesthesia team. The patient is then prepped and draped in the usual sterile fashion. Ultrasound survey of the right inguinal region was performed with images stored and sent to PACs. 11 blade scalpel was used to make a small incision. Blunt dissection was performed. A micropuncture needle was used access the right common femoral artery under ultrasound. With excellent arterial blood flow returned, an .018 micro wire was passed through the needle, observed to enter the abdominal aorta under fluoroscopy. The needle was removed, and a micropuncture sheath was placed over the wire. The inner dilator and wire were removed, and an 035 Bentson wire was advanced under fluoroscopy into the  abdominal aorta. The sheath was removed and a standard 5 [place] vascular sheath was placed. The dilator was removed and the sheath was flushed. A 51F JB-1 diagnostic catheter was advanced over the wire to the proximal descending thoracic aorta. Wire was then removed. Double flush of the catheter was performed. Catheter was then used to select the right common carotid artery. Formal angiogram was performed, with roadmap achieved. Exchange length Rosen wire was then passed through the diagnostic catheter to the cervical ICA and the diagnostic catheter was removed. The 5 French sheath was removed and exchanged for 8 French 55 centimeter BrightTip sheath. Sheath was flushed and attached to pressurized and heparinized saline bag for constant forward flow. Then a coaxial intermediate catheter and microcatheter combination was prepared on the back table. This combination was 115cm CAT-5 catheter and a Trevo Provue18 microcatheter, with a synchro soft wire. This combination was then advanced through the balloon guide into the ICA. System was advanced into the internal carotid artery, to the level of the occlusion. The micro wire was then carefully advanced through the occluded segment. Microcatheter was then pushed through the occluded segment and the wire was removed. Intermediate catheter was advanced over the micro wire to the M1 segment. Blood was then aspirated through the hub of the microcatheter, and a gentle contrast injection was performed confirming intraluminal position. A rotating hemostatic valve was then attached to  the back end of the microcatheter, and a pressurized and heparinized saline bag was attached to the catheter. 5 x 33 Embotrap device was then selected. Back flush was achieved at the rotating hemostatic valve, and then the device was gently advanced through the microcatheter to the distal end. The retriever was then unsheathed by withdrawing the microcatheter under fluoroscopy. Once the retriever  was completely unsheathed, the microcatheter was carefully stripped from the delivery wire of the device. Control angiogram was then performed from the balloon catheter. 3 minute time interval was observed. The balloon on the balloon guide was then inflated to profile of the vessel. Constant aspiration was then performed through the intermediate catheter, and constant gentle aspiration was performed at the balloon guide. This aspiration was continued as the retriever was gently and slowly withdrawn with fluoroscopic observation into the distal intermediate catheter, which given the length, was within the intracranial ICA and not the MCA. The entire system was then gently withdrawn from the intracranial ICA and into the balloon guide. Once the retriever was entirely removed from the system, free aspiration was confirmed at the hub of the balloon guide, with free blood return confirmed. Control angiogram was then performed. Persistent occlusion at the proximal MCA was confirmed. The microcatheter and microwire were again used, with a new CAT 5 132 cm length intermediate catheter. Baseline angiogram demonstrated partial reperfusion at the site of the occlusion at this time,TICI 2a. Microwire and microcatheter were again advanced beyond the occlusion, the micro wire was removed and stentreiver device was deployed, stripping the microcatheter from the wire. After the device was deployed across the occlusion, the intermediate catheter was placed at the site of occlusion. The balloon at the balloon guide was inflated to profile of the vessel. Local aspiration was performed at the intermediate catheter upon withdrawal of the device under fluoroscopic observation, with aspiration at the balloon guide. Once the retrieve her was entirely removed from the system, free aspiration was confirmed at the hub of the intermediate catheter, with free blood return confirmed. Control angiogram was again performed. Persistent occlusion at  the dominant MCA branches confirmed, with unchanged TICI 2a flow. The microcatheter and microwire were again used, with the same CAT 5 132 cm length intermediate catheter. Microwire and microcatheter were again advanced beyond the occluded segments, the micro wire was removed and a new stent tree for device was selected. At this time we deployed a 4 mm x 40 mm solitaire platinum device, stripping the microcatheter from the delivery wire. After the device was deployed across the occlusion, the intermediate catheter was placed at the site of occlusion. 3 minutes interval was observed. The balloon at the balloon guide was then inflated to profile of the vessel. At this time, the patient experienced asystole, treated by deflating the balloon for 2 minutes and with pharmacologic treatment from the anesthesia team. After recovery and treatment, the balloon was inflated again without incident. Local aspiration was performed at the intermediate catheter upon withdrawal of the device under fluoroscopic observation, with aspiration at the balloon guide. Once the retrieve her was entirely removed from the system, free aspiration was confirmed at the hub of the intermediate catheter, with free blood return confirmed Balloon guide was removed. Angio-Seal was deployed at the right common femoral artery. Flat panel CT was performed. Estimated blood loss approximately 50 cc. IMPRESSION: Status post ultrasound guided access right common femoral artery for cerebral angiogram and mechanical thrombectomy of acute emergent large vessel occlusion of the right MCA,  achieving full TICI 3 reperfusion of the right MCA target vessel after 3 passes. Intracranial hemorrhage remote from the target vessel was identified on flat panel CT in the patient remained intubated. Signed, Yvone Neu. Reyne Dumas, RPVI [facility] Inter[facility] Radiology Special[facility] Radiology PLAN: Patient remained intubated. Formal CT now Admit to neuro ICU NIR to  follow Electronically Signed   By: Gilmer Mor D.O.   On: 06/08/2018 06:11   Ir US Guide Vasc Access Right  Result Date: 06/08/2018 INDICATION: 84 year old male with a history of acute right MCA stroke, left-sided symptoms. Functional baseline with NIH stroke scale of 23 EXAM: ULTRASOUND GUIDED ACCESS RIGHT COMMON FEMORAL ARTERY CEREBRAL ANGIOGRAM MECHANICAL THROMBECTOMY FOR EMERGENT LARGE VESSEL OCCLUSION RIGHT MCA DEPLOYMENT ANGIO-SEAL FOR HEMOSTASIS LIMITED HEAD CT COMPARISON:  CT imaging same day 05/24/2018 MEDICATIONS: 2 g Ancef. The antibiotic was administered within 1 hour of the procedure ANESTHESIA/SEDATION: General endotracheal tube anesthesia The patient was also continuously monitored during the procedure by the interventional radiology nurse under my direct supervision. CONTRAST:  100 cc [facility] FLUOROSCOPY TIME:  Fluoroscopy Time: 35 minutes 30 seconds (1761 mGy). COMPLICATIONS: Intracranial hemorrhage identified on flat panel CT TECHNIQUE: Informed written consent was obtained from the patient's family after a thorough discussion of the procedural risks, benefits and alternatives. Specific risks discussed include: Bleeding, infection, contrast reaction, kidney injury/failure, need for further procedure/surgery, arterial injury or dissection, embolization to new territory, intracranial hemorrhage (10-15% risk), neurologic deterioration, cardiopulmonary collapse, death. All questions were addressed. Maximal Sterile Barrier Technique was utilized including during the procedure including caps, mask, sterile gowns, sterile gloves, sterile drape, hand hygiene and skin antiseptic. A timeout was performed prior to the initiation of the procedure. The anesthesia team was present to provide general endotracheal tube anesthesia and for patient monitoring during the procedure. Interventional neuro radiology nursing staff was also present. FINDINGS: Initial: Right common carotid artery:  Normal course caliber  and contour. Right external carotid artery: Patent with antegrade flow. Right internal carotid artery: Normal course caliber and contour of the cervical portion. Vertical and petrous segment patent with normal course caliber contour. Cavernous segment patent. Clinoid segment patent. Antegrade flow of the ophthalmic artery. Ophthalmic segment patent. Terminus patent. Fetal right PCA. Right MCA: Acute cut off of the proximal M1 beyond the terminus, with no flow through the segment and no perfusion of the expected territory. Right ACA: A 1 segment patent. A 2 segment perfuses the right territory. Final: After 3 passes with mechanical thrombectomy, there is full restoration of flow through the target MCA segment, TICI 3 flow. Flat panel CT: Flat panel CT demonstrates acute intracranial hemorrhage located away from the site of mechanical thrombectomy/embolectomy. No hemorrhage in the left sylvian region. Hemorrhage located in the pericallosal subarachnoid spaces and within the left para median frontal/parietal parenchyma. Additional site of hemorrhage within the left temporal lobe. Additional site of hemorrhage overlying the right frontal sulci within subarachnoid spaces. PROCEDURE: Patient is brought emergently to the neuro angiography suite, with the patient identified appropriately and placed supine position on the table. Left radial arterial line was placed by the anesthesia team. The patient is then prepped and draped in the usual sterile fashion. Ultrasound survey of the right inguinal region was performed with images stored and sent to PACs. 11 blade scalpel was used to make a small incision. Blunt dissection was performed. A micropuncture needle was used access the right common femoral artery under ultrasound. With excellent arterial blood flow returned, an .018 micro wire was  passed through the needle, observed to enter the abdominal aorta under fluoroscopy. The needle was removed, and a micropuncture sheath was  placed over the wire. The inner dilator and wire were removed, and an 035 Bentson wire was advanced under fluoroscopy into the abdominal aorta. The sheath was removed and a standard 5 [place] vascular sheath was placed. The dilator was removed and the sheath was flushed. A 37F JB-1 diagnostic catheter was advanced over the wire to the proximal descending thoracic aorta. Wire was then removed. Double flush of the catheter was performed. Catheter was then used to select the right common carotid artery. Formal angiogram was performed, with roadmap achieved. Exchange length Rosen wire was then passed through the diagnostic catheter to the cervical ICA and the diagnostic catheter was removed. The 5 French sheath was removed and exchanged for 8 French 55 centimeter BrightTip sheath. Sheath was flushed and attached to pressurized and heparinized saline bag for constant forward flow. Then a coaxial intermediate catheter and microcatheter combination was prepared on the back table. This combination was 115cm CAT-5 catheter and a Trevo Provue18 microcatheter, with a synchro soft wire. This combination was then advanced through the balloon guide into the ICA. System was advanced into the internal carotid artery, to the level of the occlusion. The micro wire was then carefully advanced through the occluded segment. Microcatheter was then pushed through the occluded segment and the wire was removed. Intermediate catheter was advanced over the micro wire to the M1 segment. Blood was then aspirated through the hub of the microcatheter, and a gentle contrast injection was performed confirming intraluminal position. A rotating hemostatic valve was then attached to the back end of the microcatheter, and a pressurized and heparinized saline bag was attached to the catheter. 5 x 33 Embotrap device was then selected. Back flush was achieved at the rotating hemostatic valve, and then the device was gently advanced through the microcatheter  to the distal end. The retriever was then unsheathed by withdrawing the microcatheter under fluoroscopy. Once the retriever was completely unsheathed, the microcatheter was carefully stripped from the delivery wire of the device. Control angiogram was then performed from the balloon catheter. 3 minute time interval was observed. The balloon on the balloon guide was then inflated to profile of the vessel. Constant aspiration was then performed through the intermediate catheter, and constant gentle aspiration was performed at the balloon guide. This aspiration was continued as the retriever was gently and slowly withdrawn with fluoroscopic observation into the distal intermediate catheter, which given the length, was within the intracranial ICA and not the MCA. The entire system was then gently withdrawn from the intracranial ICA and into the balloon guide. Once the retriever was entirely removed from the system, free aspiration was confirmed at the hub of the balloon guide, with free blood return confirmed. Control angiogram was then performed. Persistent occlusion at the proximal MCA was confirmed. The microcatheter and microwire were again used, with a new CAT 5 132 cm length intermediate catheter. Baseline angiogram demonstrated partial reperfusion at the site of the occlusion at this time,TICI 2a. Microwire and microcatheter were again advanced beyond the occlusion, the micro wire was removed and stentreiver device was deployed, stripping the microcatheter from the wire. After the device was deployed across the occlusion, the intermediate catheter was placed at the site of occlusion. The balloon at the balloon guide was inflated to profile of the vessel. Local aspiration was performed at the intermediate catheter upon withdrawal of the device under  fluoroscopic observation, with aspiration at the balloon guide. Once the retrieve her was entirely removed from the system, free aspiration was confirmed at the hub of  the intermediate catheter, with free blood return confirmed. Control angiogram was again performed. Persistent occlusion at the dominant MCA branches confirmed, with unchanged TICI 2a flow. The microcatheter and microwire were again used, with the same CAT 5 132 cm length intermediate catheter. Microwire and microcatheter were again advanced beyond the occluded segments, the micro wire was removed and a new stent tree for device was selected. At this time we deployed a 4 mm x 40 mm solitaire platinum device, stripping the microcatheter from the delivery wire. After the device was deployed across the occlusion, the intermediate catheter was placed at the site of occlusion. 3 minutes interval was observed. The balloon at the balloon guide was then inflated to profile of the vessel. At this time, the patient experienced asystole, treated by deflating the balloon for 2 minutes and with pharmacologic treatment from the anesthesia team. After recovery and treatment, the balloon was inflated again without incident. Local aspiration was performed at the intermediate catheter upon withdrawal of the device under fluoroscopic observation, with aspiration at the balloon guide. Once the retrieve her was entirely removed from the system, free aspiration was confirmed at the hub of the intermediate catheter, with free blood return confirmed Balloon guide was removed. Angio-Seal was deployed at the right common femoral artery. Flat panel CT was performed. Estimated blood loss approximately 50 cc. IMPRESSION: Status post ultrasound guided access right common femoral artery for cerebral angiogram and mechanical thrombectomy of acute emergent large vessel occlusion of the right MCA, achieving full TICI 3 reperfusion of the right MCA target vessel after 3 passes. Intracranial hemorrhage remote from the target vessel was identified on flat panel CT in the patient remained intubated. Signed, Yvone Neu. Reyne Dumas, RPVI Vascular and  Interventional Radiology Specialists [facility] Radiology PLAN: Patient remained intubated. Formal CT now Admit to neuro ICU NIR to follow Electronically Signed   By: Gilmer Mor D.O.   On: 06/08/2018 06:11   Dg Chest Port 1 View  Result Date: 06/10/2018 CLINICAL DATA:  Respiratory failure, endotracheal tube EXAM: PORTABLE CHEST 1 VIEW COMPARISON:  Portable chest x-ray of 06/09/2018 FINDINGS: The tip of the endotracheal tube is approximately 2.5 cm above the carina. There is mild airspace disease at the lung bases and in the right upper lung most consistent with multifocal pneumonia. Feeding tube extends into the left upper quadrant. There is more opacity at the left lung base which may be due to atelectasis and/or pneumonia with probable small effusions. Cardiomegaly is stable. IMPRESSION: 1. Increasing airspace disease in the right upper lobe and at the lung bases left-greater-than-right most consistent with multifocal pneumonia. Suspect small effusions. 2. Tip of endotracheal tube 2.5 cm above the carina. Electronically Signed   By: Dwyane Dee M.D.   On: 06/10/2018 09:36   Dg Chest Port 1 View  Result Date: 06/09/2018 CLINICAL DATA:  Follow-up respiratory failure EXAM: PORTABLE CHEST 1 VIEW COMPARISON:  06/08/2018 FINDINGS: Endotracheal tube is again noted in satisfactory position. Cardiac shadow is stable. Patient is significantly rotated accentuating the mediastinal markings. Mild atelectatic changes are again seen in the base without focal confluent infiltrate or sizable effusion. No bony abnormality is noted. IMPRESSION: Mild atelectatic changes without acute abnormality. Electronically Signed   By: Alcide Clever M.D.   On: 06/09/2018 10:57   Portable Chest Xray  Result Date: 06/08/2018 CLINICAL  DATA:  Respiratory failure. EXAM: PORTABLE CHEST 1 VIEW COMPARISON:  Chest x-ray 05/19/2018.  CT 05/31/2018. FINDINGS: Endotracheal tube noted in stable position. Heart size normal. Bibasilar  atelectasis/infiltrates. Biapical pleural-parenchymal thickening noted possibly related to scarring. Active infiltrates in these regions cannot be excluded. IMPRESSION: 1.  Endotracheal tube in stable position. 2. Bibasilar atelectasis and infiltrates, new from prior. Biapical pleural-parenchymal thickening noted possibly related to scarring. Similar findings noted on prior exam. Active infiltrates in these regions cannot be excluded. Electronically Signed   By: Maisie Fus  Register   On: 06/08/2018 10:18   Dg Chest Port 1 View  Result Date: 05/16/2018 CLINICAL DATA:  84 y/o  M; assess ET tube position. EXAM: PORTABLE CHEST 1 VIEW COMPARISON:  None. FINDINGS: Normal cardiac silhouette. Aortic atherosclerosis with calcification. Endotracheal tube tip projects 4.7 cm above carina. No focal consolidation. No pleural effusion or pneumothorax. No acute osseous abnormality is evident. IMPRESSION: No active disease.  Endotracheal tube tip 4.8 cm above the carina. Electronically Signed   By: Mitzi Hansen M.D.   On: 06/10/2018 22:37   Dg Abd Portable 1v  Result Date: 06/09/2018 CLINICAL DATA:  Feeding tube placement EXAM: PORTABLE ABDOMEN - 1 VIEW COMPARISON:  Portable exam 1158 hours compared to 1021 hours FINDINGS: Feeding tube traverses stomach and duodenal C-loop with tip in LEFT upper quadrant beyond ligament of Treitz. Bowel gas pattern normal. Curved tip catheter within urinary bladder. Bowel gas pattern normal. Degenerative disc disease changes lumbar spine. No acute osseous findings. IMPRESSION: Tip of feeding tube is beyond ligament of Treitz in the LEFT upper quadrant. Electronically Signed   By: Ulyses Southward M.D.   On: 06/09/2018 12:38   Dg Abd Portable 1v  Result Date: 06/09/2018 CLINICAL DATA:  Feeding tube placement EXAM: PORTABLE ABDOMEN - 1 VIEW COMPARISON:  None. FINDINGS: Feeding tube tip is in the gastric cardia. There is no bowel dilatation or air-fluid level to suggest bowel  obstruction. No free air. There is no edema or consolidation in the lung bases. IMPRESSION: Feeding tube tip in gastric cardia. No bowel obstruction or free air evident. Electronically Signed   By: Bretta Bang III M.D.   On: 06/09/2018 10:50   Ir Percutaneous Art Thrombectomy/infusion Intracranial Inc Diag Angio  Result Date: 06/08/2018 INDICATION: 84 year old male with a history of acute right MCA stroke, left-sided symptoms. Functional baseline with NIH stroke scale of 23 EXAM: ULTRASOUND GUIDED ACCESS RIGHT COMMON FEMORAL ARTERY CEREBRAL ANGIOGRAM MECHANICAL THROMBECTOMY FOR EMERGENT LARGE VESSEL OCCLUSION RIGHT MCA DEPLOYMENT ANGIO-SEAL FOR HEMOSTASIS LIMITED HEAD CT COMPARISON:  CT imaging same day 05/21/2018 MEDICATIONS: 2 g Ancef. The antibiotic was administered within 1 hour of the procedure ANESTHESIA/SEDATION: General endotracheal tube anesthesia The patient was also continuously monitored during the procedure by the interventional radiology nurse under my direct supervision. CONTRAST:  100 cc [facility] FLUOROSCOPY TIME:  Fluoroscopy Time: 35 minutes 30 seconds (1761 mGy). COMPLICATIONS: Intracranial hemorrhage identified on flat panel CT TECHNIQUE: Informed written consent was obtained from the patient's family after a thorough discussion of the procedural risks, benefits and alternatives. Specific risks discussed include: Bleeding, infection, contrast reaction, kidney injury/failure, need for further procedure/surgery, arterial injury or dissection, embolization to new territory, intracranial hemorrhage (10-15% risk), neurologic deterioration, cardiopulmonary collapse, death. All questions were addressed. Maximal Sterile Barrier Technique was utilized including during the procedure including caps, mask, sterile gowns, sterile gloves, sterile drape, hand hygiene and skin antiseptic. A timeout was performed prior to the initiation of the procedure. The anesthesia team was  present to provide general  endotracheal tube anesthesia and for patient monitoring during the procedure. Interventional neuro radiology nursing staff was also present. FINDINGS: Initial: Right common carotid artery:  Normal course caliber and contour. Right external carotid artery: Patent with antegrade flow. Right internal carotid artery: Normal course caliber and contour of the cervical portion. Vertical and petrous segment patent with normal course caliber contour. Cavernous segment patent. Clinoid segment patent. Antegrade flow of the ophthalmic artery. Ophthalmic segment patent. Terminus patent. Fetal right PCA. Right MCA: Acute cut off of the proximal M1 beyond the terminus, with no flow through the segment and no perfusion of the expected territory. Right ACA: A 1 segment patent. A 2 segment perfuses the right territory. Final: After 3 passes with mechanical thrombectomy, there is full restoration of flow through the target MCA segment, TICI 3 flow. Flat panel CT: Flat panel CT demonstrates acute intracranial hemorrhage located away from the site of mechanical thrombectomy/embolectomy. No hemorrhage in the left sylvian region. Hemorrhage located in the pericallosal subarachnoid spaces and within the left para median frontal/parietal parenchyma. Additional site of hemorrhage within the left temporal lobe. Additional site of hemorrhage overlying the right frontal sulci within subarachnoid spaces. PROCEDURE: Patient is brought emergently to the neuro angiography suite, with the patient identified appropriately and placed supine position on the table. Left radial arterial line was placed by the anesthesia team. The patient is then prepped and draped in the usual sterile fashion. Ultrasound survey of the right inguinal region was performed with images stored and sent to PACs. 11 blade scalpel was used to make a small incision. Blunt dissection was performed. A micropuncture needle was used access the right common femoral artery under  ultrasound. With excellent arterial blood flow returned, an .018 micro wire was passed through the needle, observed to enter the abdominal aorta under fluoroscopy. The needle was removed, and a micropuncture sheath was placed over the wire. The inner dilator and wire were removed, and an 035 Bentson wire was advanced under fluoroscopy into the abdominal aorta. The sheath was removed and a standard 5 [place] vascular sheath was placed. The dilator was removed and the sheath was flushed. A 61F JB-1 diagnostic catheter was advanced over the wire to the proximal descending thoracic aorta. Wire was then removed. Double flush of the catheter was performed. Catheter was then used to select the right common carotid artery. Formal angiogram was performed, with roadmap achieved. Exchange length Rosen wire was then passed through the diagnostic catheter to the cervical ICA and the diagnostic catheter was removed. The 5 French sheath was removed and exchanged for 8 French 55 centimeter BrightTip sheath. Sheath was flushed and attached to pressurized and heparinized saline bag for constant forward flow. Then a coaxial intermediate catheter and microcatheter combination was prepared on the back table. This combination was 115cm CAT-5 catheter and a Trevo Provue18 microcatheter, with a synchro soft wire. This combination was then advanced through the balloon guide into the ICA. System was advanced into the internal carotid artery, to the level of the occlusion. The micro wire was then carefully advanced through the occluded segment. Microcatheter was then pushed through the occluded segment and the wire was removed. Intermediate catheter was advanced over the micro wire to the M1 segment. Blood was then aspirated through the hub of the microcatheter, and a gentle contrast injection was performed confirming intraluminal position. A rotating hemostatic valve was then attached to the back end of the microcatheter, and a pressurized and  heparinized  saline bag was attached to the catheter. 5 x 33 Embotrap device was then selected. Back flush was achieved at the rotating hemostatic valve, and then the device was gently advanced through the microcatheter to the distal end. The retriever was then unsheathed by withdrawing the microcatheter under fluoroscopy. Once the retriever was completely unsheathed, the microcatheter was carefully stripped from the delivery wire of the device. Control angiogram was then performed from the balloon catheter. 3 minute time interval was observed. The balloon on the balloon guide was then inflated to profile of the vessel. Constant aspiration was then performed through the intermediate catheter, and constant gentle aspiration was performed at the balloon guide. This aspiration was continued as the retriever was gently and slowly withdrawn with fluoroscopic observation into the distal intermediate catheter, which given the length, was within the intracranial ICA and not the MCA. The entire system was then gently withdrawn from the intracranial ICA and into the balloon guide. Once the retriever was entirely removed from the system, free aspiration was confirmed at the hub of the balloon guide, with free blood return confirmed. Control angiogram was then performed. Persistent occlusion at the proximal MCA was confirmed. The microcatheter and microwire were again used, with a new CAT 5 132 cm length intermediate catheter. Baseline angiogram demonstrated partial reperfusion at the site of the occlusion at this time,TICI 2a. Microwire and microcatheter were again advanced beyond the occlusion, the micro wire was removed and stentreiver device was deployed, stripping the microcatheter from the wire. After the device was deployed across the occlusion, the intermediate catheter was placed at the site of occlusion. The balloon at the balloon guide was inflated to profile of the vessel. Local aspiration was performed at the  intermediate catheter upon withdrawal of the device under fluoroscopic observation, with aspiration at the balloon guide. Once the retrieve her was entirely removed from the system, free aspiration was confirmed at the hub of the intermediate catheter, with free blood return confirmed. Control angiogram was again performed. Persistent occlusion at the dominant MCA branches confirmed, with unchanged TICI 2a flow. The microcatheter and microwire were again used, with the same CAT 5 132 cm length intermediate catheter. Microwire and microcatheter were again advanced beyond the occluded segments, the micro wire was removed and a new stent tree for device was selected. At this time we deployed a 4 mm x 40 mm solitaire platinum device, stripping the microcatheter from the delivery wire. After the device was deployed across the occlusion, the intermediate catheter was placed at the site of occlusion. 3 minutes interval was observed. The balloon at the balloon guide was then inflated to profile of the vessel. At this time, the patient experienced asystole, treated by deflating the balloon for 2 minutes and with pharmacologic treatment from the anesthesia team. After recovery and treatment, the balloon was inflated again without incident. Local aspiration was performed at the intermediate catheter upon withdrawal of the device under fluoroscopic observation, with aspiration at the balloon guide. Once the retrieve her was entirely removed from the system, free aspiration was confirmed at the hub of the intermediate catheter, with free blood return confirmed Balloon guide was removed. Angio-Seal was deployed at the right common femoral artery. Flat panel CT was performed. Estimated blood loss approximately 50 cc. IMPRESSION: Status post ultrasound guided access right common femoral artery for cerebral angiogram and mechanical thrombectomy of acute emergent large vessel occlusion of the right MCA, achieving full TICI 3  reperfusion of the right MCA target vessel  after 3 passes. Intracranial hemorrhage remote from the target vessel was identified on flat panel CT in the patient remained intubated. Signed, Yvone Neu. Reyne Dumas, RPVI [facility] Radiology PLAN: Patient remained intubated. Formal CT now Admit to neuro ICU NIR to follow Electronically Signed   By: Gilmer Mor D.O.   On: 06/08/2018 06:11   Ct Head Code Stroke Wo Contrast  Result Date: 05/31/2018 CLINICAL DATA:  Code stroke.  Left-sided weakness and slurred speech EXAM: CT HEAD WITHOUT CONTRAST TECHNIQUE: Contiguous axial images were obtained from the base of the skull through the vertex without intravenous contrast. COMPARISON:  Head CT 05/28/2018 FINDINGS: Brain: There is no mass, hemorrhage or extra-axial collection. The size and configuration of the ventricles and extra-axial CSF spaces are normal. 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 No evidence of acute cortical infarct. There is hypoattenuation of the periventricular white matter, most commonly indicating chronic ischemic microangiopathy. Vascular: No abnormal hyperdensity of the major intracranial arteries or dural venous sinuses. No intracranial atherosclerosis. Skull: The visualized skull base, calvarium and extracranial soft tissues are normal. Sinuses/Orbits: No fluid levels or advanced mucosal thickening of the visualized paranasal sinuses. No mastoid or middle ear effusion. The orbits are normal. ASPECTS [facility] Stroke Program Early CT Score) - Ganglionic level infarction (caudate, lentiform nuclei, internal capsule, insula, M1-M3 cortex): 7 - Supraganglionic infarction (M4-M6 cortex): 3 Total score (0-10 with 10 being normal): 10 IMPRESSION: 1. No acute hemorrhage or mass lesion. 2. ASPECTS is 10. 3. 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 These results were called by telephone at the time of interpretation on  05/25/2018 at 4:09 pm to Dr. Caryl Pina , who verbally acknowledged these results. Electronically Signed   By: Deatra Robinson M.D.   On: 06/11/2018 16:24     Procedures/Operations  06/11/2018 Mechanical thrombectomy right MCA with TICI3 reperfusion.    Marvel Plan 07-06-2018, 2:14 PM

## 2018-07-16 NOTE — Social Work (Addendum)
CSW acknowledging consult for referral to Hospice Home. Have re-referred pt to [facility] who is familiar with family from previous referral. Await information regarding bed availability.  11:58am- received call from bedside RN, per MD pt not stable at this time for transport. Inpatient hospice request cancelled.  Doy Hutching, LCSWA [facility] Clinical Social Work [phone]

## 2018-07-16 NOTE — Progress Notes (Signed)
Daily Progress Note   Patient Name: Justin [name]       Date: 07-12-2018 DOB: 05/27/1928  Age: 84 y.o. MRN#: [id number] Attending Physician: Marvel Plan, MD Primary Care Physician: Patient, No Pcp Per Admit Date: 05/21/2018  Reason for Consultation/Follow-up: Hospice Evaluation and Psychosocial/spiritual support  Subjective: Patient unresponsive, appears comfortable - regular respirations, no family at bedside  Length of Stay: 16  Current Medications: Scheduled Meds:    Continuous Infusions: . morphine 4 mg/hr (July 12, 2018 0550)    PRN Meds: bisacodyl, glycopyrrolate **OR** glycopyrrolate **OR** glycopyrrolate, LORazepam, morphine  Physical Exam     Constitutional: No distress.  Pulmonary/Chest:  Irregular Abdominal: Soft.  Musculoskeletal: He exhibits no edema.  Neurological: He is unresponsive.  Skin: He is not diaphoretic.  Cool extremities    Vital Signs: BP 133/79 (BP Location: Right Arm)   Pulse (!) 106   Temp (!) 97.5 F (36.4 C) (Oral)   Resp 18   Ht 5\' 10"  (1.778 m)   Wt 79.5 kg   SpO2 94%   BMI 25.15 kg/m  SpO2: SpO2: 94 % O2 Device: O2 Device: Nasal Cannula O2 Flow Rate: O2 Flow Rate (L/min): 2 L/min  Intake/output summary:   Intake/Output Summary (Last 24 hours) at 07/12/2018 0913 Last data filed at 12-Jul-2018 0550 Gross per 24 hour  Intake 95.32 ml  Output 725 ml  Net -629.68 ml   LBM: Last BM Date: 06/14/18 Baseline Weight: Weight: 78.1 kg Most recent weight: Weight: 79.5 kg       Palliative Assessment/Data: PPS 10%    Flowsheet Rows     Most Recent Value  Intake Tab  Referral Department  Neurology  Unit at Time of Referral  Med/Surg Unit  Palliative Care Primary Diagnosis  Neurology  Date Notified  06/19/18  Palliative Care Type  New  Palliative care  Reason for referral  Clarify Goals of Care  Date of Admission  05/17/2018  Date first seen by Palliative Care  06/21/18  # of days Palliative referral response time  2 Day(s)  # of days IP prior to Palliative referral  12  Clinical Assessment  Palliative Performance Scale Score  10%  Psychosocial & Spiritual Assessment  Palliative Care Outcomes  Patient/Family meeting held?  Yes  Who was at the meeting?  son  Palliative Care Outcomes  Clarified goals of care, Provided end of life care assistance      Patient Active Problem List   Diagnosis Date Noted  . Acute respiratory failure (HCC)   . Comfort measures only status   . Goals of care, counseling/discussion   . Palliative care by specialist   . Malnutrition of moderate degree 06/10/2018  . Acute embolic stroke (HCC) 06/14/2018  . Endotracheal tube present   . Generalized weakness   . Venous stasis ulcer (HCC) 05/28/2018  . Cellulitis of multiple sites of lower extremity 05/28/2018  . Erysipelas 09/30/2012  . DVT of lower extremity (deep venous thrombosis) (HCC) 09/28/2012  . Hematuria 09/26/2012  . Cellulitis 09/25/2012  . Venous stasis dermatitis 09/25/2012    Palliative Care Assessment & Plan   HPI: 84 y.o. male  with past medical history of venous stasis ulcers of both lower  extremities,  DVT (was on xarelto but recently stopped after lower ext cellulitis), former smoker, and BPH with chronic catheter admitted on 2018-07-02 with aphasia, left sided facial droop and left sided weakness. Diagnose with R large MCA infarct - tPA given and mechanical thrombectomy performed. Developed ICH, SAH, IVH after procedure. Intubated for airway protection. Patient was terminally extubated the evening of 10/1. Patient has been receiving comfort measures since. PMT consulted as family was having difficulty deciding about transfer to residential hospice.  Assessment: Follow up on patient today. Remains comfortable. Remains  on morphine 4 mg/hr infusion. Per chart review patient has not required boluses or prns. Breathing continues to appear regular. No periods of apnea noted during my exam.   Called son and provided update yesterday. Discussed that patient appears stable for transfer to hospice home. Son is open to this; however, tells me that transfer was difficult before because of paperwork and he is out of town. If it can be arranged, he agrees to hospice home transfer.  Recommendations/Plan:  Continue comfort care and morphine infusion - oxygen removed  Son is open to transfer to hospice home but had difficulties with paperwork previously? He would prefer hospital death but understands hospice home may be more appropriate - CSW consulted  Goals of Care and Additional Recommendations:  Limitations on Scope of Treatment: Full Comfort Care  Code Status:  DNR  Prognosis:   Hours - Days  Discharge Planning:  Anticipated Hospital Death vs hospice home  Thank you for allowing the Palliative Medicine Team to assist in the care of this patient.   Total Time 15 minutes Prolonged Time Billed  no       Greater than 50%  of this time was spent counseling and coordinating care related to the above assessment and plan.  Gerlean Ren, DNP, [facility] Palliative Medicine Team Team Phone # [phone]  Pager [phone]

## 2018-07-16 NOTE — Progress Notes (Signed)
STROKE TEAM PROGRESS NOTE   SUBJECTIVE (INTERVAL HISTORY) No family is at the bedside. Pt remains on morphine drip. Breathing seems more agonal today, but still not in discomfort.   OBJECTIVE Temp:  [97.5 F (36.4 C)] 97.5 F (36.4 C) (10/09 0513) Pulse Rate:  [106] 106 (10/09 0513) Resp:  [13-18] 18 (10/09 0513) BP: (133)/(79) 133/79 (10/09 0513) SpO2:  [94 %] 94 % (10/09 0513)   IMAGING  Ct Angio Head W Or Wo Contrast Ct Angio Neck W Or Wo Contrast 06/14/2018 IMPRESSION:  1. Emergent large vessel occlusion of the right middle cerebral artery M1 segment with minimal collateral flow demonstrated in the right MCA territory.  2. Biapical pulmonary opacities most consistent with fibrosis or scarring. A superimposed mass or consolidation would be difficult to exclude. If prior chest CTs exists, comparison would be helpful.  3. No arterial occlusion or hemodynamically significant stenosis of the major arteries of the neck.    Ct Head Wo Contrast 05/28/2018 IMPRESSION:  1.  No acute intracranial abnormality.  2. Atrophy with moderate to advanced chronic small vessel ischemia.    Ct Head Wo Contrast 06/02/2018 IMPRESSION:  1. Multiple new intraparenchymal hematomas with hematocrit levels consistent with acute and suspected active hemorrhage.  2. LEFT lateral intraventricular hemorrhage, possible parenchymal extension.  3. Scattered extra-axial blood products, confounded by postcontrast imaging.   Ct Head Code Stroke Wo Contrast 05/16/2018 IMPRESSION:  1. No acute hemorrhage or mass lesion.  2. ASPECTS is 10.  3. 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    Ct Head Wo Contrast 06/08/2018 IMPRESSION:  1. Interval decompression of left paramedian frontal lobe brain parenchymal hemorrhage into the ventricular system with increased blood products in both lateral ventricular atria. Stable ventricle size.  2. Otherwise stable brain parenchymal  hemorrhage in paramedian frontal, parietal, and occipital lobes as well as the left temporal lobe.  3. Stable moderate volume of subarachnoid hemorrhage over convexities and cerebellum.  4. No new acute intracranial abnormality identified.    Ct Head Wo Contrast 06/11/2018 IMPRESSION:  1. Interval decrease in size of multifocal intraparenchymal cerebral hemorrhages, with decreased volume of subarachnoid and intraventricular hemorrhage. Stable ventricular size.  2. Continued interval evolution of large right MCA territory infarct, stable in size and distribution relative to recent MRI. No evidence for hemorrhagic transformation or significant mass effect.  3. No other new acute intracranial abnormality.    MRI and MRA 06/08/2018 1. Acute large right MCA infarct primarily involving cortex and basal ganglia. 2. Bilateral parenchymal hemorrhages, subarachnoid hemorrhage, and intraventricular hemorrhage, not significantly changed from today's earlier CT. 3. Moderate cerebral atrophy and chronic small vessel ischemic disease. 4. Negative head MRA. Continued patency of the revascularized right MCA.   Dg Chest Port 1 View 06/04/2018 IMPRESSION:  No active disease.  Endotracheal tube tip 4.8 cm above the carina.    TTE - Technically difficult study. LVEF 65-70%, moderate LVH, normal   wall motion, mild aortic stenosis - mean gradient of 12 mmHg -   AVA around 1.7 cm2, trace to mild MR, normal LA size, dilated   IVC.   LE venous doppler Right: There is no evidence of deep vein thrombosis in the lower extremity. However, portions of this examination were limited- see technologist comments above. Left: There is no evidence of deep vein thrombosis in the lower extremity. However, portions of this examination were limited- see technologist comments above.     PHYSICAL EXAM  Temp:  [97.5  F (36.4 C)] 97.5 F (36.4 C) (10/09 0513) Pulse Rate:  [106] 106 (10/09 0513) Resp:  [13-18] 18  (10/09 0513) BP: (133)/(79) 133/79 (10/09 0513) SpO2:  [94 %] 94 % (10/09 0513)  General - Frail elderly Caucasian male not in distress.  Ophthalmologic - fundi not visualized due to comfort care.  Cardiovascular - Regular rate and rhythm.  Neuro - limited exam due to comfort care. Eyes closed, does not open on voice. Not responding to greetings or commands. Facial symmetric. More prominent agonal breathing today, still on morphine drip. No spontaneous movement, no distress. Sensation, coordination and gait not tested due to comfort care.   ASSESSMENT/PLAN Justin [name] is a 84 y.o. male with history of DVT on Xarelto but stopped recently after LE cellulitis, BPH on foley placed recently in [facility] admitted for aphasia, left facial droop, left weakness and neglect. tPA given and mechanical thrombectomy  Stroke:  right large MCA infarct mainly at cortex and BG due to right M1 occlusion s/p tPA and IR with TICI3 reperfusion, embolic pattern, source unclear  Resultant intubated and left sided weakness and right gaze preference  CT head no acute finding  CTA head and neck - right M1 occlusion  DSA - right M1 occlusion s/p TICI3 reperfusion  MRI  right large MCA infarct mainly at cortex and BG   MRA unremarkable after IR  2D Echo EF 65-70%  LDL 61  HgbA1c 6.2  Under comfort care now - continue morphine drip  Appreciate palliative care consult - more agonal breathing this am - not candidate for residential hospice at this time - will remain as inpt at this time.  ICH with IVH likely due to tPA s/p reversal  tPA reversed with TXA and cryo  CT post procedure showed b/l frontal ICH, left BG and temporal ICH with IVH and SAH  Repeat CT stable with left frontal/BG ICH decompressed to lateral ventricle  MRI confirmed ICH, IVH and SAH, no hydrocephalus. No significant CAA either.   Fibrinogen 370->446  Repeat CT 06/11/18 stable hematoma, infarct and ventricle size, blood  product less than before  LE venous stasis with ulcers and cellulitis and hx of DVT  Wound care on board  Has hx of DVT was on Xarelto at home but it was discontinued on recent discharge on 06/02/18.   LE venous doppler no DVT this admission  Off rocephin   Blood culture neg  Leukocytosis and fever - resolved  Blood culture neg  UA WBC 6-10  Urine culture neg  WBC 14->12.1->12.7->11.3->11.0->10.5  CXR 06/10/18 likely multifocal pneumonia  Now off rocephin (started 06/24/18)   Short run of V-tach and transient cardiac arrest  Pt also had brief cardiac arrest during IR procedure and under anesthesia - reversed on its own  Captured on tele 06/10/18. Resolved on its own  Hypertension/hypotension . Stable  On comfort care  Other Stroke Risk Factors  Advanced age  Other Active Problems  BPH - on foley catheter   Taken off O2 and NG tube  Hospital day # 16   Marvel Plan, MD PhD Stroke Neurology 07/10/2018 12:07 PM   To contact Stroke Continuity provider, please refer to [email or web address]. After hours, contact General Neurology

## 2018-07-16 DEATH — deceased

## 2019-10-12 IMAGING — CT CT HEAD W/O CM
4 series · 16 of 47 positions shown, 18 images · non-contrast
Comparison: 06/07/2018 CT head.

CLINICAL DATA: 89 y/o  M; follow-up of intracranial hemorrhage.

EXAM:
CT HEAD WITHOUT CONTRAST
TECHNIQUE: Contiguous axial images were obtained from the base of the skull
through the vertex without intravenous contrast.

[Series 3: head wo · axial · 0.42mm/px · z∈[+1164,+1294]mm · 7 of 36 slices shown, 9 images]
[im 5/36  brain]
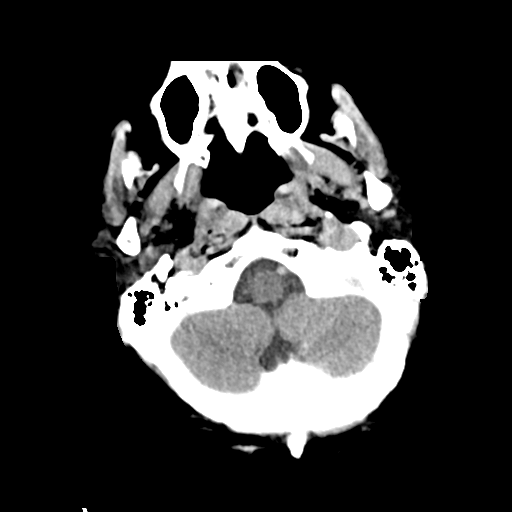
[im 5/36  bone]
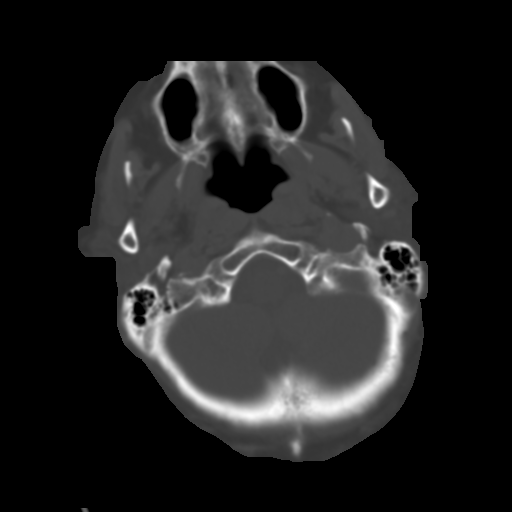
[im 9/36  brain]
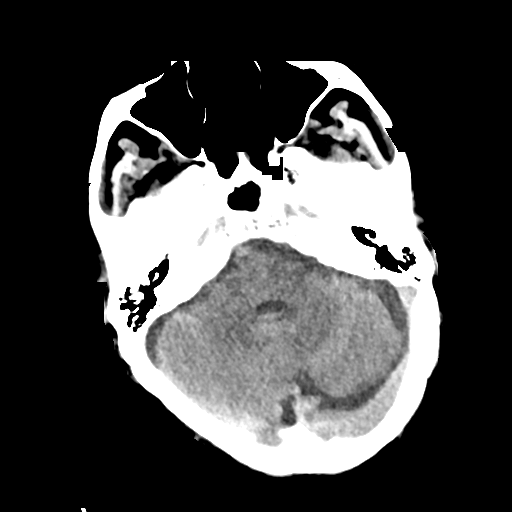
[im 14/36  brain]
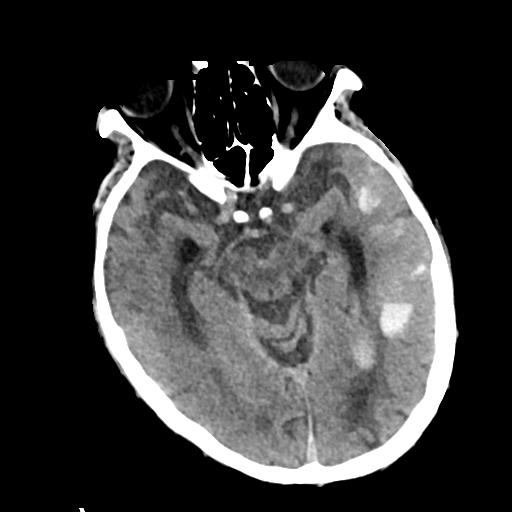
[im 18/36  brain]
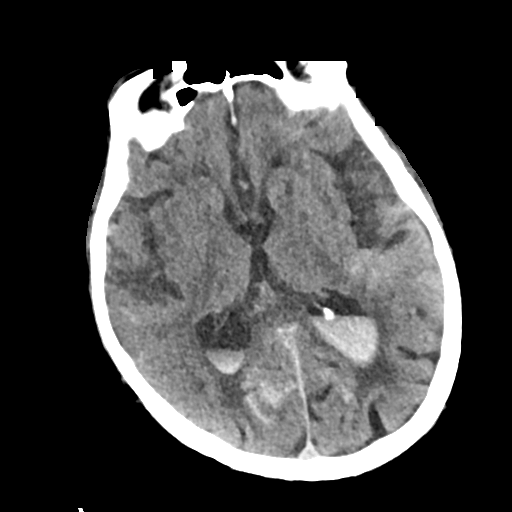
[im 22/36  brain]
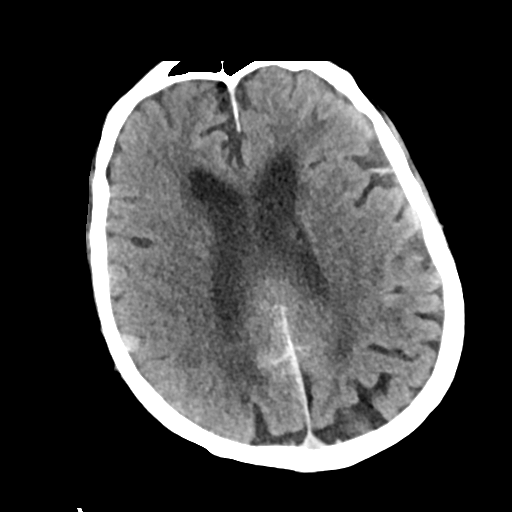
[im 22/36  bone]
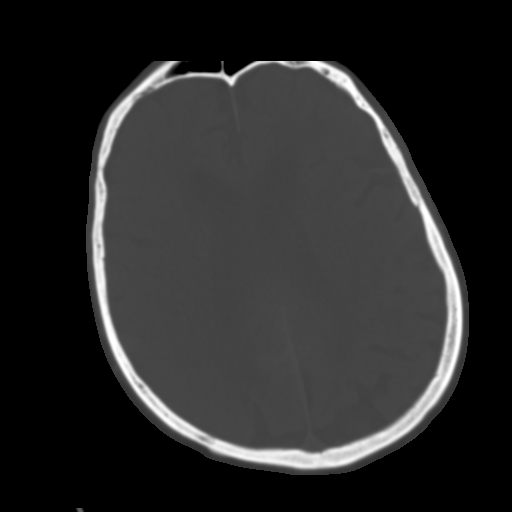
[im 27/36  brain]
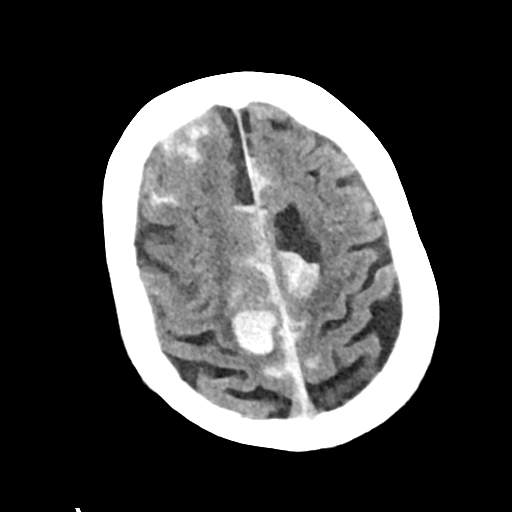
[im 31/36  brain]
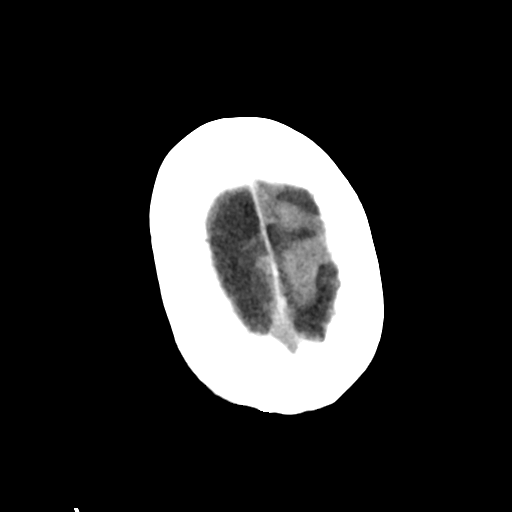

[Series 4: head bone · axial · 0.42mm/px · z∈[+1160,+1196]mm · 3 of 88 slices shown]
[im 9/88  bone]
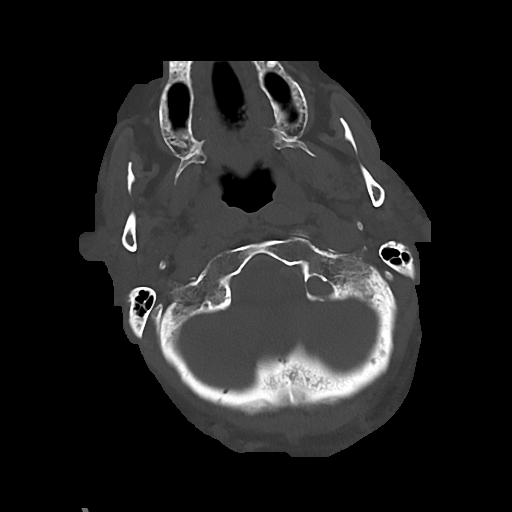
[im 18/88  bone]
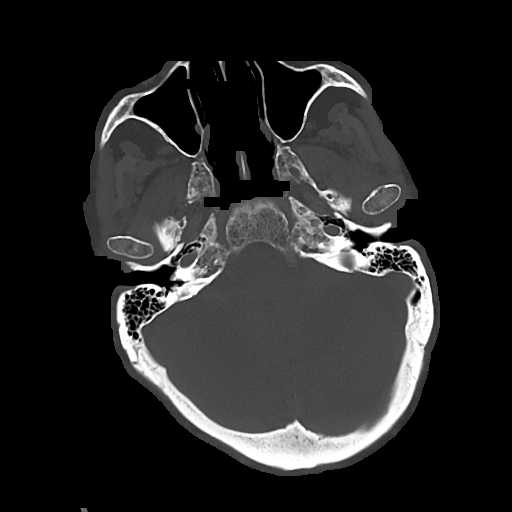
[im 27/88  bone]
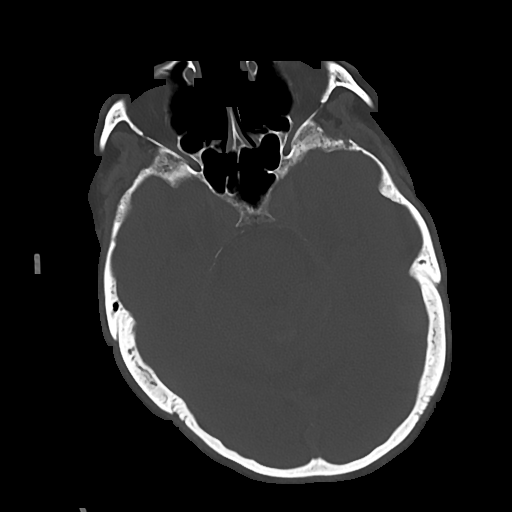

[Series 5: cor soft · coronal · 0.34mm/px · 3 of 71 slices shown]
[im 24/71  brain]
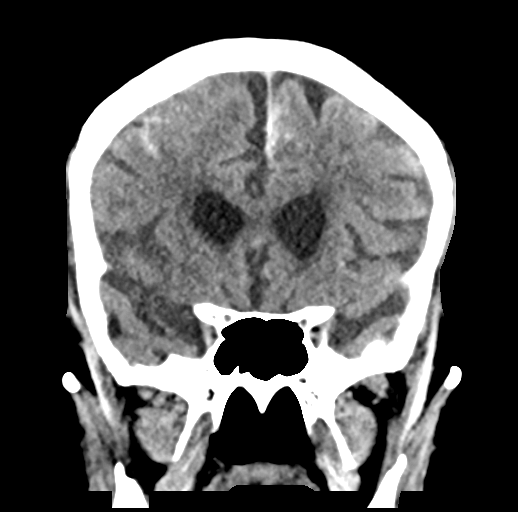
[im 32/71  brain]
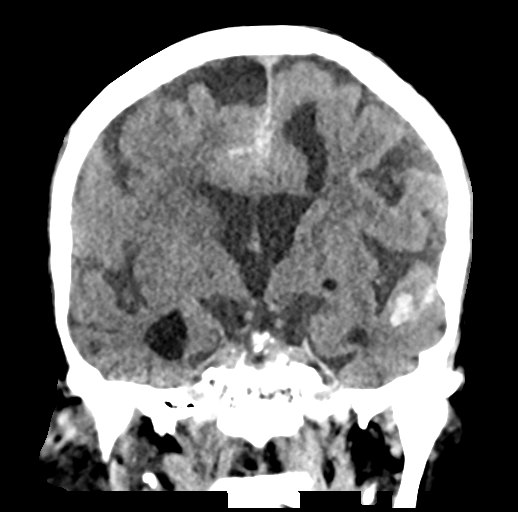
[im 39/71  brain]
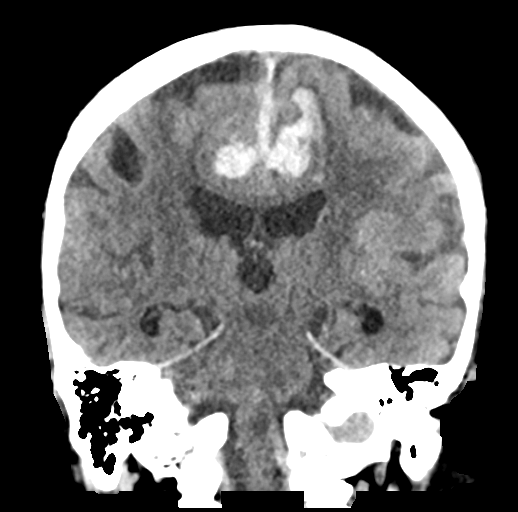

[Series 6: sag soft · sagittal · 0.34mm/px · 3 of 58 slices shown]
[im 20/58  brain]
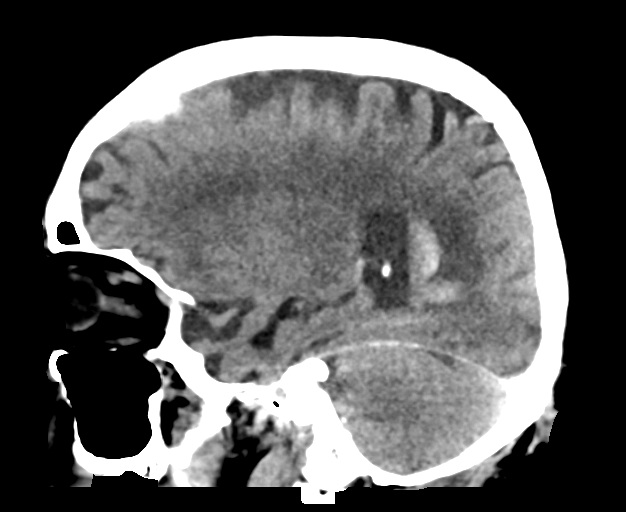
[im 29/58  brain]
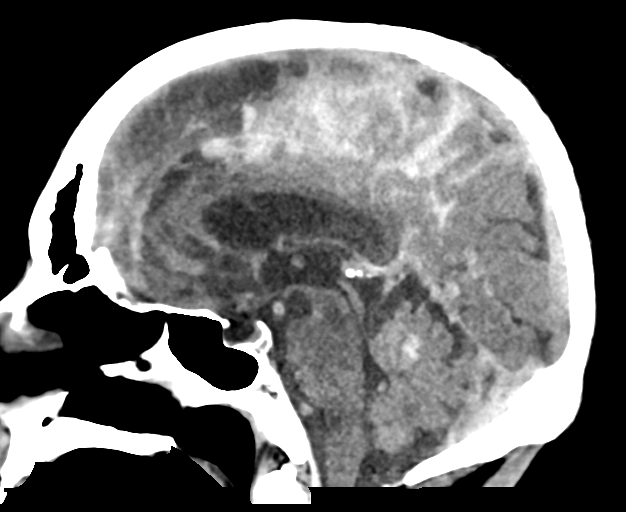
[im 39/58  brain]
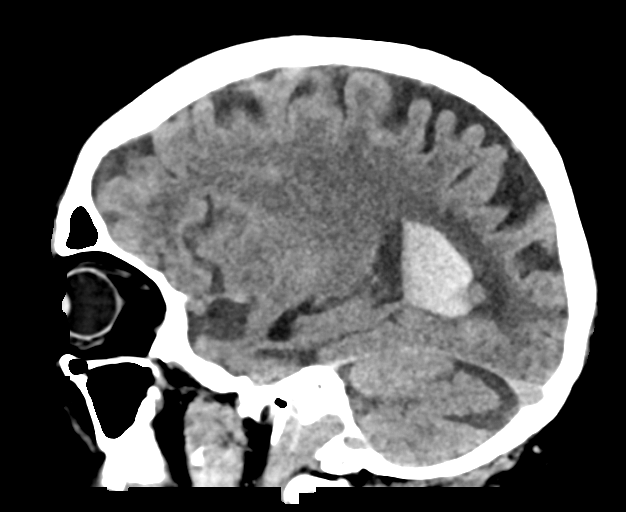

[16 of 47 positions shown; findings below may reference images not displayed]

FINDINGS: Brain: Multiple brain parenchymal hemorrhages within the paramedian
frontal, parietal, and occipital lobes as well as the left temporal
lobes with hematocrit levels. Large area of hemorrhage within the
left paramedian posterior frontal cortex has decompressed into the
ventricular system in the interim. Brain parenchymal hemorrhage is
otherwise stable.

Stable moderate volume of diffuse subarachnoid hemorrhage over
cerebral convexities and cerebellum.

Mild interval increase in intraventricular hemorrhage likely
redistributed from the left frontal parenchymal hematoma as above
within new blood fluid level in the right lateral ventricle atria.
Ventricle size is stable.

No new acute intracranial hemorrhage, stroke, or focal mass effect.
No herniation. Stable background of chronic microvascular ischemic
changes and volume loss of the brain.

Vascular: Calcific atherosclerosis of the carotid siphons.

Skull: Normal. Negative for fracture or focal lesion.

Sinuses/Orbits: No acute finding.

Other: None.
IMPRESSION: 1. Interval decompression of left paramedian frontal lobe brain
parenchymal hemorrhage into the ventricular system with increased
blood products in both lateral ventricular atria. Stable ventricle
size.
2. Otherwise stable brain parenchymal hemorrhage in paramedian
frontal, parietal, and occipital lobes as well as the left temporal
lobe.
3. Stable moderate volume of subarachnoid hemorrhage over
convexities and cerebellum.
4. No new acute intracranial abnormality identified.

By: Jaleesa Harari M.D.

## 2019-10-13 IMAGING — DX DG CHEST 1V PORT
1 series · 1 of 1 positions shown · non-contrast
Comparison: 06/08/2018

CLINICAL DATA: Follow-up respiratory failure

EXAM:
PORTABLE CHEST 1 VIEW

[chest]
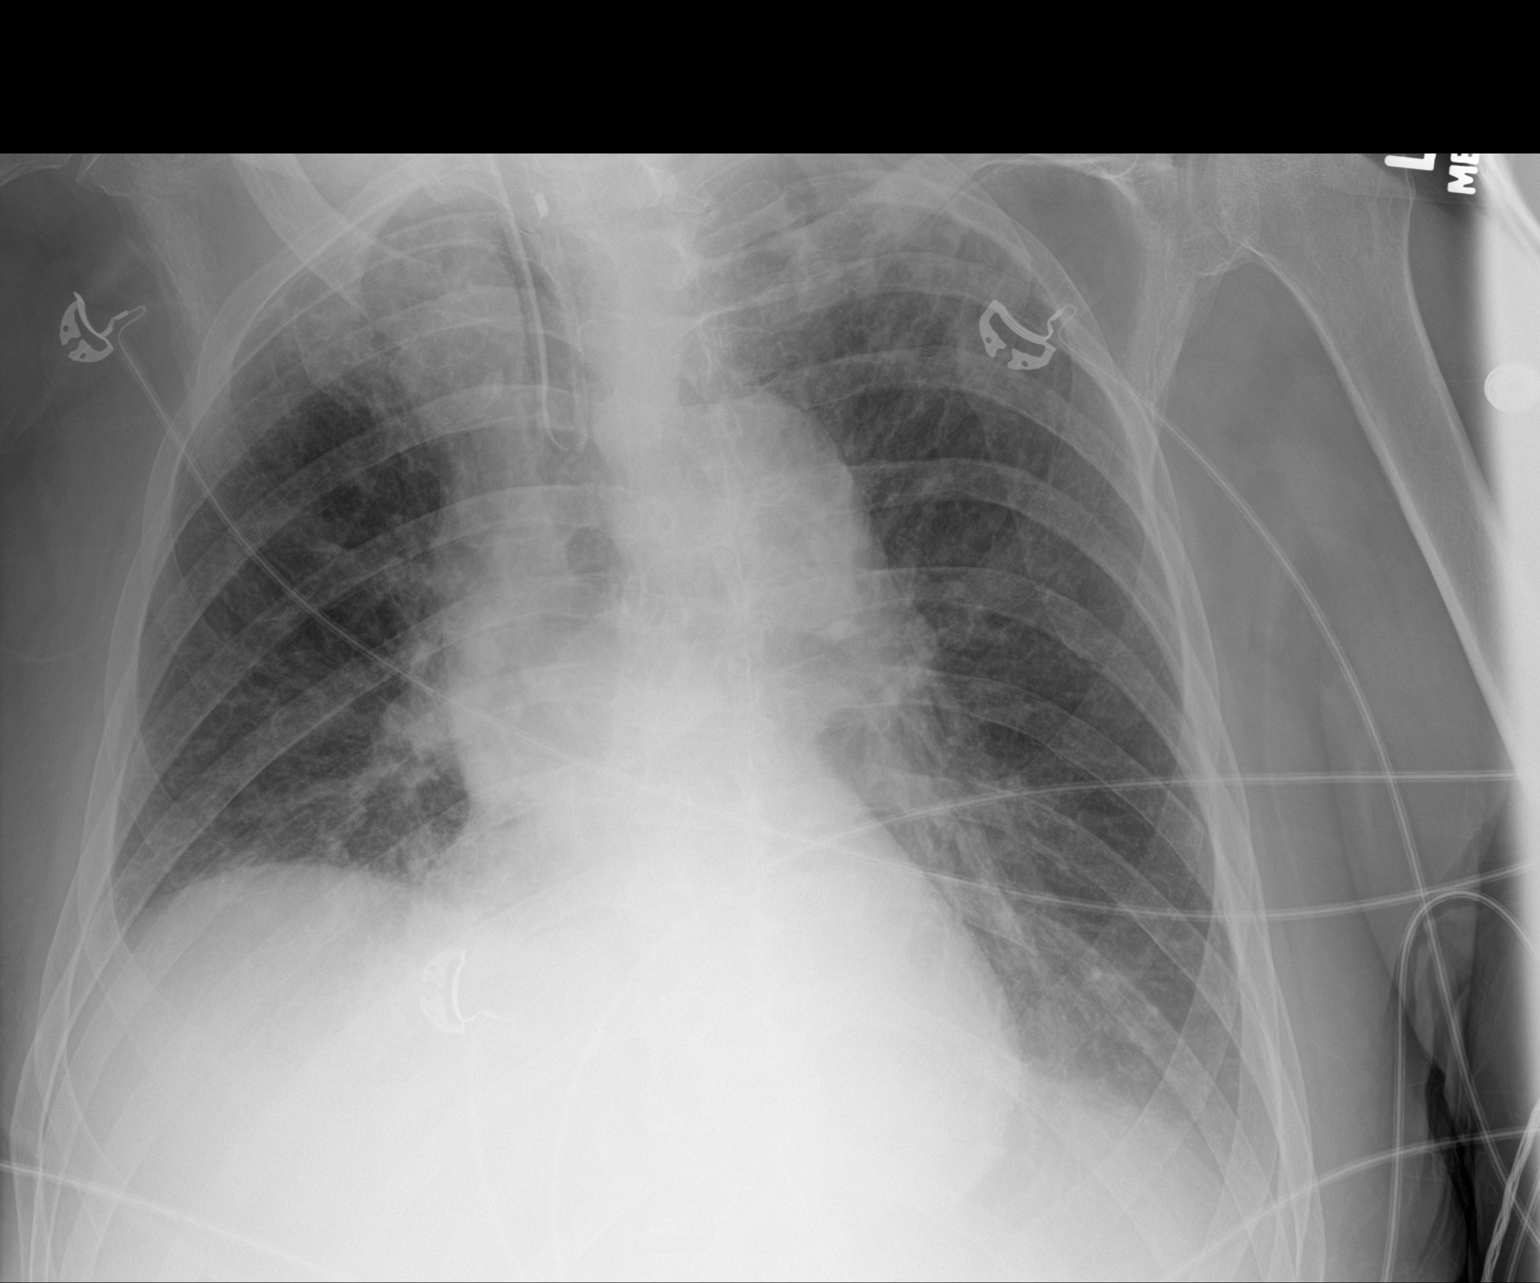

[1 of 1 positions shown; findings below may reference images not displayed]

FINDINGS: Endotracheal tube is again noted in satisfactory position. Cardiac
shadow is stable. Patient is significantly rotated accentuating the
mediastinal markings. Mild atelectatic changes are again seen in the
base without focal confluent infiltrate or sizable effusion. No bony
abnormality is noted.
IMPRESSION: Mild atelectatic changes without acute abnormality.

## 2019-10-14 IMAGING — DX DG CHEST 1V PORT
1 series · 1 of 1 positions shown · non-contrast
Comparison: Portable chest x-ray of 06/09/2018

CLINICAL DATA: Respiratory failure, endotracheal tube

EXAM:
PORTABLE CHEST 1 VIEW

[chest]
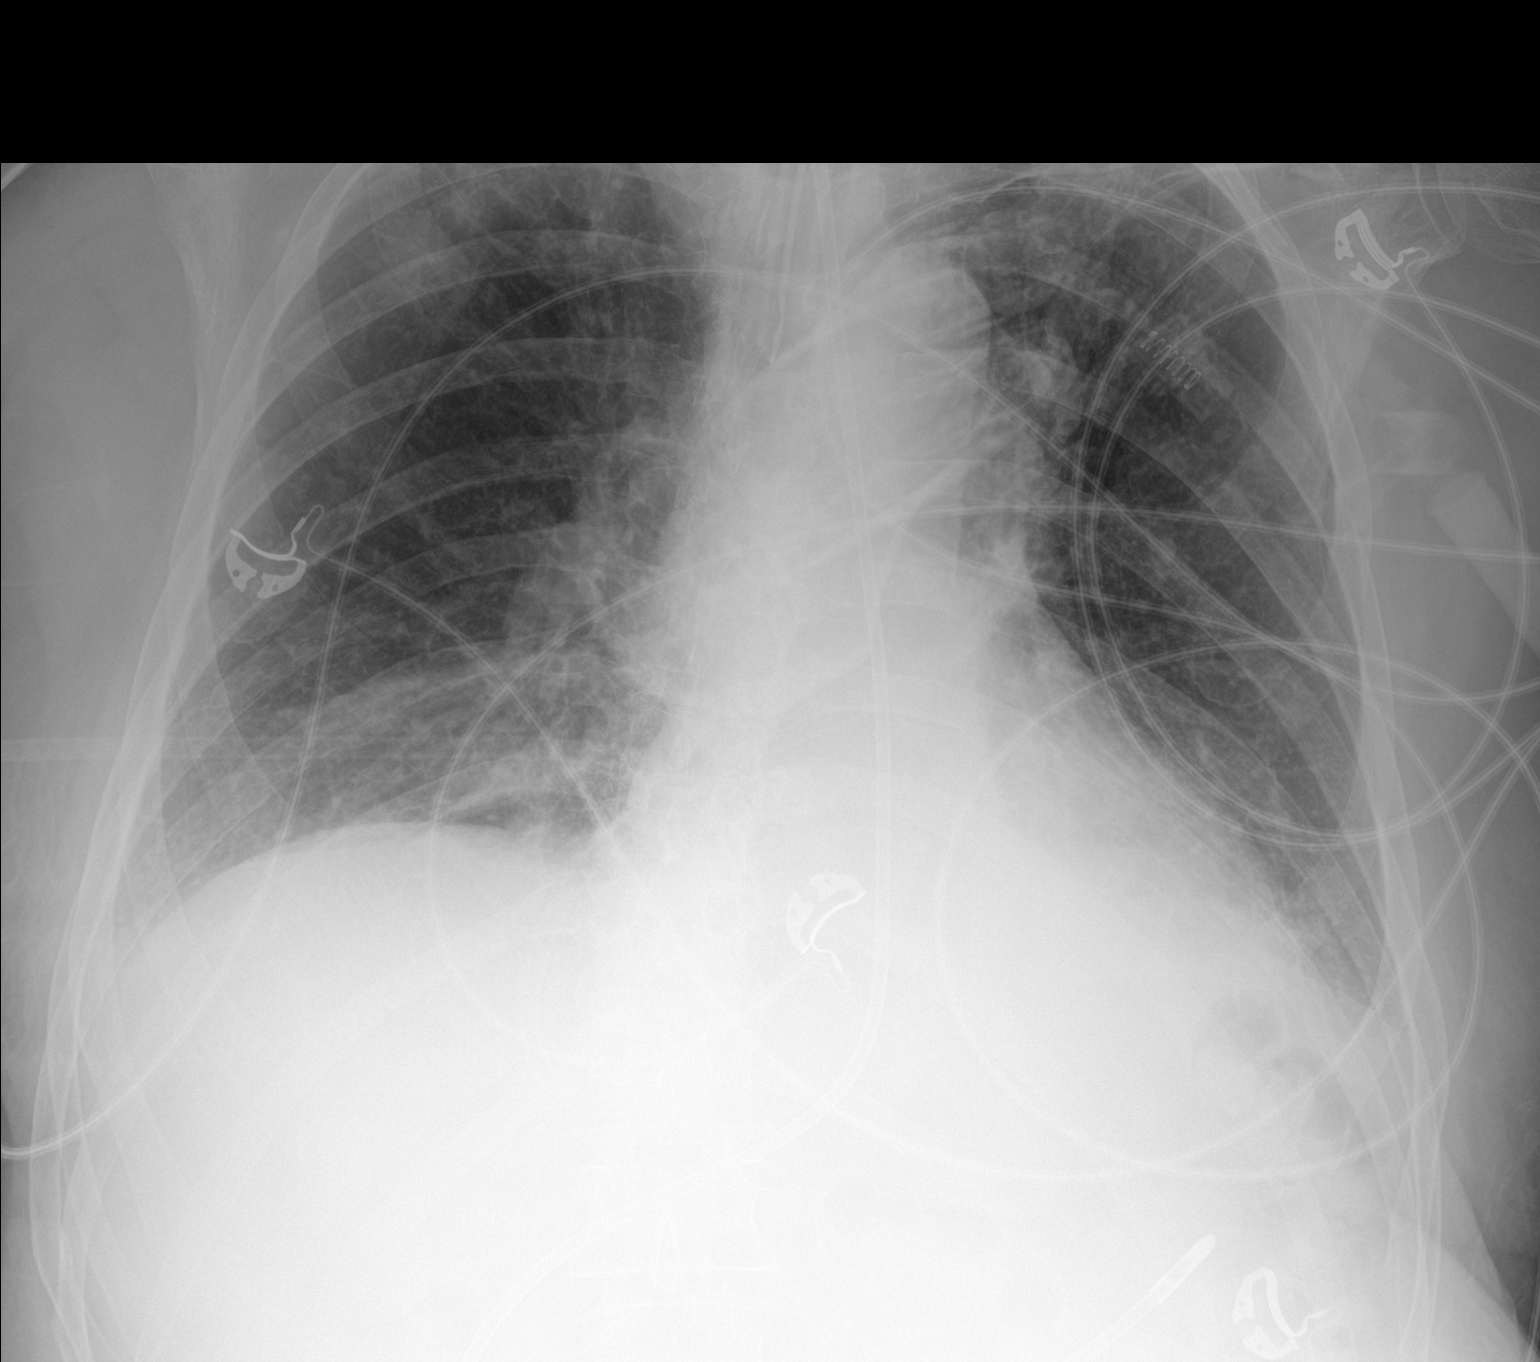

[1 of 1 positions shown; findings below may reference images not displayed]

FINDINGS: The tip of the endotracheal tube is approximately 2.5 cm above the
carina. There is mild airspace disease at the lung bases and in the
right upper lung most consistent with multifocal pneumonia. Feeding
tube extends into the left upper quadrant. There is more opacity at
the left lung base which may be due to atelectasis and/or pneumonia
with probable small effusions. Cardiomegaly is stable.
IMPRESSION: 1. Increasing airspace disease in the right upper lobe and at the
lung bases left-greater-than-right most consistent with multifocal
pneumonia. Suspect small effusions.
2. Tip of endotracheal tube 2.5 cm above the carina.
